# Patient Record
Sex: Male | Born: 1935 | Race: White | Hispanic: No | Smoking: Former smoker
Health system: Southern US, Community
[De-identification: ages and names within clinical notes are randomized; demographics above are authoritative.]

## PROBLEM LIST (undated history)

## (undated) DIAGNOSIS — K649 Unspecified hemorrhoids: Secondary | ICD-10-CM

## (undated) DIAGNOSIS — K224 Dyskinesia of esophagus: Secondary | ICD-10-CM

## (undated) DIAGNOSIS — Z8719 Personal history of other diseases of the digestive system: Secondary | ICD-10-CM

## (undated) DIAGNOSIS — I4891 Unspecified atrial fibrillation: Secondary | ICD-10-CM

## (undated) DIAGNOSIS — G4730 Sleep apnea, unspecified: Secondary | ICD-10-CM

## (undated) DIAGNOSIS — K219 Gastro-esophageal reflux disease without esophagitis: Secondary | ICD-10-CM

## (undated) DIAGNOSIS — C439 Malignant melanoma of skin, unspecified: Secondary | ICD-10-CM

## (undated) DIAGNOSIS — E785 Hyperlipidemia, unspecified: Secondary | ICD-10-CM

## (undated) DIAGNOSIS — F329 Major depressive disorder, single episode, unspecified: Secondary | ICD-10-CM

## (undated) DIAGNOSIS — F3289 Other specified depressive episodes: Secondary | ICD-10-CM

## (undated) DIAGNOSIS — K449 Diaphragmatic hernia without obstruction or gangrene: Secondary | ICD-10-CM

## (undated) DIAGNOSIS — K2990 Gastroduodenitis, unspecified, without bleeding: Secondary | ICD-10-CM

## (undated) DIAGNOSIS — K2980 Duodenitis without bleeding: Secondary | ICD-10-CM

## (undated) DIAGNOSIS — K589 Irritable bowel syndrome without diarrhea: Secondary | ICD-10-CM

## (undated) DIAGNOSIS — G609 Hereditary and idiopathic neuropathy, unspecified: Secondary | ICD-10-CM

## (undated) DIAGNOSIS — K2270 Barrett's esophagus without dysplasia: Secondary | ICD-10-CM

## (undated) DIAGNOSIS — I10 Essential (primary) hypertension: Secondary | ICD-10-CM

## (undated) DIAGNOSIS — D649 Anemia, unspecified: Secondary | ICD-10-CM

## (undated) DIAGNOSIS — F411 Generalized anxiety disorder: Secondary | ICD-10-CM

## (undated) DIAGNOSIS — K5730 Diverticulosis of large intestine without perforation or abscess without bleeding: Secondary | ICD-10-CM

## (undated) DIAGNOSIS — Z9889 Other specified postprocedural states: Secondary | ICD-10-CM

## (undated) DIAGNOSIS — K2970 Gastritis, unspecified, without bleeding: Secondary | ICD-10-CM

## (undated) DIAGNOSIS — Z7901 Long term (current) use of anticoagulants: Secondary | ICD-10-CM

## (undated) HISTORY — DX: Barrett's esophagus without dysplasia: K22.70

## (undated) HISTORY — PX: TURP VAPORIZATION: SUR1397

## (undated) HISTORY — DX: Personal history of other diseases of the digestive system: Z87.19

## (undated) HISTORY — DX: Duodenitis without bleeding: K29.80

## (undated) HISTORY — DX: Gastroduodenitis, unspecified, without bleeding: K29.90

## (undated) HISTORY — DX: Generalized anxiety disorder: F41.1

## (undated) HISTORY — DX: Long term (current) use of anticoagulants: Z79.01

## (undated) HISTORY — PX: ESOPHAGOGASTRODUODENOSCOPY: SHX1529

## (undated) HISTORY — PX: TONSILLECTOMY: SUR1361

## (undated) HISTORY — DX: Hereditary and idiopathic neuropathy, unspecified: G60.9

## (undated) HISTORY — DX: Dyskinesia of esophagus: K22.4

## (undated) HISTORY — DX: Gastritis, unspecified, without bleeding: K29.70

## (undated) HISTORY — DX: Sleep apnea, unspecified: G47.30

## (undated) HISTORY — DX: Diverticulosis of large intestine without perforation or abscess without bleeding: K57.30

## (undated) HISTORY — DX: Major depressive disorder, single episode, unspecified: F32.9

## (undated) HISTORY — PX: VASECTOMY: SHX75

## (undated) HISTORY — DX: Unspecified hemorrhoids: K64.9

## (undated) HISTORY — DX: Essential (primary) hypertension: I10

## (undated) HISTORY — DX: Unspecified atrial fibrillation: I48.91

## (undated) HISTORY — DX: Gastro-esophageal reflux disease without esophagitis: K21.9

## (undated) HISTORY — DX: Diaphragmatic hernia without obstruction or gangrene: K44.9

## (undated) HISTORY — DX: Anemia, unspecified: D64.9

## (undated) HISTORY — PX: LUMBAR LAMINECTOMY: SHX95

## (undated) HISTORY — PX: COLONOSCOPY: SHX174

## (undated) HISTORY — DX: Hyperlipidemia, unspecified: E78.5

## (undated) HISTORY — PX: OTHER SURGICAL HISTORY: SHX169

## (undated) HISTORY — DX: Other specified postprocedural states: Z98.890

## (undated) HISTORY — DX: Irritable bowel syndrome, unspecified: K58.9

## (undated) HISTORY — DX: Other specified depressive episodes: F32.89

## (undated) HISTORY — DX: Malignant melanoma of skin, unspecified: C43.9

## (undated) SURGERY — SIGMOIDOSCOPY, FLEXIBLE
Anesthesia: Moderate Sedation

---
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
## 1980-03-31 HISTORY — PX: NISSEN FUNDOPLICATION: SHX2091

## 1989-03-31 HISTORY — PX: CARDIAC CATHETERIZATION: SHX172

## 1998-10-25 ENCOUNTER — Encounter (INDEPENDENT_AMBULATORY_CARE_PROVIDER_SITE_OTHER): Payer: Self-pay | Admitting: Specialist

## 1998-10-25 ENCOUNTER — Other Ambulatory Visit: Admission: RE | Admit: 1998-10-25 | Discharge: 1998-10-25 | Payer: Self-pay | Admitting: Gastroenterology

## 2000-02-07 ENCOUNTER — Encounter (INDEPENDENT_AMBULATORY_CARE_PROVIDER_SITE_OTHER): Payer: Self-pay | Admitting: Specialist

## 2000-02-07 ENCOUNTER — Other Ambulatory Visit: Admission: RE | Admit: 2000-02-07 | Discharge: 2000-02-07 | Payer: Self-pay | Admitting: Gastroenterology

## 2000-02-08 ENCOUNTER — Emergency Department (HOSPITAL_COMMUNITY): Admission: EM | Admit: 2000-02-08 | Discharge: 2000-02-08 | Payer: Self-pay | Admitting: Emergency Medicine

## 2000-02-24 ENCOUNTER — Encounter: Payer: Self-pay | Admitting: Gastroenterology

## 2000-02-24 ENCOUNTER — Ambulatory Visit (HOSPITAL_COMMUNITY): Admission: RE | Admit: 2000-02-24 | Discharge: 2000-02-24 | Payer: Self-pay | Admitting: Gastroenterology

## 2001-02-01 ENCOUNTER — Encounter: Admission: RE | Admit: 2001-02-01 | Discharge: 2001-02-01 | Payer: Self-pay | Admitting: Internal Medicine

## 2001-02-01 ENCOUNTER — Encounter: Payer: Self-pay | Admitting: Internal Medicine

## 2001-02-23 ENCOUNTER — Ambulatory Visit (HOSPITAL_COMMUNITY): Admission: RE | Admit: 2001-02-23 | Discharge: 2001-02-23 | Payer: Self-pay | Admitting: Neurosurgery

## 2001-03-31 HISTORY — PX: CARPAL TUNNEL RELEASE: SHX101

## 2001-04-22 ENCOUNTER — Ambulatory Visit (HOSPITAL_COMMUNITY): Admission: RE | Admit: 2001-04-22 | Discharge: 2001-04-22 | Payer: Self-pay | Admitting: Neurosurgery

## 2001-05-19 ENCOUNTER — Encounter: Payer: Self-pay | Admitting: Gastroenterology

## 2001-05-19 ENCOUNTER — Ambulatory Visit (HOSPITAL_COMMUNITY): Admission: RE | Admit: 2001-05-19 | Discharge: 2001-05-19 | Payer: Self-pay | Admitting: Gastroenterology

## 2002-01-06 ENCOUNTER — Ambulatory Visit (HOSPITAL_COMMUNITY): Admission: RE | Admit: 2002-01-06 | Discharge: 2002-01-06 | Payer: Self-pay | Admitting: Neurosurgery

## 2002-01-15 ENCOUNTER — Emergency Department (HOSPITAL_COMMUNITY): Admission: EM | Admit: 2002-01-15 | Discharge: 2002-01-15 | Payer: Self-pay | Admitting: Emergency Medicine

## 2003-02-08 ENCOUNTER — Ambulatory Visit (HOSPITAL_COMMUNITY): Admission: RE | Admit: 2003-02-08 | Discharge: 2003-02-08 | Payer: Self-pay | Admitting: Cardiology

## 2003-04-19 ENCOUNTER — Encounter: Admission: RE | Admit: 2003-04-19 | Discharge: 2003-04-19 | Payer: Self-pay | Admitting: Orthopedic Surgery

## 2003-04-20 ENCOUNTER — Ambulatory Visit (HOSPITAL_COMMUNITY): Admission: RE | Admit: 2003-04-20 | Discharge: 2003-04-20 | Payer: Self-pay | Admitting: Orthopedic Surgery

## 2003-04-20 ENCOUNTER — Ambulatory Visit (HOSPITAL_BASED_OUTPATIENT_CLINIC_OR_DEPARTMENT_OTHER): Admission: RE | Admit: 2003-04-20 | Discharge: 2003-04-20 | Payer: Self-pay | Admitting: Orthopedic Surgery

## 2003-06-21 ENCOUNTER — Ambulatory Visit (HOSPITAL_COMMUNITY): Admission: RE | Admit: 2003-06-21 | Discharge: 2003-06-21 | Payer: Self-pay | Admitting: Cardiology

## 2003-06-21 ENCOUNTER — Ambulatory Visit (HOSPITAL_BASED_OUTPATIENT_CLINIC_OR_DEPARTMENT_OTHER): Admission: RE | Admit: 2003-06-21 | Discharge: 2003-06-21 | Payer: Self-pay | Admitting: Orthopedic Surgery

## 2003-06-26 ENCOUNTER — Ambulatory Visit (HOSPITAL_COMMUNITY): Admission: RE | Admit: 2003-06-26 | Discharge: 2003-06-26 | Payer: Self-pay | Admitting: Cardiology

## 2004-01-10 ENCOUNTER — Ambulatory Visit (HOSPITAL_COMMUNITY): Admission: RE | Admit: 2004-01-10 | Discharge: 2004-01-10 | Payer: Self-pay | Admitting: Cardiology

## 2004-02-19 ENCOUNTER — Ambulatory Visit: Payer: Self-pay | Admitting: Cardiology

## 2004-07-30 ENCOUNTER — Ambulatory Visit: Payer: Self-pay | Admitting: Cardiology

## 2004-07-31 ENCOUNTER — Ambulatory Visit: Payer: Self-pay | Admitting: Cardiology

## 2004-08-07 ENCOUNTER — Ambulatory Visit: Payer: Self-pay | Admitting: Gastroenterology

## 2004-08-23 ENCOUNTER — Ambulatory Visit: Payer: Self-pay | Admitting: Cardiology

## 2005-01-30 ENCOUNTER — Ambulatory Visit: Payer: Self-pay | Admitting: Cardiology

## 2005-07-29 ENCOUNTER — Ambulatory Visit: Payer: Self-pay | Admitting: Cardiology

## 2005-08-18 ENCOUNTER — Encounter: Payer: Self-pay | Admitting: Internal Medicine

## 2005-08-18 ENCOUNTER — Ambulatory Visit: Payer: Self-pay

## 2005-08-22 ENCOUNTER — Ambulatory Visit: Payer: Self-pay | Admitting: Gastroenterology

## 2005-09-09 ENCOUNTER — Ambulatory Visit: Payer: Self-pay | Admitting: Gastroenterology

## 2005-09-09 ENCOUNTER — Encounter (INDEPENDENT_AMBULATORY_CARE_PROVIDER_SITE_OTHER): Payer: Self-pay | Admitting: Specialist

## 2005-09-09 DIAGNOSIS — K2270 Barrett's esophagus without dysplasia: Secondary | ICD-10-CM | POA: Insufficient documentation

## 2005-10-06 ENCOUNTER — Ambulatory Visit: Payer: Self-pay | Admitting: Gastroenterology

## 2006-02-24 ENCOUNTER — Ambulatory Visit: Payer: Self-pay | Admitting: Cardiology

## 2006-05-25 ENCOUNTER — Ambulatory Visit: Payer: Self-pay | Admitting: Gastroenterology

## 2006-06-02 ENCOUNTER — Ambulatory Visit: Payer: Self-pay | Admitting: Gastroenterology

## 2006-07-08 ENCOUNTER — Ambulatory Visit: Payer: Self-pay | Admitting: Gastroenterology

## 2006-10-09 ENCOUNTER — Encounter: Payer: Self-pay | Admitting: Internal Medicine

## 2006-10-09 ENCOUNTER — Encounter: Admission: RE | Admit: 2006-10-09 | Discharge: 2006-10-09 | Payer: Self-pay | Admitting: Otolaryngology

## 2006-12-01 ENCOUNTER — Ambulatory Visit: Payer: Self-pay | Admitting: Internal Medicine

## 2006-12-03 ENCOUNTER — Ambulatory Visit: Payer: Self-pay | Admitting: Internal Medicine

## 2006-12-08 ENCOUNTER — Ambulatory Visit: Payer: Self-pay | Admitting: Internal Medicine

## 2006-12-09 ENCOUNTER — Ambulatory Visit (HOSPITAL_COMMUNITY): Admission: RE | Admit: 2006-12-09 | Discharge: 2006-12-09 | Payer: Self-pay | Admitting: Internal Medicine

## 2006-12-09 ENCOUNTER — Encounter: Admission: RE | Admit: 2006-12-09 | Discharge: 2006-12-09 | Payer: Self-pay | Admitting: Internal Medicine

## 2006-12-10 ENCOUNTER — Encounter: Admission: RE | Admit: 2006-12-10 | Discharge: 2006-12-10 | Payer: Self-pay | Admitting: Specialist

## 2006-12-15 ENCOUNTER — Ambulatory Visit (HOSPITAL_BASED_OUTPATIENT_CLINIC_OR_DEPARTMENT_OTHER): Admission: RE | Admit: 2006-12-15 | Discharge: 2006-12-15 | Payer: Self-pay | Admitting: Specialist

## 2007-01-14 ENCOUNTER — Ambulatory Visit: Payer: Self-pay | Admitting: Internal Medicine

## 2007-02-02 ENCOUNTER — Ambulatory Visit: Payer: Self-pay | Admitting: Cardiology

## 2007-06-04 DIAGNOSIS — K589 Irritable bowel syndrome without diarrhea: Secondary | ICD-10-CM

## 2007-06-04 DIAGNOSIS — F411 Generalized anxiety disorder: Secondary | ICD-10-CM | POA: Insufficient documentation

## 2007-06-04 DIAGNOSIS — I10 Essential (primary) hypertension: Secondary | ICD-10-CM

## 2007-06-04 DIAGNOSIS — F329 Major depressive disorder, single episode, unspecified: Secondary | ICD-10-CM

## 2007-06-04 DIAGNOSIS — E785 Hyperlipidemia, unspecified: Secondary | ICD-10-CM

## 2007-08-10 ENCOUNTER — Ambulatory Visit: Payer: Self-pay

## 2007-08-10 ENCOUNTER — Encounter: Payer: Self-pay | Admitting: Cardiology

## 2007-08-12 ENCOUNTER — Ambulatory Visit: Payer: Self-pay | Admitting: Cardiology

## 2007-08-16 ENCOUNTER — Ambulatory Visit: Payer: Self-pay | Admitting: Cardiology

## 2007-08-16 LAB — CONVERTED CEMR LAB
AST: 30 units/L (ref 0–37)
Albumin: 3.9 g/dL (ref 3.5–5.2)
BUN: 17 mg/dL (ref 6–23)
Basophils Relative: 0.8 % (ref 0.0–1.0)
Bilirubin, Direct: 0.1 mg/dL (ref 0.0–0.3)
CO2: 31 meq/L (ref 19–32)
Calcium: 9.2 mg/dL (ref 8.4–10.5)
Creatinine, Ser: 0.9 mg/dL (ref 0.4–1.5)
Eosinophils Relative: 3.7 % (ref 0.0–5.0)
GFR calc Af Amer: 105 mL/min
GFR calc non Af Amer: 87 mL/min
HCT: 42.4 % (ref 39.0–52.0)
Hemoglobin: 14.2 g/dL (ref 13.0–17.0)
Lymphocytes Relative: 28.3 % (ref 12.0–46.0)
Monocytes Relative: 8 % (ref 3.0–12.0)
Platelets: 250 10*3/uL (ref 150–400)
Potassium: 4.5 meq/L (ref 3.5–5.1)
RBC: 4.34 M/uL (ref 4.22–5.81)
Sodium: 143 meq/L (ref 135–145)
TSH: 0.55 microintl units/mL (ref 0.35–5.50)
Total Protein: 6.3 g/dL (ref 6.0–8.3)
WBC: 4.3 10*3/uL — ABNORMAL LOW (ref 4.5–10.5)

## 2007-08-24 ENCOUNTER — Ambulatory Visit: Payer: Self-pay | Admitting: Cardiology

## 2007-09-03 ENCOUNTER — Ambulatory Visit: Payer: Self-pay | Admitting: Cardiovascular Disease

## 2007-09-13 ENCOUNTER — Ambulatory Visit: Payer: Self-pay | Admitting: Internal Medicine

## 2007-09-21 ENCOUNTER — Ambulatory Visit: Payer: Self-pay | Admitting: Cardiology

## 2007-09-27 ENCOUNTER — Ambulatory Visit: Payer: Self-pay | Admitting: Cardiology

## 2007-10-19 ENCOUNTER — Ambulatory Visit: Payer: Self-pay | Admitting: Cardiology

## 2007-10-28 ENCOUNTER — Ambulatory Visit: Payer: Self-pay | Admitting: Cardiology

## 2007-11-02 ENCOUNTER — Ambulatory Visit: Payer: Self-pay | Admitting: Cardiology

## 2007-11-04 ENCOUNTER — Ambulatory Visit: Payer: Self-pay | Admitting: Internal Medicine

## 2007-11-15 ENCOUNTER — Ambulatory Visit: Payer: Self-pay | Admitting: Cardiology

## 2007-11-22 ENCOUNTER — Ambulatory Visit: Payer: Self-pay | Admitting: Internal Medicine

## 2007-11-23 ENCOUNTER — Ambulatory Visit: Payer: Self-pay | Admitting: Cardiology

## 2007-12-02 ENCOUNTER — Ambulatory Visit: Payer: Self-pay | Admitting: Internal Medicine

## 2007-12-23 ENCOUNTER — Ambulatory Visit: Payer: Self-pay | Admitting: Cardiology

## 2007-12-30 ENCOUNTER — Inpatient Hospital Stay (HOSPITAL_COMMUNITY): Admission: RE | Admit: 2007-12-30 | Discharge: 2008-01-03 | Payer: Self-pay | Admitting: Specialist

## 2007-12-30 ENCOUNTER — Encounter: Payer: Self-pay | Admitting: Cardiology

## 2008-01-11 ENCOUNTER — Encounter: Admission: RE | Admit: 2008-01-11 | Discharge: 2008-01-11 | Payer: Self-pay | Admitting: Specialist

## 2008-01-24 ENCOUNTER — Ambulatory Visit: Payer: Self-pay | Admitting: Cardiology

## 2008-01-31 ENCOUNTER — Encounter (INDEPENDENT_AMBULATORY_CARE_PROVIDER_SITE_OTHER): Payer: Self-pay | Admitting: Specialist

## 2008-01-31 ENCOUNTER — Ambulatory Visit: Payer: Self-pay | Admitting: Vascular Surgery

## 2008-01-31 ENCOUNTER — Ambulatory Visit: Payer: Self-pay | Admitting: Cardiovascular Disease

## 2008-01-31 ENCOUNTER — Ambulatory Visit: Admission: RE | Admit: 2008-01-31 | Discharge: 2008-01-31 | Payer: Self-pay | Admitting: Specialist

## 2008-02-21 ENCOUNTER — Encounter: Admission: RE | Admit: 2008-02-21 | Discharge: 2008-02-21 | Payer: Self-pay | Admitting: Specialist

## 2008-02-29 ENCOUNTER — Ambulatory Visit: Payer: Self-pay | Admitting: Cardiovascular Disease

## 2008-03-01 ENCOUNTER — Ambulatory Visit: Payer: Self-pay | Admitting: Internal Medicine

## 2008-03-01 ENCOUNTER — Encounter: Admission: RE | Admit: 2008-03-01 | Discharge: 2008-03-01 | Payer: Self-pay | Admitting: Specialist

## 2008-03-03 ENCOUNTER — Ambulatory Visit: Payer: Self-pay | Admitting: Cardiology

## 2008-03-09 ENCOUNTER — Ambulatory Visit: Payer: Self-pay | Admitting: Cardiology

## 2008-03-16 ENCOUNTER — Ambulatory Visit: Payer: Self-pay | Admitting: Cardiology

## 2008-03-21 ENCOUNTER — Ambulatory Visit: Payer: Self-pay | Admitting: Cardiovascular Disease

## 2008-04-05 ENCOUNTER — Ambulatory Visit: Payer: Self-pay | Admitting: Cardiology

## 2008-04-11 ENCOUNTER — Ambulatory Visit: Payer: Self-pay | Admitting: Cardiovascular Disease

## 2008-04-25 ENCOUNTER — Ambulatory Visit: Payer: Self-pay | Admitting: Cardiology

## 2008-05-09 ENCOUNTER — Ambulatory Visit: Payer: Self-pay | Admitting: Cardiology

## 2008-05-18 ENCOUNTER — Ambulatory Visit: Payer: Self-pay | Admitting: Internal Medicine

## 2008-05-25 ENCOUNTER — Ambulatory Visit: Payer: Self-pay | Admitting: Internal Medicine

## 2008-06-06 ENCOUNTER — Ambulatory Visit: Payer: Self-pay | Admitting: Cardiology

## 2008-06-15 ENCOUNTER — Ambulatory Visit: Payer: Self-pay | Admitting: Internal Medicine

## 2008-06-20 ENCOUNTER — Ambulatory Visit: Payer: Self-pay | Admitting: Cardiovascular Disease

## 2008-06-26 ENCOUNTER — Ambulatory Visit: Payer: Self-pay | Admitting: Cardiology

## 2008-06-28 ENCOUNTER — Ambulatory Visit: Payer: Self-pay | Admitting: Internal Medicine

## 2008-06-28 ENCOUNTER — Telehealth: Payer: Self-pay | Admitting: Internal Medicine

## 2008-06-28 DIAGNOSIS — K224 Dyskinesia of esophagus: Secondary | ICD-10-CM | POA: Insufficient documentation

## 2008-07-06 ENCOUNTER — Encounter: Payer: Self-pay | Admitting: Internal Medicine

## 2008-07-10 ENCOUNTER — Ambulatory Visit: Payer: Self-pay | Admitting: Internal Medicine

## 2008-07-11 DIAGNOSIS — G4730 Sleep apnea, unspecified: Secondary | ICD-10-CM | POA: Insufficient documentation

## 2008-07-12 ENCOUNTER — Encounter: Payer: Self-pay | Admitting: Cardiology

## 2008-07-12 ENCOUNTER — Ambulatory Visit: Payer: Self-pay | Admitting: Cardiology

## 2008-07-20 ENCOUNTER — Ambulatory Visit: Payer: Self-pay | Admitting: Internal Medicine

## 2008-07-20 ENCOUNTER — Encounter: Payer: Self-pay | Admitting: Internal Medicine

## 2008-07-25 ENCOUNTER — Encounter: Payer: Self-pay | Admitting: Internal Medicine

## 2008-07-31 ENCOUNTER — Ambulatory Visit: Payer: Self-pay | Admitting: Cardiology

## 2008-08-10 ENCOUNTER — Ambulatory Visit: Payer: Self-pay | Admitting: Cardiovascular Disease

## 2008-08-30 ENCOUNTER — Encounter: Payer: Self-pay | Admitting: *Deleted

## 2008-08-31 ENCOUNTER — Ambulatory Visit: Payer: Self-pay | Admitting: Internal Medicine

## 2008-09-28 ENCOUNTER — Ambulatory Visit: Payer: Self-pay | Admitting: Cardiology

## 2008-10-04 ENCOUNTER — Encounter: Payer: Self-pay | Admitting: *Deleted

## 2008-10-04 ENCOUNTER — Telehealth: Payer: Self-pay | Admitting: Cardiology

## 2008-10-16 ENCOUNTER — Ambulatory Visit: Payer: Self-pay | Admitting: Cardiology

## 2008-10-16 LAB — CONVERTED CEMR LAB: POC INR: 1.6

## 2008-10-26 ENCOUNTER — Ambulatory Visit: Payer: Self-pay | Admitting: Cardiovascular Disease

## 2008-10-26 LAB — CONVERTED CEMR LAB: Prothrombin Time: 18.8 s

## 2008-10-27 ENCOUNTER — Encounter: Payer: Self-pay | Admitting: Internal Medicine

## 2008-10-27 ENCOUNTER — Encounter: Payer: Self-pay | Admitting: Cardiology

## 2008-11-06 ENCOUNTER — Ambulatory Visit: Payer: Self-pay | Admitting: Cardiology

## 2008-11-16 ENCOUNTER — Telehealth: Payer: Self-pay | Admitting: Cardiology

## 2008-11-21 ENCOUNTER — Telehealth: Payer: Self-pay | Admitting: Internal Medicine

## 2008-12-05 ENCOUNTER — Encounter: Payer: Self-pay | Admitting: Cardiology

## 2008-12-05 ENCOUNTER — Ambulatory Visit: Payer: Self-pay | Admitting: Cardiology

## 2008-12-05 LAB — CONVERTED CEMR LAB: POC INR: 1.5

## 2008-12-19 ENCOUNTER — Ambulatory Visit: Payer: Self-pay | Admitting: Cardiology

## 2008-12-19 LAB — CONVERTED CEMR LAB: POC INR: 2.9

## 2009-01-16 ENCOUNTER — Ambulatory Visit: Payer: Self-pay | Admitting: Cardiology

## 2009-01-17 ENCOUNTER — Telehealth: Payer: Self-pay | Admitting: Cardiology

## 2009-01-24 ENCOUNTER — Ambulatory Visit: Payer: Self-pay | Admitting: Cardiology

## 2009-01-24 DIAGNOSIS — I080 Rheumatic disorders of both mitral and aortic valves: Secondary | ICD-10-CM

## 2009-01-25 ENCOUNTER — Ambulatory Visit: Payer: Self-pay | Admitting: Cardiology

## 2009-02-02 ENCOUNTER — Ambulatory Visit: Payer: Self-pay | Admitting: Cardiovascular Disease

## 2009-02-02 LAB — CONVERTED CEMR LAB: POC INR: 1.8

## 2009-02-16 ENCOUNTER — Ambulatory Visit: Payer: Self-pay | Admitting: Cardiology

## 2009-02-28 DEATH — deceased

## 2009-03-02 ENCOUNTER — Ambulatory Visit: Payer: Self-pay | Admitting: Cardiovascular Disease

## 2009-03-12 ENCOUNTER — Encounter: Payer: Self-pay | Admitting: Cardiology

## 2009-03-16 ENCOUNTER — Ambulatory Visit: Payer: Self-pay | Admitting: Cardiovascular Disease

## 2009-03-16 LAB — CONVERTED CEMR LAB: POC INR: 2.4

## 2009-04-12 ENCOUNTER — Ambulatory Visit: Payer: Self-pay | Admitting: Internal Medicine

## 2009-04-12 LAB — CONVERTED CEMR LAB: POC INR: 2.5

## 2009-04-26 ENCOUNTER — Telehealth: Payer: Self-pay | Admitting: Cardiology

## 2009-05-14 ENCOUNTER — Ambulatory Visit: Payer: Self-pay | Admitting: Cardiology

## 2009-05-14 LAB — CONVERTED CEMR LAB: POC INR: 2.7

## 2009-06-12 ENCOUNTER — Ambulatory Visit: Payer: Self-pay | Admitting: Cardiology

## 2009-06-29 ENCOUNTER — Ambulatory Visit: Payer: Self-pay | Admitting: Internal Medicine

## 2009-07-03 ENCOUNTER — Telehealth: Payer: Self-pay | Admitting: Cardiology

## 2009-07-09 ENCOUNTER — Ambulatory Visit: Payer: Self-pay | Admitting: Cardiovascular Disease

## 2009-07-16 ENCOUNTER — Ambulatory Visit: Payer: Self-pay | Admitting: Cardiology

## 2009-07-31 ENCOUNTER — Ambulatory Visit: Payer: Self-pay | Admitting: Cardiology

## 2009-07-31 ENCOUNTER — Encounter: Payer: Self-pay | Admitting: Cardiology

## 2009-07-31 ENCOUNTER — Ambulatory Visit: Payer: Self-pay | Admitting: Cardiovascular Disease

## 2009-07-31 ENCOUNTER — Ambulatory Visit: Payer: Self-pay

## 2009-07-31 ENCOUNTER — Ambulatory Visit (HOSPITAL_COMMUNITY): Admission: RE | Admit: 2009-07-31 | Discharge: 2009-07-31 | Payer: Self-pay | Admitting: Cardiology

## 2009-07-31 LAB — CONVERTED CEMR LAB: POC INR: 3.1

## 2009-08-13 ENCOUNTER — Ambulatory Visit: Payer: Self-pay | Admitting: Cardiovascular Disease

## 2009-08-13 LAB — CONVERTED CEMR LAB: POC INR: 2.5

## 2009-09-03 ENCOUNTER — Ambulatory Visit: Payer: Self-pay | Admitting: Cardiology

## 2009-09-03 LAB — CONVERTED CEMR LAB: POC INR: 2.1

## 2009-10-02 ENCOUNTER — Ambulatory Visit: Payer: Self-pay | Admitting: Internal Medicine

## 2009-10-02 LAB — CONVERTED CEMR LAB: POC INR: 2.1

## 2009-11-09 ENCOUNTER — Ambulatory Visit: Payer: Self-pay | Admitting: Internal Medicine

## 2009-12-07 ENCOUNTER — Ambulatory Visit: Payer: Self-pay | Admitting: Internal Medicine

## 2009-12-07 LAB — CONVERTED CEMR LAB: POC INR: 3.3

## 2009-12-10 ENCOUNTER — Ambulatory Visit: Payer: Self-pay | Admitting: Cardiology

## 2009-12-10 DIAGNOSIS — R609 Edema, unspecified: Secondary | ICD-10-CM

## 2009-12-14 ENCOUNTER — Telehealth: Payer: Self-pay | Admitting: Cardiology

## 2009-12-20 ENCOUNTER — Ambulatory Visit: Payer: Self-pay | Admitting: Cardiology

## 2009-12-20 DIAGNOSIS — I5032 Chronic diastolic (congestive) heart failure: Secondary | ICD-10-CM | POA: Insufficient documentation

## 2009-12-20 DIAGNOSIS — I4891 Unspecified atrial fibrillation: Secondary | ICD-10-CM | POA: Insufficient documentation

## 2009-12-21 LAB — CONVERTED CEMR LAB
Calcium: 9.2 mg/dL (ref 8.4–10.5)
Creatinine, Ser: 0.9 mg/dL (ref 0.4–1.5)
Sodium: 143 meq/L (ref 135–145)

## 2010-01-07 ENCOUNTER — Ambulatory Visit: Payer: Self-pay | Admitting: Cardiology

## 2010-01-07 LAB — CONVERTED CEMR LAB: POC INR: 2.3

## 2010-01-09 LAB — CONVERTED CEMR LAB
Calcium: 8.9 mg/dL (ref 8.4–10.5)
GFR calc non Af Amer: 82.99 mL/min (ref >60)
Potassium: 3.7 meq/L (ref 3.5–5.1)
Sodium: 140 meq/L (ref 135–145)

## 2010-01-11 ENCOUNTER — Ambulatory Visit: Payer: Self-pay | Admitting: Cardiology

## 2010-01-11 DIAGNOSIS — R7309 Other abnormal glucose: Secondary | ICD-10-CM | POA: Insufficient documentation

## 2010-01-11 LAB — CONVERTED CEMR LAB: Glucose, Bld: 76 mg/dL (ref 70–99)

## 2010-01-12 ENCOUNTER — Encounter: Admission: RE | Admit: 2010-01-12 | Discharge: 2010-01-12 | Payer: Self-pay | Admitting: Specialist

## 2010-01-28 ENCOUNTER — Telehealth: Payer: Self-pay | Admitting: Cardiology

## 2010-01-30 ENCOUNTER — Ambulatory Visit: Payer: Self-pay | Admitting: Cardiology

## 2010-01-31 ENCOUNTER — Encounter: Payer: Self-pay | Admitting: Cardiology

## 2010-02-11 ENCOUNTER — Ambulatory Visit: Payer: Self-pay | Admitting: Internal Medicine

## 2010-02-11 LAB — CONVERTED CEMR LAB: POC INR: 1.3

## 2010-02-18 ENCOUNTER — Ambulatory Visit: Payer: Self-pay | Admitting: Cardiovascular Disease

## 2010-02-18 LAB — CONVERTED CEMR LAB: POC INR: 2.3

## 2010-02-26 ENCOUNTER — Encounter: Payer: Self-pay | Admitting: Cardiology

## 2010-03-18 ENCOUNTER — Ambulatory Visit: Payer: Self-pay | Admitting: Internal Medicine

## 2010-04-05 ENCOUNTER — Ambulatory Visit: Admission: RE | Admit: 2010-04-05 | Discharge: 2010-04-05 | Payer: Self-pay | Source: Home / Self Care

## 2010-04-05 ENCOUNTER — Encounter: Payer: Self-pay | Admitting: Cardiology

## 2010-04-05 ENCOUNTER — Ambulatory Visit
Admission: RE | Admit: 2010-04-05 | Discharge: 2010-04-05 | Payer: Self-pay | Source: Home / Self Care | Attending: Cardiology | Admitting: Cardiology

## 2010-04-05 LAB — CONVERTED CEMR LAB: POC INR: 1.4

## 2010-04-19 ENCOUNTER — Ambulatory Visit: Admit: 2010-04-19 | Payer: Self-pay

## 2010-04-22 ENCOUNTER — Encounter: Payer: Self-pay | Admitting: Internal Medicine

## 2010-04-22 ENCOUNTER — Ambulatory Visit
Admission: RE | Admit: 2010-04-22 | Discharge: 2010-04-22 | Payer: Self-pay | Source: Home / Self Care | Attending: Cardiology | Admitting: Cardiology

## 2010-04-30 NOTE — Miscellaneous (Signed)
Summary: Orders Update  Clinical Lists Changes  Orders: Added new Test order of TLB-BMP (Basic Metabolic Panel-BMET) (80048-METABOL) - Signed 

## 2010-04-30 NOTE — Progress Notes (Signed)
Summary: Pt have question about medication  Phone Note Call from Patient Call back at [phone]   Caller: Patient Summary of Call: Pt have question about medication Initial call taken by: Judie Grieve,  December 14, 2009 11:04 AM  Follow-up for Phone Call        Spoke with pt. Patient states on his last visit with Dr. Riley Kill Md gave  him 2 weeks worth on samples of Atacand HCT 16-12.5 mg. Pt. would like to know when is better for him to take this medication . I adviced pt. it will be better for him to take medication in the AM due that medication has a diuretic component. Pt. verbalized understanding. Follow-up by: Ollen Gross, RN, BSN,  December 14, 2009 11:51 AM

## 2010-04-30 NOTE — Medication Information (Signed)
Summary: rov coumadin - rov  Anticoagulant Therapy  Managed by: Bethena Midget, RN, BSN Referring MD: Shawnie Pons MD PCP: Ralene Ok, MD Supervising MD: Shirlee Latch MD, Romey Cohea Indication 1: Atrial Fibrillation (ICD-427.31) Lab Used: [facility] Site: [facility] INR POC 2.7 INR RANGE 2 - 3  Dietary changes: no    Health status changes: no    Bleeding/hemorrhagic complications: no    Recent/future hospitalizations: no    Any changes in medication regimen? no    Recent/future dental: no  Any missed doses?: no       Is patient compliant with meds? yes       Allergies: 1)  ! * Horse Serum  Anticoagulation Management History:      The patient is taking warfarin and comes in today for a routine follow up visit.  Positive risk factors for bleeding include an age of 75 years or older.  The bleeding index is 'intermediate risk'.  Positive CHADS2 values include History of HTN and Age > 31 years old.  The start date was 08/09/2007.  Anticoagulation responsible provider: Shirlee Latch MD, Tiffany Talarico.  INR POC: 2.7.  Cuvette Lot#: [id number].  Exp: 06/2009.    Anticoagulation Management Assessment/Plan:      The patient's current anticoagulation dose is Coumadin 5 mg tabs: Take as directed by coumadin clinic..  The target INR is 2 - 3.  The next INR is due 06/11/2009.  Anticoagulation instructions were given to patient.  Results were reviewed/authorized by Bethena Midget, RN, BSN.  He was notified by Bethena Midget, RN, BSN.         Prior Anticoagulation Instructions: INR 2.5  Continue taking same dose of 1 tablet daily except 0.5 tablet on Wednesdays. Recheck in 4 weeks.  Current Anticoagulation Instructions: INR 2.7 Continue 5mg s daily except 2.5mg s on Wednesdays. Recheck in 4 weeks.

## 2010-04-30 NOTE — Miscellaneous (Signed)
Summary: Orders Update  Clinical Lists Changes  Problems: Added new problem of CHF (ICD-428.0) Orders: Added new Test order of TLB-BMP (Basic Metabolic Panel-BMET) (80048-METABOL) - Signed

## 2010-04-30 NOTE — Progress Notes (Signed)
Summary: Question  Phone Note Call from Patient Call back at Home Phone [phone]   Caller: Patient Reason for Call: Talk to Nurse Details for Reason: personal call, no info  Initial call taken by: Lorne Skeens,  April 26, 2009 8:40 AM  Follow-up for Phone Call        I spoke with the pt and his wife passed away on 04-03-2009.  The pt is scheduled to have an epidural injection on Monday with Dr Otelia Sergeant.  The pt did start holding his coumadin yesterday.  The pt wanted to get our thoughts on wether he should proceed with injection at this time.  The pt has noticed that he has had more palpitations since his wife passed away.  The pt would like to post-pone this injection and restart his coumadin.  I told the pt that this would be appropriate.  The pt does monitor his BP and HR at home and these have been okay.  I asked the pt to call our office if he had any other problems. Follow-up by: Julieta Gutting, RN, BSN,  April 26, 2009 9:40 AM

## 2010-04-30 NOTE — Medication Information (Signed)
Summary: rov/ej  Anticoagulant Therapy  Managed by: Earvin Hansen, PharmD Referring MD: Shawnie Pons MD PCP: Ralene Ok, MD Supervising MD: Ladona Ridgel MD, Sharlot Gowda Indication 1: Atrial Fibrillation (ICD-427.31) Lab Used: [facility] Site: [facility] INR POC 1.3 INR RANGE 2 - 3  Dietary changes: no    Health status changes: no    Bleeding/hemorrhagic complications: no    Recent/future hospitalizations: no    Any changes in medication regimen? yes    Recent/future dental: no  Any missed doses?: yes     Details: Missed the last 5 days because receiving an epideral today   Comments: Patient was instructed by his cardiologist told him to hold it for 5 days prior to the epideral today. His procedure is at 3:30 today.  Allergies: 1)  ! * Horse Serum  Anticoagulation Management History:      Positive risk factors for bleeding include an age of 31 years or older.  The bleeding index is 'intermediate risk'.  Positive CHADS2 values include History of CHF, History of HTN, and Age > 75 years old.  The start date was 08/09/2007.  His last INR was 2.1.  Anticoagulation responsible provider: Ladona Ridgel MD, Sharlot Gowda.  INR POC: 1.3.  Exp: 01/2011.    Anticoagulation Management Assessment/Plan:      The patient's current anticoagulation dose is Coumadin 5 mg tabs: Take as directed by coumadin clinic..  The target INR is 2 - 3.  The next INR is due 02/18/2010.  Anticoagulation instructions were given to patient.  Results were reviewed/authorized by Earvin Hansen, PharmD.  He was notified by Earvin Hansen PharmD.         Prior Anticoagulation Instructions: INR 2.3  Continue Coumadin 1 tablet every day of the week, except 1/2 tablet on Wednesday.  Return to clinic in 4 weeks.   Current Anticoagulation Instructions: Take 2 tablets this evening. Then continue on regular regimen of 1 tablet daily except for 1/2 tablet on Thursday.

## 2010-04-30 NOTE — Assessment & Plan Note (Signed)
Summary: ROV   Visit Type:  Follow-up Primary Provider:  Ralene Ok, MD  CC:  Biilateral leg edema and pain. Swelling on feet better now.  History of Present Illness: His swelling has improved quite a bit.  It was getting uncomfortable, and is now alot better than it was.  He thinks his elevated sugar was about too much Rum and coke.  We discussed use of Atenolol today in light of glucose.  Still has back pain, and that helps, but he had to get off of oxycodone.  Now on Gabapentin.  BP at home remain a little bit high.  Dr. Ludwig Clarks told him he would like to keep it under 140.  Current Medications (verified): 1)  Atenolol 50 Mg Tabs (Atenolol) .... Take 1 Tablet By Mouth Once A Day 2)  Prilosec 20 Mg Cpdr (Omeprazole) .... Take 1 Tablet By Mouth Once A Day 3)  Pravastatin Sodium 10 Mg Tabs (Pravastatin Sodium) .... Take 1 Tablet By Mouth Once A Day 4)  Vitamin E 400 Unit Caps (Vitamin E) .... Take 1 Tablet By Mouth Once A Day 5)  Flax Seed Oil 1000 Mg Caps (Flaxseed (Linseed)) .... Take 2 Tablet By Mouth Once A Day 6)  Coumadin 5 Mg Tabs (Warfarin Sodium) .... Take As Directed By Coumadin Clinic. 7)  Vitamin D 1000 Unit Tabs (Cholecalciferol) .... One Tablet Once Daily 8)  Calcium 600 Mg Tabs (Calcium) .... Every Other Day 9)  B Complex  Tabs (B Complex Vitamins) .... Take 1 Tablet By Mouth Once A Day 10)  Vitamin C 500 Mg  Tabs (Ascorbic Acid) .... About 2 O 3 A Week 11)  Atacand Hct 16-12.5 Mg Tabs (Candesartan Cilexetil-Hctz) .... Take 1 Tablet Daily 12)  Gabapentin 300 Mg  Caps (Gabapentin) .... 2 Am and 2 Pm 13)  Tramadol Hcl 50 Mg  Tabs (Tramadol Hcl) .... As Needed 14)  Fish Oil 1000 Mg Caps (Omega-3 Fatty Acids) .Marland Kitchen.. 1 Cap Two Times A Day  Allergies: 1)  ! * Horse Serum  Vital Signs:  Patient profile:   77 year old male Height:      72 inches Weight:      183.50 pounds BMI:     24.98 Pulse rate:   88 / minute Pulse rhythm:   irregular Resp:     18 per minute BP sitting:    150 / 90  (left arm) Cuff size:   large  Vitals Entered By: Vikki Ports (January 11, 2010 10:13 AM)  Physical Exam  General:  Well developed, well nourished, in no acute distress. Head:  normocephalic and atraumatic Eyes:  PERRLA/EOM intact; conjunctiva and lids normal. Lungs:  Clear bilaterally to auscultation and percussion. Heart:  PMI non displaced.  Soft apical murmur.  Irreg irreg rate.   Abdomen:  Bowel sounds positive; abdomen soft and non-tender without masses, organomegaly, or hernias noted. No hepatosplenomegaly. Extremities:  trace edema bilaterally Neurologic:  Alert and oriented x 3.   Impression & Recommendations:  Problem # 1:  HYPERTENSION (ICD-401.9) BP are staying high.  Therefore, will change atenolol to carvedilol and titrate up, see back in early follow up to assess response.  Told him to call if problems given need for AF control as well.   The following medications were removed from the medication list:    Atacand Hct 16-12.5 Mg Tabs (Candesartan cilexetil-hctz) ..... Once daily His updated medication list for this problem includes:    Carvedilol 6.25 Mg Tabs (Carvedilol) .Marland Kitchen... Take  one tablet by mouth twice a day    Atacand Hct 16-12.5 Mg Tabs (Candesartan cilexetil-hctz) .Marland Kitchen... Take 1 tablet daily  Problem # 2:  EDEMA (ICD-782.3) Much improved off of Amlodipine.    Problem # 3:  ATRIAL FIBRILLATION (ICD-427.31)  controlled at present.  Changing rate blocking drug, so will need to monitor. His updated medication list for this problem includes:    Carvedilol 6.25 Mg Tabs (Carvedilol) .Marland Kitchen... Take one tablet by mouth twice a day    Coumadin 5 Mg Tabs (Warfarin sodium) .Marland Kitchen... Take as directed by coumadin clinic.  Orders: TLB-Glucose, QUANT ([id number]-GLU)  His updated medication list for this problem includes:    Atenolol 50 Mg Tabs (Atenolol) .Marland Kitchen... Take 1 tablet by mouth once a day    Coumadin 5 Mg Tabs (Warfarin sodium) .Marland Kitchen... Take as directed by coumadin  clinic.  Problem # 4:  MITRAL REGURGITATION (ICD-396.3) stable  Problem # 5:  HYPERGLYCEMIA (ICD-790.29) recheck labs today.  Add K to regimen based on last K of 3.7.  Patient Instructions: 1)  Your physician recommends that you schedule a follow-up appointment in: 2 WEEKS (pt will call for appt) 2)  Your physician recommends that you have lab work today: fasting glucose 3)  Your physician has recommended you make the following change in your medication: START Potassium Chloride once a day, STOP Atenolol, START Carvedilol 6.25mg  take one-half tablet by mouth two times a day for 2 days then increase to one tablet by mouth two times a day  Prescriptions: POTASSIUM CHLORIDE CR 10 MEQ CR-CAPS (POTASSIUM CHLORIDE) Take one tablet by mouth daily  #30 x 6   Entered by:   Julieta Gutting, RN, BSN   Authorized by:   Ronaldo Miyamoto, MD, [facility]   Signed by:   Julieta Gutting, RN, BSN on 01/11/2010   Method used:   Electronically to        [facility]  [phone]* (retail)       [address] [place], [place]  [address]       Ph: [phone] or [phone]       Fax: [phone]   RxID:   [id number] CARVEDILOL 6.25 MG TABS (CARVEDILOL) Take one tablet by mouth twice a day  #60 x 6   Entered by:   Julieta Gutting, RN, BSN   Authorized by:   Ronaldo Miyamoto, MD, [facility]   Signed by:   Julieta Gutting, RN, BSN on 01/11/2010   Method used:   Electronically to        [facility]  [phone]* (retail)       [address] [place], [place]  [address]       Ph: [phone] or [phone]       Fax: [phone]   RxID:   [id number]

## 2010-04-30 NOTE — Assessment & Plan Note (Signed)
Summary: ROV   Visit Type:  Follow-up Primary Provider:  Ralene Ok, MD  CC:  some palpitations.  History of Present Illness: Overall BP are reasonably controlled.  No chest pain.  HR remains 70-90s.  Needs another Bp injection.   Current Medications (verified): 1)  Carvedilol 6.25 Mg Tabs (Carvedilol) .... Take One Tablet By Mouth Twice A Day 2)  Prilosec 20 Mg Cpdr (Omeprazole) .... Take 1 Tablet By Mouth Once A Day 3)  Pravastatin Sodium 10 Mg Tabs (Pravastatin Sodium) .... Take 1 Tablet By Mouth Once A Day 4)  Vitamin E 400 Unit Caps (Vitamin E) .... Take 1 Tablet By Mouth Once A Day 5)  Coumadin 5 Mg Tabs (Warfarin Sodium) .... Take As Directed By Coumadin Clinic. 6)  Vitamin D 1000 Unit Tabs (Cholecalciferol) .... One Tablet Once Daily 7)  Calcium 600 Mg Tabs (Calcium) .... Every Other Day 8)  B Complex  Tabs (B Complex Vitamins) .... Take 1 Tablet By Mouth Once A Day 9)  Vitamin C 500 Mg  Tabs (Ascorbic Acid) .... About 2 O 3 A Week 10)  Atacand Hct 16-12.5 Mg Tabs (Candesartan Cilexetil-Hctz) .... Take 1 Tablet Daily 11)  Gabapentin 300 Mg  Caps (Gabapentin) .... 2 Tablets Am- 1 Tablet At Lunch and 2 Tablets Pm 12)  Tramadol Hcl 50 Mg  Tabs (Tramadol Hcl) .... As Needed 13)  Omega 3,6,9  1.000mg  .... 1 Cap Daily 14)  Potassium Chloride Cr 10 Meq Cr-Caps (Potassium Chloride) .... Take One Tablet By Mouth Daily 15)  Vitamin B-12 250 Mcg Tabs (Cyanocobalamin) .... Take 1 Tablet By Mouth Once A Day  Allergies: 1)  ! * Horse Serum  Vital Signs:  Patient profile:   77 year old male Height:      72 inches Weight:      186.25 pounds BMI:     25.35 Pulse rate:   77 / minute Pulse rhythm:   regular Resp:     18 per minute BP sitting:   140 / 88  (left arm) Cuff size:   large  Vitals Entered By: Vikki Ports (January 30, 2010 1:08 PM)  Physical Exam  General:  Well developed, well nourished, in no acute distress. Head:  normocephalic and atraumatic Eyes:  PERRLA/EOM  intact; conjunctiva and lids normal. Lungs:  Clear bilaterally to auscultation and percussion. Heart:  Irregularly irregular rhythm.  No murmur Pulses:  pulses normal in all 4 extremities Extremities:  No clubbing or cyanosis. Neurologic:  Alert and oriented x 3.   EKG  Procedure date:  01/30/2010  Findings:      Atrial fib, CVR.  RBBB. No ST abnormalities.   Impression & Recommendations:  Problem # 1:  ATRIAL FIBRILLATION (ICD-427.31) Will increase dose slightly, and try to control BP and HR a bit better.  His updated medication list for this problem includes:    Carvedilol 6.25 Mg Tabs (Carvedilol) .Marland Kitchen... Take one and one-half tablet by mouth twice a day    Coumadin 5 Mg Tabs (Warfarin sodium) .Marland Kitchen... Take as directed by coumadin clinic.  Orders: EKG w/ Interpretation (93000)  Problem # 2:  HYPERGLYCEMIA (ICD-790.29) rechecked and found to be ok.  Problem # 3:  HYPERTENSION (ICD-401.9)  His updated medication list for this problem includes:    Carvedilol 6.25 Mg Tabs (Carvedilol) .Marland Kitchen... Take one and one-half tablet by mouth twice a day    Atacand Hct 16-12.5 Mg Tabs (Candesartan cilexetil-hctz) .Marland Kitchen... Take 1 tablet daily  Patient Instructions:  1)  Your physician recommends that you schedule a follow-up appointment in: 2 MONTHS 2)  Your physician has recommended you make the following change in your medication: INCREASE Carvediolol to 6.25mg  take one and one-half tablet two times a day  3)  Per Dr Riley Kill the pt can hold Warfarin 5 days prior to injection [facility]) Prescriptions: CARVEDILOL 6.25 MG TABS (CARVEDILOL) Take one and one-half tablet by mouth twice a day  #90 x 6   Entered by:   Julieta Gutting, RN, BSN   Authorized by:   Ronaldo Miyamoto, MD, [facility]   Signed by:   Julieta Gutting, RN, BSN on 01/30/2010   Method used:   Electronically to        [facility]  [phone]* (retail)       [address] [place], [place]  [address]       Ph:  [phone] or [phone]       Fax: [phone]   RxID:   [id number]

## 2010-04-30 NOTE — Medication Information (Signed)
Summary: rov/ewj  Anticoagulant Therapy  Managed by: Cloyde Reams, RN, BSN Referring MD: Shawnie Pons MD PCP: Ralene Ok, MD Supervising MD: Tenny Craw MD, Gunnar Fusi Indication 1: Atrial Fibrillation (ICD-427.31) Lab Used: [facility] Site: [facility] INR POC 2.7 INR RANGE 2 - 3  Dietary changes: no    Health status changes: no    Bleeding/hemorrhagic complications: no    Recent/future hospitalizations: no    Any changes in medication regimen? yes       Details: d/c oxycodone, changed to Gabapentin.  Recent/future dental: no  Any missed doses?: no       Is patient compliant with meds? yes       Allergies: 1)  ! * Horse Serum  Anticoagulation Management History:      The patient is taking warfarin and comes in today for a routine follow up visit.  Positive risk factors for bleeding include an age of 75 years or older.  The bleeding index is 'intermediate risk'.  Positive CHADS2 values include History of HTN and Age > 73 years old.  The start date was 08/09/2007.  His last INR was 2.1.  Anticoagulation responsible provider: Tenny Craw MD, Gunnar Fusi.  INR POC: 2.7.  Cuvette Lot#: [id number].  Exp: 12/2010.    Anticoagulation Management Assessment/Plan:      The patient's current anticoagulation dose is Coumadin 5 mg tabs: Take as directed by coumadin clinic..  The target INR is 2 - 3.  The next INR is due 12/07/2009.  Anticoagulation instructions were given to patient.  Results were reviewed/authorized by Cloyde Reams, RN, BSN.  He was notified by Cloyde Reams RN.         Prior Anticoagulation Instructions: INR 2.1  Continue on same dosage 1 tablet daily except 1/2 tablet on Wednesdays.  Recheck in 4 weeks.    Current Anticoagulation Instructions: INR 2.7  Continue on same dosage 1 tablet daily except 1/2 tablet on Wednesdays.  Recheck in 4 weeks.

## 2010-04-30 NOTE — Op Note (Signed)
[facility]: [facility]   Imported By: Roderic Ovens 10/23/2009 11:57:21  _____________________________________________________________________  External Attachment:    Type:   Image     Comment:   External Document

## 2010-04-30 NOTE — Medication Information (Signed)
Summary: rov/eac  Anticoagulant Therapy  Managed by: Loma Newton, PharmD Referring MD: Shawnie Pons MD PCP: Ralene Ok, MD Supervising MD: Clifton Kazmir Oki MD, Cristal Deer Indication 1: Atrial Fibrillation (ICD-427.31) Lab Used: [facility] Site: [facility] INR POC 2.1 INR RANGE 2 - 3  Dietary changes: no    Health status changes: no    Bleeding/hemorrhagic complications: no    Recent/future hospitalizations: no    Any changes in medication regimen? yes    Recent/future dental: no  Any missed doses?: no       Is patient compliant with meds? yes       Current Medications (verified): 1)  Atenolol 50 Mg Tabs (Atenolol) .... Take 1 Tablet By Mouth Once A Day 2)  Percocet 5-325 Mg Tabs (Oxycodone-Acetaminophen) .Marland Kitchen.. 1 Tab Every 4 To 5 Hours 3)  Prilosec 20 Mg Cpdr (Omeprazole) .... Take 1 Tablet By Mouth Once A Day 4)  Pravastatin Sodium 10 Mg Tabs (Pravastatin Sodium) .... Take 1 Tablet By Mouth Once A Day 5)  Vitamin E 400 Unit Caps (Vitamin E) .... Take 1 Tablet By Mouth Once A Day 6)  Flax Seed Oil 1000 Mg Caps (Flaxseed (Linseed)) .... Take 2 Tablet By Mouth Once A Day 7)  Coumadin 5 Mg Tabs (Warfarin Sodium) .... Take As Directed By Coumadin Clinic. 8)  Vitamin D 1000 Unit Tabs (Cholecalciferol) .... One Tablet Once Daily 9)  Calcium 600 Mg Tabs (Calcium) .... Every Other Day 10)  B Complex  Tabs (B Complex Vitamins) .... Take 1 Tablet By Mouth Once A Day 11)  Vitamin C 500 Mg  Tabs (Ascorbic Acid) .... About 2 O 3 A Week 12)  Tribenzor Dose ? Marland Kitchen... Take 1 Tablet By Mouth Once A Day  Allergies (verified): 1)  ! * Horse Serum  Anticoagulation Management History:      The patient is taking warfarin and comes in today for a routine follow up visit.  Positive risk factors for bleeding include an age of 75 years or older.  The bleeding index is 'intermediate risk'.  Positive CHADS2 values include History of HTN and Age > 65 years old.  The start date was 08/09/2007.   Today's INR is 2.1.  Anticoagulation responsible provider: Clifton Jayln Madeira MD, Cristal Deer.  INR POC: 2.1.  Cuvette Lot#: [id number].  Exp: 10/2010.    Anticoagulation Management Assessment/Plan:      The patient's current anticoagulation dose is Coumadin 5 mg tabs: Take as directed by coumadin clinic..  The target INR is 2 - 3.  The next INR is due 10/01/2009.  Anticoagulation instructions were given to patient.  Results were reviewed/authorized by Loma Newton, PharmD.  He was notified by Loma Newton.         Prior Anticoagulation Instructions: INR 2.5  Continue same dose of 1 tablet every day except 1/2 tablet on Wednesday.    Current Anticoagulation Instructions: INR = 2.1  The patient is to continue with the same dose of coumadin.  This dosage includes: take 1 tablet all evenings except for wednesday take 1/2 tablet

## 2010-04-30 NOTE — Medication Information (Signed)
Summary: rov/sp  Anticoagulant Therapy  Managed by: Weston Brass, PharmD Referring MD: Shawnie Pons MD PCP: Ralene Ok, MD Supervising MD: Clifton James MD, Cristal Deer Indication 1: Atrial Fibrillation (ICD-427.31) Lab Used: [facility] Site: [facility] INR POC 1.2 INR RANGE 2 - 3   Health status changes: yes       Details: having injection today       Any missed doses?: yes     Details: Held Coumadin x 5 days for spinal injection     Allergies: 1)  ! * Horse Serum  Anticoagulation Management History:      The patient is taking warfarin and comes in today for a routine follow up visit.  Positive risk factors for bleeding include an age of 65 years or older.  The bleeding index is 'intermediate risk'.  Positive CHADS2 values include History of HTN and Age > 76 years old.  The start date was 08/09/2007.  Anticoagulation responsible provider: Clifton James MD, Cristal Deer.  INR POC: 1.2.  Cuvette Lot#: [id number].  Exp: 07/2009.    Anticoagulation Management Assessment/Plan:      The patient's current anticoagulation dose is Coumadin 5 mg tabs: Take as directed by coumadin clinic..  The target INR is 2 - 3.  The next INR is due 07/16/2009.  Anticoagulation instructions were given to patient.  Results were reviewed/authorized by Weston Brass, PharmD.  He was notified by Weston Brass PharmD.         Prior Anticoagulation Instructions: INR 2.1 Today take extra 2.5mg s and tomorrow take 7.5mg s then resume normal dose of 5mg  daily except 2.5mg s on Wednesdays.   Current Anticoagulation Instructions: INR 1.2  Restart Coumadin at normal dose of 1 tablet every day except 1/2 tablet on Wednesday when okay with MD

## 2010-04-30 NOTE — Medication Information (Signed)
Summary: rov/tm  Anticoagulant Therapy  Managed by: Weston Brass, PharmD Referring MD: Shawnie Pons MD PCP: Ralene Ok, MD Supervising MD: Clifton James MD, Cristal Deer Indication 1: Atrial Fibrillation (ICD-427.31) Lab Used: [facility] Site: [facility] INR POC 2.5 INR RANGE 2 - 3  Dietary changes: no    Health status changes: no    Bleeding/hemorrhagic complications: no    Recent/future hospitalizations: no    Any changes in medication regimen? no    Recent/future dental: no  Any missed doses?: no       Is patient compliant with meds? yes       Allergies: 1)  ! * Horse Serum  Anticoagulation Management History:      The patient is taking warfarin and comes in today for a routine follow up visit.  Positive risk factors for bleeding include an age of 75 years or older.  The bleeding index is 'intermediate risk'.  Positive CHADS2 values include History of HTN and Age > 75 years old.  The start date was 08/09/2007.  Anticoagulation responsible provider: Clifton James MD, Cristal Deer.  INR POC: 2.5.  Cuvette Lot#: [id number].  Exp: 10/2010.    Anticoagulation Management Assessment/Plan:      The patient's current anticoagulation dose is Coumadin 5 mg tabs: Take as directed by coumadin clinic..  The target INR is 2 - 3.  The next INR is due 09/03/2009.  Anticoagulation instructions were given to patient.  Results were reviewed/authorized by Weston Brass, PharmD.  He was notified by Weston Brass PharmD.         Prior Anticoagulation Instructions: INR 3.1 Skip tomorrow's dose then resume 5mg s daily except 2.5mg s on Wednesdays. Recheck in 2 weeks.   Current Anticoagulation Instructions: INR 2.5  Continue same dose of 1 tablet every day except 1/2 tablet on Wednesday.

## 2010-04-30 NOTE — Medication Information (Signed)
Summary: rov/tm  Anticoagulant Therapy  Managed by: Cloyde Reams, RN Referring MD: Shawnie Pons MD PCP: Ralene Ok, MD Supervising MD: Ladona Ridgel MD, Sharlot Gowda Indication 1: Atrial Fibrillation (ICD-427.31) Lab Used: [facility] Site: [facility] INR POC 2.5 INR RANGE 2 - 3  Dietary changes: no    Health status changes: no    Bleeding/hemorrhagic complications: no    Recent/future hospitalizations: no    Any changes in medication regimen? no    Recent/future dental: no  Any missed doses?: no       Is patient compliant with meds? yes       Allergies: 1)  ! * Horse Serum  Anticoagulation Management History:      The patient is taking warfarin and comes in today for a routine follow up visit.  Positive risk factors for bleeding include an age of 75 years or older.  The bleeding index is 'intermediate risk'.  Positive CHADS2 values include History of HTN and Age > 75 years old.  The start date was 08/09/2007.  Anticoagulation responsible provider: Ladona Ridgel MD, Sharlot Gowda.  INR POC: 2.5.  Cuvette Lot#: [id number].  Exp: 05/2009.    Anticoagulation Management Assessment/Plan:      The patient's current anticoagulation dose is Coumadin 5 mg tabs: Take as directed by coumadin clinic..  The target INR is 2 - 3.  The next INR is due 05/10/2009.  Anticoagulation instructions were given to patient.  Results were reviewed/authorized by Cloyde Reams, RN.  He was notified by Lew Dawes, PharmD Candidate.         Prior Anticoagulation Instructions: INR 2.4  CONTINUE TO TAKE 1 TAB EVERDAY EXCEPT TAKE 1/2 TAB ON WEDNESDAY.  RECHECK IN 4 WEEKS.  Current Anticoagulation Instructions: INR 2.5  Continue taking same dose of 1 tablet daily except 0.5 tablet on Wednesdays. Recheck in 4 weeks.

## 2010-04-30 NOTE — Assessment & Plan Note (Signed)
Summary: f72m   Visit Type:  6 months follow up Primary Provider:  Ralene Ok, MD  CC:  irregular heart beat.  History of Present Illness: Has lost his wife and also had an epidural.  Thighs seem to be getting weaker.   Breathing is ok.  Taking Oxycodone for back pain, and trying to get off.  He gets depressed if he gets off of it, and thinks he may be partially hooked on it.  Trying to get off of it.  Does not sleep well. Does not get more than four hours of sleep each evening.   Labs from Dr. Ludwig Clarks are ok per patient, but we do not have the numbers.    Current Medications (verified): 1)  Atenolol 50 Mg Tabs (Atenolol) .... Take 1 Tablet By Mouth Once A Day 2)  Percocet 5-325 Mg Tabs (Oxycodone-Acetaminophen) .Marland Kitchen.. 1 Tab Every 4 To 5 Hours 3)  Prilosec 20 Mg Cpdr (Omeprazole) .... Take 1 Tablet By Mouth Once A Day 4)  Pravastatin Sodium 10 Mg Tabs (Pravastatin Sodium) .... Take 1 Tablet By Mouth Once A Day 5)  Vitamin E 400 Unit Caps (Vitamin E) .... Take 1 Tablet By Mouth Once A Day 6)  Flax Seed Oil 1000 Mg Caps (Flaxseed (Linseed)) .... Take 2 Tablet By Mouth Once A Day 7)  Coumadin 5 Mg Tabs (Warfarin Sodium) .... Take As Directed By Coumadin Clinic. 8)  Vitamin D 1000 Unit Tabs (Cholecalciferol) .... One Tablet Once Daily 9)  Calcium 600 Mg Tabs (Calcium) .... Every Other Day 10)  B Complex  Tabs (B Complex Vitamins) .... Take 1 Tablet By Mouth Once A Day 11)  Vitamin C 500 Mg  Tabs (Ascorbic Acid) .... About 2 O 3 A Week 12)  Tribenzor Dose ? Marland Kitchen... Take 1 Tablet By Mouth Once A Day  Allergies: 1)  ! * Horse Serum  Vital Signs:  Patient profile:   77 year old male Height:      72 inches Weight:      171.50 pounds BMI:     23.34 Pulse rate:   75 / minute Pulse rhythm:   irregular Resp:     18 per minute BP sitting:   120 / 80  (left arm) Cuff size:   large  Vitals Entered By: Vikki Ports (Jul 31, 2009 4:16 PM) CC: irregular heart beat Comments INR today  3.1   Physical Exam  General:  Well developed, well nourished, in no acute distress. Head:  normocephalic and atraumatic Eyes:  PERRLA/EOM intact; conjunctiva and lids normal. Lungs:  Clear bilaterally to auscultation and percussion. Heart:  irregularly, irregular rhythm..  No obvious murmur noted.     Abdomen:  Bowel sounds positive; abdomen soft and non-tender without masses, organomegaly, or hernias noted. No hepatosplenomegaly. Pulses:  pulses normal in all 4 extremities Extremities:  No edema.  Neurologic:  Alert and oriented x 3.   EKG  Procedure date:  07/31/2009  Findings:      Atrial fibrillation.  RBBB.   Impression & Recommendations:  Problem # 1:  ATRIAL FIBRILLATION, HX OF (ICD-V12.59)  rate is controlled at present time.  He remains on rate control, with appropriate thromboprophylaxis. His updated medication list for this problem includes:    Atenolol 50 Mg Tabs (Atenolol) .Marland Kitchen... Take 1 tablet by mouth once a day    Coumadin 5 Mg Tabs (Warfarin sodium) .Marland Kitchen... Take as directed by coumadin clinic.  Orders: EKG w/ Interpretation (93000)  Problem #  2:  HYPERTENSION (ICD-401.9)  controlled at present time.   His updated medication list for this problem includes:    Atenolol 50 Mg Tabs (Atenolol) .Marland Kitchen... Take 1 tablet by mouth once a day  Orders: EKG w/ Interpretation (93000)  Problem # 3:  DYSLIPIDEMIA (ICD-272.4) last LDL 88 and HDL 41 in fall of 2010. His updated medication list for this problem includes:    Pravastatin Sodium 10 Mg Tabs (Pravastatin sodium) .Marland Kitchen... Take 1 tablet by mouth once a day  Problem # 4:  MITRAL REGURGITATION (ICD-396.3) mod by last echo 2009.  Recheck in 6 months.   Orders: EKG w/ Interpretation (93000)  Patient Instructions: 1)  Your physician recommends that you schedule a follow-up appointment in: 4 MONTHS 2)  Your physician recommends that you continue on your current medications as directed. Please refer to the Current  Medication list given to you today.  Appended Document: f45m This pt had an echocardiogram performed prior to his appt with Dr Riley Kill on 07/31/09.

## 2010-04-30 NOTE — Medication Information (Signed)
Summary: Ian Sullivan  Anticoagulant Therapy  Managed by: Eda Keys, PharmD Referring MD: Shawnie Pons MD PCP: Ralene Ok, MD Supervising MD: Tenny Craw MD, Gunnar Fusi Indication 1: Atrial Fibrillation (ICD-427.31) Lab Used: [facility] Site: [facility] INR POC 2.3 INR RANGE 2 - 3  Dietary changes: no    Health status changes: no    Bleeding/hemorrhagic complications: no    Recent/future hospitalizations: no    Any changes in medication regimen? no    Recent/future dental: no  Any missed doses?: yes     Details: Missed one day in mid September  Is patient compliant with meds? yes       Allergies: 1)  ! * Horse Serum  Anticoagulation Management History:      The patient is taking warfarin and comes in today for a routine follow up visit.  Positive risk factors for bleeding include an age of 75 years or older.  The bleeding index is 'intermediate risk'.  Positive CHADS2 values include History of CHF, History of HTN, and Age > 96 years old.  The start date was 08/09/2007.  His last INR was 2.1.  Anticoagulation responsible provider: Tenny Craw MD, Gunnar Fusi.  INR POC: 2.3.  Cuvette Lot#: [id number].  Exp: 01/2011.    Anticoagulation Management Assessment/Plan:      The patient's current anticoagulation dose is Coumadin 5 mg tabs: Take as directed by coumadin clinic..  The target INR is 2 - 3.  The next INR is due 02/04/2010.  Anticoagulation instructions were given to patient.  Results were reviewed/authorized by Eda Keys, PharmD.  He was notified by Haynes Hoehn, PharmD Candidate.         Prior Anticoagulation Instructions: INR 3.3  Skip your dose of Coumadin tomorrow (12/08/09). Then resume taking 1 tablet every day except 1/2 tablet on Wednesdays. Re-check INR in 4 weeks.   Current Anticoagulation Instructions: INR 2.3  Continue Coumadin 1 tablet every day of the week, except 1/2 tablet on Wednesday.  Return to clinic in 4 weeks.

## 2010-04-30 NOTE — Progress Notes (Signed)
----   Converted from flag ---- ---- 07/02/2009 12:59 PM, Julieta Gutting, RN, BSN wrote: Just wanted to make sure you know this is okay with Dr Riley Kill.  ---- 06/29/2009 11:36 AM, Bethena Midget, RN, BSN wrote: Pt. pending epidural injection on 07/09/09 with Dr. Alvester Morin, he needs to be off for 5 days prior. Is he cleared to hold? ------------------------------

## 2010-04-30 NOTE — Progress Notes (Signed)
Summary: stop Coumadin  Phone Note From Other Clinic   Caller: NUrse Megan Summary of Call: Per [facility] Dr [facility].Pt needs to stop coumadin 5 days for an epidural injection. Is pt ok for this ofc [phone] fax [phone] Initial call taken by: Edman Circle,  January 28, 2010 8:20 AM  Follow-up for Phone Call        [place] at [facility] knows that Dr Riley Kill is not in the office today and will be here on Wednesday when the pt has an appt.  OK per Neysa Bonito for Dr Riley Kill to answer at the time of the appt. Follow-up by: Charolotte Capuchin, RN,  January 28, 2010 10:54 AM

## 2010-04-30 NOTE — Medication Information (Signed)
Summary: rov/ewj  Anticoagulant Therapy  Managed by: Eda Keys, PharmD Referring MD: Shawnie Pons MD PCP: Ralene Ok, MD Supervising MD: Tenny Craw MD, Gunnar Fusi Indication 1: Atrial Fibrillation (ICD-427.31) Lab Used: [facility] Site: [facility] INR POC 3.3 INR RANGE 2 - 3  Dietary changes: yes       Details: Hasn't had a big appetite recently, so he hasn't eaten much  Health status changes: no    Bleeding/hemorrhagic complications: no    Recent/future hospitalizations: no    Any changes in medication regimen? yes       Details: D/c percocet, initiated tramadol and gabapentin  Recent/future dental: no  Any missed doses?: no       Is patient compliant with meds? yes       Allergies: 1)  ! * Horse Serum  Anticoagulation Management History:      The patient is taking warfarin and comes in today for a routine follow up visit.  Positive risk factors for bleeding include an age of 32 years or older.  The bleeding index is 'intermediate risk'.  Positive CHADS2 values include History of HTN and Age > 25 years old.  The start date was 08/09/2007.  His last INR was 2.1.  Anticoagulation responsible provider: Tenny Craw MD, Gunnar Fusi.  INR POC: 3.3.  Cuvette Lot#: [id number].  Exp: 01/2011.    Anticoagulation Management Assessment/Plan:      The patient's current anticoagulation dose is Coumadin 5 mg tabs: Take as directed by coumadin clinic..  The target INR is 2 - 3.  The next INR is due 01/04/2010.  Anticoagulation instructions were given to patient.  Results were reviewed/authorized by Eda Keys, PharmD.  He was notified by Harrel Carina, PharmD candidate.         Prior Anticoagulation Instructions: INR 2.7  Continue on same dosage 1 tablet daily except 1/2 tablet on Wednesdays.  Recheck in 4 weeks.    Current Anticoagulation Instructions: INR 3.3  Skip your dose of Coumadin tomorrow (12/08/09). Then resume taking 1 tablet every day except 1/2 tablet on Wednesdays. Re-check INR  in 4 weeks.

## 2010-04-30 NOTE — Medication Information (Signed)
Summary: rov/eac  Anticoagulant Therapy  Managed by: Bethena Midget, RN, BSN Referring MD: Shawnie Pons MD PCP: Ralene Ok, MD Supervising MD: Tenny Craw MD, Gunnar Fusi Indication 1: Atrial Fibrillation (ICD-427.31) Lab Used: [facility] Site: [facility] INR POC 2.1 INR RANGE 2 - 3  Dietary changes: no    Health status changes: yes       Details: Lower back pain  Bleeding/hemorrhagic complications: no     Any changes in medication regimen? no    Recent/future dental: no  Any missed doses?: yes     Details: missed 06/26/09  Is patient compliant with meds? yes      Comments: Pending Epidural injection on 07/09/09 needs be off for 5 days. Flag sent to Dr. Riley Kill and Mickie Kay, RN  Allergies: 1)  ! * Horse Serum  Anticoagulation Management History:      The patient is taking warfarin and comes in today for a routine follow up visit.  Positive risk factors for bleeding include an age of 75 years or older.  The bleeding index is 'intermediate risk'.  Positive CHADS2 values include History of HTN and Age > 52 years old.  The start date was 08/09/2007.  Anticoagulation responsible provider: Tenny Craw MD, Gunnar Fusi.  INR POC: 2.1.  Cuvette Lot#: [id number].  Exp: 07/2009.    Anticoagulation Management Assessment/Plan:      The patient's current anticoagulation dose is Coumadin 5 mg tabs: Take as directed by coumadin clinic..  The target INR is 2 - 3.  The next INR is due 07/16/2009.  Anticoagulation instructions were given to patient.  Results were reviewed/authorized by Bethena Midget, RN, BSN.  He was notified by Bethena Midget, RN, BSN.         Prior Anticoagulation Instructions: INR 3.6  Do NOT take coumadin tomorrow (wednesday).  Then return to normal dosing schedule of 1/2 tablet on Wednesday and 1 tablet all other days.  Return to clinic in 3 weeks.   Current Anticoagulation Instructions: INR 2.1 Today take extra 2.5mg s and tomorrow take 7.5mg s then resume normal dose of 5mg  daily except 2.5mg s on  Wednesdays.

## 2010-04-30 NOTE — Medication Information (Signed)
Summary: Ian Sullivan  Anticoagulant Therapy  Managed by: Aubery Lapping.D. Referring MD: Shawnie Pons MD PCP: Ralene Ok, MD Supervising MD: Excell Seltzer MD, Casimiro Needle Indication 1: Atrial Fibrillation (ICD-427.31) Lab Used: [facility] Site: [facility] INR POC 2.3 INR RANGE 2 - 3  Dietary changes: no    Health status changes: no    Bleeding/hemorrhagic complications: no    Recent/future hospitalizations: no    Any changes in medication regimen? no    Recent/future dental: no  Any missed doses?: no       Is patient compliant with meds? yes       Allergies: 1)  ! * Horse Serum  Anticoagulation Management History:      The patient is taking warfarin and comes in today for a routine follow up visit.  Positive risk factors for bleeding include an age of 75 years or older.  The bleeding index is 'intermediate risk'.  Positive CHADS2 values include History of CHF, History of HTN, and Age > 56 years old.  The start date was 08/09/2007.  His last INR was 2.1.  Anticoagulation responsible Domino Holten: Excell Seltzer MD, Casimiro Needle.  INR POC: 2.3.  Cuvette Lot#: [id number].  Exp: 01/2011.    Anticoagulation Management Assessment/Plan:      The patient's current anticoagulation dose is Coumadin 5 mg tabs: Take as directed by coumadin clinic..  The target INR is 2 - 3.  The next INR is due 03/18/2010.  Anticoagulation instructions were given to patient.  Results were reviewed/authorized by Hazard, Morrie Sheldon.D..         Prior Anticoagulation Instructions: Take 2 tablets this evening. Then continue on regular regimen of 1 tablet daily except for 1/2 tablet on Thursday.   Current Anticoagulation Instructions: INR 2.3  No change in current dose. Continue taking 1 tablet (5mg ) daily except 1/2 tablet (2.5mg ) on Wednesday.  Next INR in 4 weeks.

## 2010-04-30 NOTE — Assessment & Plan Note (Signed)
Summary: f28m   Visit Type:  4 mo f/u Primary Provider:  Ralene Ok, MD   History of Present Illness: Still has alot of swelling in the feet.  He is losing muscle in his lower legs.  He has pain in R back.  Now has been getting shots over past few months.  These are steroid injections.  Was put on gabapentin for nerve pain.  Now off oxycodone.    Current Medications (verified): 1)  Atenolol 50 Mg Tabs (Atenolol) .... Take 1 Tablet By Mouth Once A Day 2)  Prilosec 20 Mg Cpdr (Omeprazole) .... Take 1 Tablet By Mouth Once A Day 3)  Pravastatin Sodium 10 Mg Tabs (Pravastatin Sodium) .... Take 1 Tablet By Mouth Once A Day 4)  Vitamin E 400 Unit Caps (Vitamin E) .... Take 1 Tablet By Mouth Once A Day 5)  Flax Seed Oil 1000 Mg Caps (Flaxseed (Linseed)) .... Take 2 Tablet By Mouth Once A Day 6)  Coumadin 5 Mg Tabs (Warfarin Sodium) .... Take As Directed By Coumadin Clinic. 7)  Vitamin D 1000 Unit Tabs (Cholecalciferol) .... One Tablet Once Daily 8)  Calcium 600 Mg Tabs (Calcium) .... Every Other Day 9)  B Complex  Tabs (B Complex Vitamins) .... Take 1 Tablet By Mouth Once A Day 10)  Vitamin C 500 Mg  Tabs (Ascorbic Acid) .... About 2 O 3 A Week 11)  Tribenzor 40-5-12.5 Mg Tabs (Olmesartan-Amlodipine-Hctz) .Marland Kitchen.. 1 Tab Once Daily 12)  Gabapentin 300 Mg  Caps (Gabapentin) .... 2 Capsules Qam, 1 Capsule At Lunch, and 2 Capsules At Bedtime 13)  Tramadol Hcl 50 Mg  Tabs (Tramadol Hcl) .... As Needed 14)  Fish Oil 1000 Mg Caps (Omega-3 Fatty Acids) .Marland Kitchen.. 1 Cap Two Times A Day  Allergies: 1)  ! * Horse Serum  Past History:  Past Medical History: Last updated: 07/11/2008 ATRIAL FIBRILLATION, HX OF (ICD-V12.59) HYPERTENSION (ICD-401.9) ANXIETY (ICD-300.00) DEPRESSION (ICD-311) DYSLIPIDEMIA (ICD-272.4) ESOPHAGEAL MOTILITY DISORDER (ICD-530.5) COUMADIN THERAPY (ICD-V58.61) VENTRAL HERNIA WITH TRANSIENT INCARCERATION (ICD-552.20) DUODENITIS (ICD-535.60) GASTRITIS (ICD-535.50) HEMORRHOIDS  (ICD-455.6) DIVERTICULOSIS, COLON (ICD-562.10) HIATAL HERNIA (ICD-553.3) BARRETTS ESOPHAGUS (ICD-530.85) IRRITABLE BOWEL SYNDROME (ICD-564.1) SLEEP APNEA (ICD-780.57)  Past Surgical History: Last updated: 07/11/2008 Nissen Fundoplication (1982) TURP (4098'J) Bilateral carpal tunnel (2003) Appendectomy (1914) Tonsillectomy Vasectomy Lumbar Laminectomy (1985) cervical disc 1977 Small bowel obstruction (x4) Cardiac Cath (1991) Right wrist release Central decompressive laminectomy L2-L3, L3-L4, and L4-L5,   right L4-L5 transforaminal lumbar interbody fusion with 12-mm DePuy   Concorde lordotic cage and local bone graft.  L4-L5 internal fixation   using a pedicle screws and rods, DePuy Monarch System.  Posterolateral   fusion at L4-L5 with local bone graft.  (11/08)  Family History: Last updated: 07/11/2008 Family History of Heart Disease: Father died at age 21 MI Mother died at age 55 Stroke  Social History: Last updated: 07/11/2008  Patient is a former smoker. -stopped 81 Alcohol Use - yes-occasional Illicit Drug Use - no Patient gets regular exercise.  Vital Signs:  Patient profile:   77 year old male Height:      72 inches Weight:      186.12 pounds BMI:     25.33 Pulse rate:   84 / minute Pulse rhythm:   irregular BP sitting:   100 / 76  (left arm) Cuff size:   large  Vitals Entered By: Danielle Rankin, CMA (December 10, 2009 2:40 PM)  Physical Exam  General:  Well developed, well nourished, in no acute distress. Head:  normocephalic and atraumatic Eyes:  PERRLA/EOM intact; conjunctiva and lids normal. Lungs:  Clear to auscultation and percussion Heart:  irreuglarly irregular rhythm.  No def murmur Extremities:  two plus bilateral edema. Neurologic:  Alert and oriented x 3.   EKG  Procedure date:  12/10/2009  Findings:      Atrial fibrillation with RBBB.  Nonspecific ST and T abnormality  Impression & Recommendations:  Problem # 1:  EDEMA  (ICD-782.3)  May be drug related.  Also recent steroids.  Neck veins flat at present.  Does have atrial fib.  Will replace Tribenzor with atacand HCTZ in same dose regimen, eliminately amlodipine in regimen.  As such, will see if edema resolves.  Will need BMET in one week.  Reviewed with patient in detail.   Orders: EKG w/ Interpretation (93000)  Problem # 2:  ATRIAL FIBRILLATION, HX OF (ICD-V12.59)  rate is controlled at present.  His updated medication list for this problem includes:    Atenolol 50 Mg Tabs (Atenolol) .Marland Kitchen... Take 1 tablet by mouth once a day    Coumadin 5 Mg Tabs (Warfarin sodium) .Marland Kitchen... Take as directed by coumadin clinic.    His updated medication list for this problem includes:    Atenolol 50 Mg Tabs (Atenolol) .Marland Kitchen... Take 1 tablet by mouth once a day    Coumadin 5 Mg Tabs (Warfarin sodium) .Marland Kitchen... Take as directed by coumadin clinic.  His updated medication list for this problem includes:    Atenolol 50 Mg Tabs (Atenolol) .Marland Kitchen... Take 1 tablet by mouth once a day    Coumadin 5 Mg Tabs (Warfarin sodium) .Marland Kitchen... Take as directed by coumadin clinic.  Orders: EKG w/ Interpretation (93000)  Problem # 3:  MITRAL REGURGITATION (ICD-396.3) monitor  Patient Instructions: 1)  Your physician recommends that you schedule a follow-up appointment in: 4 weeks with Dr. Riley Kill 2)  Your physician recommends that you return for lab work in 1 week:  bmet 782.3 3)  Your physician has requested that you regularly monitor and record your blood pressure readings at home.  Please use the same machine at the same time of day to check your readings and record them to bring to your follow-up visit. 4)  Your physician has recommended you make the following change in your medication: Call the office if you are tolerating the Atacand so we can call in your prescription.   Prescriptions: ATACAND HCT 16-12.5 MG TABS (CANDESARTAN CILEXETIL-HCTZ) Take 1 tablet daily  #30 x 6   Entered by:   Lisabeth Devoid RN   Authorized by:   Ronaldo Miyamoto, MD, [facility]   Signed by:   Lisabeth Devoid RN on 12/10/2009   Method used:   Historical   RxID:   [id number]

## 2010-04-30 NOTE — Medication Information (Signed)
Summary: rov/tm  Anticoagulant Therapy  Managed by: Eda Keys, PharmD Referring MD: Shawnie Pons MD PCP: Ralene Ok, MD Supervising MD: Myrtis Ser MD, Tinnie Gens Indication 1: Atrial Fibrillation (ICD-427.31) Lab Used: [facility] Site: [facility] INR POC 3.6 INR RANGE 2 - 3  Dietary changes: no    Health status changes: no    Bleeding/hemorrhagic complications: no    Recent/future hospitalizations: no    Any changes in medication regimen? no    Recent/future dental: yes     Details: pt to have cataract surgery at end of march  Any missed doses?: yes     Details: missed one dose on 2/26  Is patient compliant with meds? yes       Allergies: 1)  ! * Horse Serum  Anticoagulation Management History:      The patient is taking warfarin and comes in today for a routine follow up visit.  Positive risk factors for bleeding include an age of 75 years or older.  The bleeding index is 'intermediate risk'.  Positive CHADS2 values include History of HTN and Age > 54 years old.  The start date was 08/09/2007.  Anticoagulation responsible provider: Myrtis Ser MD, Tinnie Gens.  INR POC: 3.6.  Cuvette Lot#: [id number].  Exp: 07/2009.    Anticoagulation Management Assessment/Plan:      The patient's current anticoagulation dose is Coumadin 5 mg tabs: Take as directed by coumadin clinic..  The target INR is 2 - 3.  The next INR is due 07/03/2009.  Anticoagulation instructions were given to patient.  Results were reviewed/authorized by Eda Keys, PharmD.  He was notified by Eda Keys.         Prior Anticoagulation Instructions: INR 2.7 Continue 5mg s daily except 2.5mg s on Wednesdays. Recheck in 4 weeks.   Current Anticoagulation Instructions: INR 3.6  Do NOT take coumadin tomorrow (wednesday).  Then return to normal dosing schedule of 1/2 tablet on Wednesday and 1 tablet all other days.  Return to clinic in 3 weeks.

## 2010-04-30 NOTE — Medication Information (Signed)
Summary: rov/tm  Anticoagulant Therapy  Managed by: Bethena Midget, RN, BSN Referring MD: Shawnie Pons MD PCP: Ralene Ok, MD Supervising MD: Juanda Chance MD, Tarisa Paola Indication 1: Atrial Fibrillation (ICD-427.31) Lab Used: [facility] Site: [facility] INR POC 1.6 INR RANGE 2 - 3  Dietary changes: no    Health status changes: no    Bleeding/hemorrhagic complications: no    Recent/future hospitalizations: no    Any changes in medication regimen? no    Recent/future dental: no  Any missed doses?: yes     Details: missed Saturday's dose   Is patient compliant with meds? yes      Comments: Had Epidural last Monday, restarted that day.   Allergies: 1)  ! * Horse Serum  Anticoagulation Management History:      The patient is taking warfarin and comes in today for a routine follow up visit.  Positive risk factors for bleeding include an age of 75 years or older.  The bleeding index is 'intermediate risk'.  Positive CHADS2 values include History of HTN and Age > 75 years old.  The start date was 08/09/2007.  Anticoagulation responsible provider: Juanda Chance MD, Smitty Cords.  INR POC: 1.6.  Cuvette Lot#: [id number].  Exp: 07/2009.    Anticoagulation Management Assessment/Plan:      The patient's current anticoagulation dose is Coumadin 5 mg tabs: Take as directed by coumadin clinic..  The target INR is 2 - 3.  The next INR is due 07/31/2009.  Anticoagulation instructions were given to patient.  Results were reviewed/authorized by Bethena Midget, RN, BSN.  He was notified by Bethena Midget, RN, BSN.         Prior Anticoagulation Instructions: INR 1.2  Restart Coumadin at normal dose of 1 tablet every day except 1/2 tablet on Wednesday when okay with MD  Current Anticoagulation Instructions: INR 1.6 Today take extra 2.5mg s on Tuesday take 7.5mg s  then resume 5mg s daily except 2.5mg s on Wednesdays. Recheck in 2 weeks.

## 2010-04-30 NOTE — Letter (Signed)
Summary: [facility] Medical Clearance   [facility] Medical Clearance   Imported By: Roderic Ovens 02/13/2010 13:53:34  _____________________________________________________________________  External Attachment:    Type:   Image     Comment:   External Document

## 2010-04-30 NOTE — Medication Information (Signed)
Summary: rov/mb  Anticoagulant Therapy  Managed by: Cloyde Reams, RN, BSN Referring MD: Shawnie Pons MD PCP: Ralene Ok, MD Supervising MD: Graciela Husbands MD, Viviann Spare Indication 1: Atrial Fibrillation (ICD-427.31) Lab Used: [facility] Site: [facility] INR POC 2.1 INR RANGE 2 - 3  Dietary changes: no    Health status changes: no    Bleeding/hemorrhagic complications: no    Recent/future hospitalizations: no    Any changes in medication regimen? no    Recent/future dental: no  Any missed doses?: yes     Details: missed 1 dosage on 09/19/09  Is patient compliant with meds? yes       Allergies: 1)  ! * Horse Serum  Anticoagulation Management History:      The patient is taking warfarin and comes in today for a routine follow up visit.  Positive risk factors for bleeding include an age of 75 years or older.  The bleeding index is 'intermediate risk'.  Positive CHADS2 values include History of HTN and Age > 63 years old.  The start date was 08/09/2007.  His last INR was 2.1.  Anticoagulation responsible provider: Graciela Husbands MD, Viviann Spare.  INR POC: 2.1.  Cuvette Lot#: [id number].  Exp: 11/2010.    Anticoagulation Management Assessment/Plan:      The patient's current anticoagulation dose is Coumadin 5 mg tabs: Take as directed by coumadin clinic..  The target INR is 2 - 3.  The next INR is due 10/27/2008.  Anticoagulation instructions were given to patient.  Results were reviewed/authorized by Cloyde Reams, RN, BSN.  He was notified by Cloyde Reams RN.         Prior Anticoagulation Instructions: INR = 2.1  The patient is to continue with the same dose of coumadin.  This dosage includes: take 1 tablet all evenings except for wednesday take 1/2 tablet  Current Anticoagulation Instructions: INR 2.1  Continue on same dosage 1 tablet daily except 1/2 tablet on Wednesdays.  Recheck in 4 weeks.

## 2010-04-30 NOTE — Medication Information (Signed)
Summary: rov/tm  Anticoagulant Therapy  Managed by: Bethena Midget, RN, BSN Referring MD: Shawnie Pons MD PCP: Ralene Ok, MD Supervising MD: Daleen Squibb MD, Maisie Fus Indication 1: Atrial Fibrillation (ICD-427.31) Lab Used: [facility] Site: [facility] INR POC 3.1 INR RANGE 2 - 3  Dietary changes: no    Health status changes: no    Bleeding/hemorrhagic complications: no    Recent/future hospitalizations: no    Any changes in medication regimen? no    Recent/future dental: no  Any missed doses?: no       Is patient compliant with meds? yes      Comments: Seeing Dr Riley Kill today.   Allergies: 1)  ! * Horse Serum  Anticoagulation Management History:      The patient is taking warfarin and comes in today for a routine follow up visit.  Positive risk factors for bleeding include an age of 75 years or older.  The bleeding index is 'intermediate risk'.  Positive CHADS2 values include History of HTN and Age > 33 years old.  The start date was 08/09/2007.  Anticoagulation responsible provider: Daleen Squibb MD, Maisie Fus.  INR POC: 3.1.  Cuvette Lot#: [id number].  Exp: 08/2010.    Anticoagulation Management Assessment/Plan:      The patient's current anticoagulation dose is Coumadin 5 mg tabs: Take as directed by coumadin clinic..  The target INR is 2 - 3.  The next INR is due 08/14/2009.  Anticoagulation instructions were given to patient.  Results were reviewed/authorized by Bethena Midget, RN, BSN.  He was notified by Bethena Midget, RN, BSN.         Prior Anticoagulation Instructions: INR 1.6 Today take extra 2.5mg s on Tuesday take 7.5mg s  then resume 5mg s daily except 2.5mg s on Wednesdays. Recheck in 2 weeks.   Current Anticoagulation Instructions: INR 3.1 Skip tomorrow's dose then resume 5mg s daily except 2.5mg s on Wednesdays. Recheck in 2 weeks.

## 2010-05-02 NOTE — Assessment & Plan Note (Signed)
Summary: 2 month rov.sl   Visit Type:  Follow-up Primary Provider:  Ralene Ok, MD  CC:  no complaints.  History of Present Illness: Overall doing ok.  Had an episode of chest pain while carrying something heavy, lasted less than a minute, non since.  May need addtional srugery at L2-L3 by Dr. Otelia Sergeant.  No exertional symptoms.  Echo report reviewed.    Current Medications (verified): 1)  Carvedilol 6.25 Mg Tabs (Carvedilol) .... Take One and One-Half Tablet By Mouth Twice A Day 2)  Prilosec 20 Mg Cpdr (Omeprazole) .... Take 1 Tablet By Mouth Once A Day 3)  Pravastatin Sodium 10 Mg Tabs (Pravastatin Sodium) .... Take 1 Tablet By Mouth Once A Day 4)  Vitamin E 400 Unit Caps (Vitamin E) .... Take 1 Tablet By Mouth Once A Day 5)  Coumadin 5 Mg Tabs (Warfarin Sodium) .... Take As Directed By Coumadin Clinic. 6)  Vitamin D 1000 Unit Tabs (Cholecalciferol) .... One Tablet Once Daily 7)  Calcium 600 Mg Tabs (Calcium) .... Every Other Day 8)  B Complex  Tabs (B Complex Vitamins) .... Take 1 Tablet By Mouth Once A Day 9)  Vitamin C 500 Mg  Tabs (Ascorbic Acid) .... About 2 O 3 A Week 10)  Atacand Hct 16-12.5 Mg Tabs (Candesartan Cilexetil-Hctz) .... Take 1 Tablet Daily 11)  Gabapentin 300 Mg  Caps (Gabapentin) .... 2 Tablets Am- 1 Tablet At Lunch and 2 Tablets Pm 12)  Tramadol Hcl 50 Mg  Tabs (Tramadol Hcl) .... As Needed 13)  Omega 3,6,9  1.000mg  .... 1 Cap Daily 14)  Potassium Chloride Cr 10 Meq Cr-Caps (Potassium Chloride) .... Take One Tablet By Mouth Daily 15)  Vitamin B-12 250 Mcg Tabs (Cyanocobalamin) .... Take 1 Tablet By Mouth Once A Day  Allergies (verified): 1)  ! * Horse Serum  Past History:  Past Medical History: Last updated: 07/11/2008 ATRIAL FIBRILLATION, HX OF (ICD-V12.59) HYPERTENSION (ICD-401.9) ANXIETY (ICD-300.00) DEPRESSION (ICD-311) DYSLIPIDEMIA (ICD-272.4) ESOPHAGEAL MOTILITY DISORDER (ICD-530.5) COUMADIN THERAPY (ICD-V58.61) VENTRAL HERNIA WITH TRANSIENT  INCARCERATION (ICD-552.20) DUODENITIS (ICD-535.60) GASTRITIS (ICD-535.50) HEMORRHOIDS (ICD-455.6) DIVERTICULOSIS, COLON (ICD-562.10) HIATAL HERNIA (ICD-553.3) BARRETTS ESOPHAGUS (ICD-530.85) IRRITABLE BOWEL SYNDROME (ICD-564.1) SLEEP APNEA (ICD-780.57)  Vital Signs:  Patient profile:   77 year old male Height:      72 inches Weight:      180 pounds Pulse rate:   72 / minute Pulse rhythm:   regular BP sitting:   124 / 78  (right arm)  Vitals Entered By: Jacquelin Hawking, CMA (April 05, 2010 12:02 PM)  Physical Exam  General:  Well developed, well nourished, in no acute distress. Head:  normocephalic and atraumatic Eyes:  PERRLA/EOM intact; conjunctiva and lids normal. Lungs:  Clear bilaterally to auscultation and percussion. Heart:  minimal systolic murmur.  Irregularly irregular.  Extremities:  ankle swelling is gone.   Neurologic:  Alert and oriented x 3.   Echocardiogram  Procedure date:  07/31/2009  Findings:      Study Conclusions            - Left ventricle: The cavity size was normal. Wall thickness was       increased in a pattern of mild LVH. Systolic function was normal.       The estimated ejection fraction was in the range of 55% to 60%.     - Aortic valve: Trivial regurgitation.     - Mitral valve: Mild regurgitation.     - Left atrium: The atrium was mildly dilated.     -  Right atrium: The atrium was mildly dilated.     - Atrial septum: No defect or patent foramen ovale was identified.  EKG  Procedure date:  04/05/2010  Findings:      atrial fibrillation with controlled ventricular response, RBBB.    Impression & Recommendations:  Problem # 1:  ATRIAL FIBRILLATION (ICD-427.31) rate is well controlled.  Continue warfarin. His updated medication list for this problem includes:    Carvedilol 6.25 Mg Tabs (Carvedilol) .Marland Kitchen... Take one and one-half tablet by mouth twice a day    Coumadin 5 Mg Tabs (Warfarin sodium) .Marland Kitchen... Take as directed by coumadin  clinic.  Problem # 2:  MITRAL REGURGITATION (ICD-396.3) see recent echo.  Euvolemic  Problem # 3:  HYPERTENSION (ICD-401.9) stable.  recent labs ok. His updated medication list for this problem includes:    Carvedilol 6.25 Mg Tabs (Carvedilol) .Marland Kitchen... Take one and one-half tablet by mouth twice a day    Atacand Hct 16-12.5 Mg Tabs (Candesartan cilexetil-hctz) .Marland Kitchen... Take 1 tablet daily

## 2010-05-02 NOTE — Medication Information (Signed)
Summary: rov/kh  Anticoagulant Therapy  Managed by: Weston Brass, PharmD Referring MD: Shawnie Pons MD PCP: Ralene Ok, MD Supervising MD: Tenny Craw MD, Gunnar Fusi Indication 1: Atrial Fibrillation (ICD-427.31) Lab Used: [facility] Site: [facility] INR POC 2.4 INR RANGE 2 - 3  Dietary changes: no    Health status changes: no    Bleeding/hemorrhagic complications: no    Recent/future hospitalizations: no    Any changes in medication regimen? no    Recent/future dental: no  Any missed doses?: no       Is patient compliant with meds? yes       Allergies: 1)  ! * Horse Serum  Anticoagulation Management History:      The patient is taking warfarin and comes in today for a routine follow up visit.  Positive risk factors for bleeding include an age of 75 years or older.  The bleeding index is 'intermediate risk'.  Positive CHADS2 values include History of CHF, History of HTN, and Age > 75 years old.  The start date was 08/09/2007.  His last INR was 2.1.  Anticoagulation responsible provider: Tenny Craw MD, Gunnar Fusi.  INR POC: 2.4.  Cuvette Lot#: [id number].  Exp: 05/2011.    Anticoagulation Management Assessment/Plan:      The patient's current anticoagulation dose is Coumadin 5 mg tabs: Take as directed by coumadin clinic..  The target INR is 2 - 3.  The next INR is due 04/15/2010.  Anticoagulation instructions were given to patient.  Results were reviewed/authorized by Weston Brass, PharmD.  He was notified by Weston Brass PharmD.         Prior Anticoagulation Instructions: INR 2.3  No change in current dose. Continue taking 1 tablet (5mg ) daily except 1/2 tablet (2.5mg ) on Wednesday.  Next INR in 4 weeks.  Current Anticoagulation Instructions: INR 2.4  Continue same dose of 1 tablet every day except 1/2 tablet on Wednesday.  Recheck INR in 4 weeks.

## 2010-05-02 NOTE — Medication Information (Signed)
Summary: rov/sp  Anticoagulant Therapy  Managed by: Cloyde Reams, RN, BSN Referring MD: Shawnie Pons MD PCP: Ralene Ok, MD Supervising MD: Tenny Craw MD, Gunnar Fusi Indication 1: Atrial Fibrillation (ICD-427.31) Lab Used: [facility] Site: [facility] INR POC 1.4 INR RANGE 2 - 3  Dietary changes: no    Health status changes: no    Bleeding/hemorrhagic complications: no    Recent/future hospitalizations: no    Any changes in medication regimen? no    Recent/future dental: no  Any missed doses?: no       Is patient compliant with meds? yes       Allergies: 1)  ! * Horse Serum  Anticoagulation Management History:      The patient is taking warfarin and comes in today for a routine follow up visit.  Positive risk factors for bleeding include an age of 75 years or older.  The bleeding index is 'intermediate risk'.  Positive CHADS2 values include History of CHF, History of HTN, and Age > 21 years old.  The start date was 08/09/2007.  His last INR was 2.1.  Anticoagulation responsible provider: Tenny Craw MD, Gunnar Fusi.  INR POC: 1.4.  Cuvette Lot#: [id number].  Exp: 10/2010.    Anticoagulation Management Assessment/Plan:      The patient's current anticoagulation dose is Coumadin 5 mg tabs: Take as directed by coumadin clinic..  The target INR is 2 - 3.  The next INR is due 04/19/2010.  Anticoagulation instructions were given to patient.  Results were reviewed/authorized by Cloyde Reams, RN, BSN.  He was notified by Cloyde Reams RN.         Prior Anticoagulation Instructions: INR 2.4  Continue same dose of 1 tablet every day except 1/2 tablet on Wednesday.  Recheck INR in 4 weeks.   Current Anticoagulation Instructions: INR 1.4  Take an extra tablet today, then resume same dosage 1 tablet daily except 1/2 tablet on Wednesdays.  Recheck in 2 weeks.

## 2010-05-02 NOTE — Medication Information (Signed)
Summary: rov/ewj  Anticoagulant Therapy  Managed by: Samantha Crimes, PharmD Referring MD: Shawnie Pons MD PCP: Ralene Ok, MD Supervising MD: Daleen Squibb MD, Maisie Fus Indication 1: Atrial Fibrillation (ICD-427.31) Lab Used: [facility] Site: [facility] INR POC 2.3 INR RANGE 2 - 3  Dietary changes: no    Health status changes: no    Bleeding/hemorrhagic complications: no    Recent/future hospitalizations: no    Any changes in medication regimen? no    Recent/future dental: no  Any missed doses?: yes     Details: maybe one since last visit  Is patient compliant with meds? yes       Current Medications (verified): 1)  Carvedilol 6.25 Mg Tabs (Carvedilol) .... Take One and One-Half Tablet By Mouth Twice A Day 2)  Prilosec 20 Mg Cpdr (Omeprazole) .... Take 1 Tablet By Mouth Once A Day 3)  Pravastatin Sodium 10 Mg Tabs (Pravastatin Sodium) .... Take 1 Tablet By Mouth Once A Day 4)  Vitamin E 400 Unit Caps (Vitamin E) .... Take 1 Tablet By Mouth Once A Day 5)  Coumadin 5 Mg Tabs (Warfarin Sodium) .... Take As Directed By Coumadin Clinic. 6)  Vitamin D 1000 Unit Tabs (Cholecalciferol) .... One Tablet Once Daily 7)  Calcium 600 Mg Tabs (Calcium) .... Every Other Day 8)  B Complex  Tabs (B Complex Vitamins) .... Take 1 Tablet By Mouth Once A Day 9)  Vitamin C 500 Mg  Tabs (Ascorbic Acid) .... About 2 O 3 A Week 10)  Atacand Hct 16-12.5 Mg Tabs (Candesartan Cilexetil-Hctz) .... Take 1 Tablet Daily 11)  Gabapentin 300 Mg  Caps (Gabapentin) .... 2 Tablets Am- 1 Tablet At Lunch and 2 Tablets Pm 12)  Tramadol Hcl 50 Mg  Tabs (Tramadol Hcl) .... As Needed 13)  Omega 3,6,9  1.000mg  .... 1 Cap Daily 14)  Potassium Chloride Cr 10 Meq Cr-Caps (Potassium Chloride) .... Take One Tablet By Mouth Daily 15)  Vitamin B-12 250 Mcg Tabs (Cyanocobalamin) .... Take 1 Tablet By Mouth Once A Day  Allergies (verified): 1)  ! * Horse Serum  Anticoagulation Management History:      Positive risk factors  for bleeding include an age of 75 years or older.  The bleeding index is 'intermediate risk'.  Positive CHADS2 values include History of CHF, History of HTN, and Age > 10 years old.  The start date was 08/09/2007.  His last INR was 2.1.  Anticoagulation responsible provider: Daleen Squibb MD, Maisie Fus.  INR POC: 2.3.  Exp: 10/2010.    Anticoagulation Management Assessment/Plan:      The patient's current anticoagulation dose is Coumadin 5 mg tabs: Take as directed by coumadin clinic..  The target INR is 2 - 3.  The next INR is due 05/20/2010.  Anticoagulation instructions were given to patient.  Results were reviewed/authorized by Samantha Crimes, PharmD.         Prior Anticoagulation Instructions: INR 1.4  Take an extra tablet today, then resume same dosage 1 tablet daily except 1/2 tablet on Wednesdays.  Recheck in 2 weeks.   Current Anticoagulation Instructions: Cont with current regimen Return to clinic on February 20th at 2:15

## 2010-05-20 ENCOUNTER — Encounter: Payer: Self-pay | Admitting: Cardiovascular Disease

## 2010-05-20 ENCOUNTER — Encounter (INDEPENDENT_AMBULATORY_CARE_PROVIDER_SITE_OTHER): Payer: Medicare Other

## 2010-05-20 DIAGNOSIS — Z7901 Long term (current) use of anticoagulants: Secondary | ICD-10-CM

## 2010-05-20 DIAGNOSIS — I4891 Unspecified atrial fibrillation: Secondary | ICD-10-CM

## 2010-05-20 LAB — CONVERTED CEMR LAB: POC INR: 1.7

## 2010-05-28 ENCOUNTER — Encounter: Payer: Self-pay | Admitting: Cardiology

## 2010-05-28 DIAGNOSIS — I4891 Unspecified atrial fibrillation: Secondary | ICD-10-CM

## 2010-05-28 NOTE — Medication Information (Signed)
Summary: rov/ewj  Anticoagulant Therapy  Managed by: Weston Brass, PharmD Referring MD: Shawnie Pons MD PCP: Ralene Ok, MD Supervising MD: Clifton James MD, Cristal Deer Indication 1: Atrial Fibrillation (ICD-427.31) Lab Used: [facility] Site: [facility] INR POC 1.7 INR RANGE 2 - 3  Dietary changes: no    Health status changes: no    Bleeding/hemorrhagic complications: yes       Details: had one nosebleed 05/18/2010  Recent/future hospitalizations: yes       Details: will be having a procedure on his back mid-March  Any changes in medication regimen? no    Recent/future dental: no  Any missed doses?: yes     Details: Missed all of his medications on 05/17/10  Is patient compliant with meds? yes       Allergies: 1)  ! * Horse Serum  Anticoagulation Management History:      The patient is taking warfarin and comes in today for a routine follow up visit.  Positive risk factors for bleeding include an age of 27 years or older.  The bleeding index is 'intermediate risk'.  Positive CHADS2 values include History of CHF, History of HTN, and Age > 59 years old.  The start date was 08/09/2007.  His last INR was 2.1.  Anticoagulation responsible provider: Clifton James MD, Cristal Deer.  INR POC: 1.7.  Cuvette Lot#: [id number].  Exp: 04/2011.    Anticoagulation Management Assessment/Plan:      The patient's current anticoagulation dose is Coumadin 5 mg tabs: Take as directed by coumadin clinic..  The target INR is 2 - 3.  The next INR is due 06/10/2010.  Anticoagulation instructions were given to patient.  Results were reviewed/authorized by Weston Brass, PharmD.  He was notified by Margot Chimes PharmD Candidate.         Prior Anticoagulation Instructions: Cont with current regimen Return to clinic on February 20th at 2:15  Current Anticoagulation Instructions: INR 1.7  Take an extra tablet today and then resume your normal dose of 1 tablet everyday except on Wednesdays when you only take  1/2 tablet.  Recheck INR In 3 weeks.

## 2010-06-04 ENCOUNTER — Telehealth: Payer: Self-pay | Admitting: Cardiology

## 2010-06-06 ENCOUNTER — Ambulatory Visit (INDEPENDENT_AMBULATORY_CARE_PROVIDER_SITE_OTHER): Payer: Medicare Other | Admitting: Cardiology

## 2010-06-06 ENCOUNTER — Encounter: Payer: Self-pay | Admitting: Cardiology

## 2010-06-06 DIAGNOSIS — E785 Hyperlipidemia, unspecified: Secondary | ICD-10-CM

## 2010-06-06 DIAGNOSIS — I10 Essential (primary) hypertension: Secondary | ICD-10-CM

## 2010-06-06 DIAGNOSIS — I4891 Unspecified atrial fibrillation: Secondary | ICD-10-CM

## 2010-06-11 NOTE — Progress Notes (Signed)
Summary: sur. clearance form  Phone Note From Other Clinic   Caller: Nurse/sherri dr Otelia Sergeant # [phone] Summary of Call: sherri states pt needs sur. clearnace for lumbar surgery. sur. clearance form was fax 05/24/10.  Initial call taken by: Roe Coombs,  June 04, 2010 3:48 PM  Follow-up for Phone Call        Will forward to Dr. Riley Kill & his RN to review. Whitney Maeola Sarah RN  June 04, 2010 4:22 PM  Follow-up by: Whitney Maeola Sarah RN,  June 04, 2010 4:22 PM  Additional Follow-up for Phone Call Additional follow up Details #1::        I spoke with Sherri and the pt is not scheduled for back surgery at this time. I will schedule the pt to see Dr Riley Kill for clearance. Julieta Gutting, RN, BSN  June 04, 2010 4:31 PM  Pt scheduled to see Dr Riley Kill on 06/06/10. Julieta Gutting, RN, BSN  June 05, 2010 5:48 PM

## 2010-06-21 ENCOUNTER — Telehealth: Payer: Self-pay | Admitting: Cardiology

## 2010-06-21 NOTE — Telephone Encounter (Signed)
Pt calling re change in med-pt was taking carvedilol twice a day 6.25 mg he's having trouble cutting it in half, so he was changed to 3.125 mg 3 tabs three times a day-pt just questioning the dosage difference from 12.50 mg to over 18 mg

## 2010-06-21 NOTE — Telephone Encounter (Signed)
Pt calling c/o ? medication error--pt was changed from carvedilol 6.125 BID to carvedilol 3.125 3 tabs BID --he now thinks he is taking too much and wants to go back to 3.125 mg 2 tabs BID--advised to continue with 3.125MG  2 TABS BID and I would let Dr Riley Kill and Leotis Shames know of his concerns and one of them would call him back with correct dosage--nt

## 2010-06-25 ENCOUNTER — Telehealth: Payer: Self-pay

## 2010-06-25 NOTE — Telephone Encounter (Signed)
I spoke with Dr Riley Kill and the pt can begin holding his Coumadin today in preparation for surgery on 07/02/10. Dr Riley Kill called and spoke with the patient and gave him this information by phone.

## 2010-06-27 ENCOUNTER — Encounter (HOSPITAL_COMMUNITY)
Admission: RE | Admit: 2010-06-27 | Discharge: 2010-06-27 | Disposition: A | Discharge status: Home / Self Care | Payer: Medicare Other | Source: Ambulatory Visit | Attending: Specialist | Admitting: Specialist

## 2010-06-27 ENCOUNTER — Telehealth: Payer: Self-pay | Admitting: Cardiology

## 2010-06-27 ENCOUNTER — Other Ambulatory Visit (HOSPITAL_COMMUNITY): Payer: Self-pay | Admitting: Specialist

## 2010-06-27 DIAGNOSIS — Z01818 Encounter for other preprocedural examination: Secondary | ICD-10-CM

## 2010-06-27 LAB — TYPE AND SCREEN
ABO/RH(D): AB POS
Antibody Screen: NEGATIVE

## 2010-06-27 LAB — CBC
Platelets: 222 10*3/uL (ref 150–400)
RBC: 4.46 MIL/uL (ref 4.22–5.81)
WBC: 4.6 10*3/uL (ref 4.0–10.5)

## 2010-06-27 LAB — DIFFERENTIAL
Basophils Absolute: 0 10*3/uL (ref 0.0–0.1)
Basophils Relative: 0 % (ref 0–1)
Eosinophils Absolute: 0.1 10*3/uL (ref 0.0–0.7)
Neutro Abs: 2.7 10*3/uL (ref 1.7–7.7)
Neutrophils Relative %: 59 % (ref 43–77)

## 2010-06-27 LAB — COMPREHENSIVE METABOLIC PANEL
AST: 29 U/L (ref 0–37)
Albumin: 4 g/dL (ref 3.5–5.2)
CO2: 30 mEq/L (ref 19–32)
Calcium: 9.4 mg/dL (ref 8.4–10.5)
Creatinine, Ser: 0.79 mg/dL (ref 0.4–1.5)
GFR calc Af Amer: >60 mL/min (ref >60)
GFR calc non Af Amer: >60 mL/min (ref >60)
Total Protein: 6.4 g/dL (ref 6.0–8.3)

## 2010-06-27 NOTE — Telephone Encounter (Signed)
Faxed EKG to [place] at [facility] ([phone]).

## 2010-06-27 NOTE — Assessment & Plan Note (Signed)
Summary: surgical clearance/pt appt at 1:00/lwb   Visit Type:  OV Primary Provider:  Ralene Ok, MD  CC:  no complaints.  History of Present Illness: Patient is planning on having surgery again on his back.  He has been generally less active, but getting some exercise.  He walks some, but it hurts.  He denies chest pain or shortness of breath.  Overall he has gotten along well.  He goes up stairs pretty slow overall.  He has had R lower back pain, specifically at L2-L3.   Problems Prior to Update: 1)  Hyperglycemia  (ICD-790.29) 2)  CHF  (ICD-428.0) 3)  Atrial Fibrillation  (ICD-427.31) 4)  Edema  (ICD-782.3) 5)  Mitral Regurgitation  (ICD-396.3) 6)  Atrial Fibrillation, Hx of  (ICD-V12.59) 7)  Hypertension  (ICD-401.9) 8)  Anxiety  (ICD-300.00) 9)  Depression  (ICD-311) 10)  Dyslipidemia  (ICD-272.4) 11)  Esophageal Motility Disorder  (ICD-530.5) 12)  Coumadin Therapy  (ICD-V58.61) 13)  Ventral Hernia With Transient Incarceration  (ICD-552.20) 14)  Duodenitis  (ICD-535.60) 15)  Gastritis  (ICD-535.50) 16)  Hemorrhoids  (ICD-455.6) 17)  Diverticulosis, Colon  (ICD-562.10) 18)  Hiatal Hernia  (ICD-553.3) 19)  Barretts Esophagus  (ICD-530.85) 20)  Irritable Bowel Syndrome  (ICD-564.1) 21)  Sleep Apnea  (ICD-780.57)  Current Medications (verified): 1)  Carvedilol 6.25 Mg Tabs (Carvedilol) .... Take One and One-Half Tablet By Mouth Twice A Day 2)  Prilosec 20 Mg Cpdr (Omeprazole) .... Take 1 Tablet By Mouth Once A Day 3)  Pravastatin Sodium 10 Mg Tabs (Pravastatin Sodium) .... Take 1 Tablet By Mouth Once A Day 4)  Vitamin E 400 Unit Caps (Vitamin E) .... Take 1 Tablet By Mouth Once A Day 5)  Coumadin 5 Mg Tabs (Warfarin Sodium) .... Take As Directed By Coumadin Clinic. 6)  Vitamin D 1000 Unit Tabs (Cholecalciferol) .... One Tablet Once Daily 7)  Calcium 600 Mg Tabs (Calcium) .... Every Other Day 8)  B Complex  Tabs (B Complex Vitamins) .... Take 1 Tablet By Mouth Once A Day 9)   Vitamin C 500 Mg  Tabs (Ascorbic Acid) .... About 2 O 3 A Week 10)  Atacand Hct 16-12.5 Mg Tabs (Candesartan Cilexetil-Hctz) .... Take 1 Tablet Daily 11)  Gabapentin 300 Mg  Caps (Gabapentin) .... 2 Tablets Am- 1 Tablet At Lunch and 2 Tablets Pm 12)  Tramadol Hcl 50 Mg  Tabs (Tramadol Hcl) .... As Needed 13)  Omega 3,6,9  1.000mg  .... 2 Cap Daily 14)  Potassium Chloride Cr 10 Meq Cr-Caps (Potassium Chloride) .... Take One Tablet By Mouth Daily 15)  Vitamin B-12 250 Mcg Tabs (Cyanocobalamin) .... Take 1 Tablet By Mouth Once A Day 16)  Norco 5-325 Mg Tabs (Hydrocodone-Acetaminophen) .Marland Kitchen.. 1 Tablet Every 4 Hours As Needed  Allergies (verified): 1)  ! * Horse Serum  Past History:  Past Medical History: Last updated: 07/11/2008 ATRIAL FIBRILLATION, HX OF (ICD-V12.59) HYPERTENSION (ICD-401.9) ANXIETY (ICD-300.00) DEPRESSION (ICD-311) DYSLIPIDEMIA (ICD-272.4) ESOPHAGEAL MOTILITY DISORDER (ICD-530.5) COUMADIN THERAPY (ICD-V58.61) VENTRAL HERNIA WITH TRANSIENT INCARCERATION (ICD-552.20) DUODENITIS (ICD-535.60) GASTRITIS (ICD-535.50) HEMORRHOIDS (ICD-455.6) DIVERTICULOSIS, COLON (ICD-562.10) HIATAL HERNIA (ICD-553.3) BARRETTS ESOPHAGUS (ICD-530.85) IRRITABLE BOWEL SYNDROME (ICD-564.1) SLEEP APNEA (ICD-780.57)  Past Surgical History: Last updated: 07/11/2008 Nissen Fundoplication (1982) TURP (1478'G) Bilateral carpal tunnel (2003) Appendectomy (9562) Tonsillectomy Vasectomy Lumbar Laminectomy (1985) cervical disc 1977 Small bowel obstruction (x4) Cardiac Cath (1991) Right wrist release Central decompressive laminectomy L2-L3, L3-L4, and L4-L5,   right L4-L5 transforaminal lumbar interbody fusion with 12-mm DePuy   Concorde lordotic  cage and local bone graft.  L4-L5 internal fixation   using a pedicle screws and rods, DePuy Monarch System.  Posterolateral   fusion at L4-L5 with local bone graft.  (11/08)  Family History: Last updated: 07/11/2008 Family History of Heart  Disease: Father died at age 17 MI Mother died at age 110 Stroke  Social History: Last updated: 07/11/2008  Patient is a former smoker. -stopped 92 Alcohol Use - yes-occasional Illicit Drug Use - no Patient gets regular exercise.  Risk Factors: Exercise: yes (06/28/2008)  Risk Factors: Smoking Status: quit (06/28/2008)  Vital Signs:  Patient profile:   77 year old male Height:      72 inches Weight:      163.50 pounds BMI:     22.25 Pulse rate:   67 / minute BP sitting:   158 / 88  (left arm) Cuff size:   regular  Vitals Entered By: Caralee Ates CMA (June 06, 2010 1:04 PM)  Physical Exam  General:  Well developed, well nourished, in no acute distress.  He looks more fatigued at this point. Head:  normocephalic and atraumatic Eyes:  PERRLA/EOM intact; conjunctiva and lids normal. Lungs:  Clear bilaterally to auscultation and percussion. Heart:  Heart irregularly irregular.  No definite murmurs.  Abdomen:  Bowel sounds positive; abdomen soft and non-tender without masses, organomegaly, or hernias noted. No hepatosplenomegaly. Pulses:  pulses normal in all 4 extremities Extremities:  No clubbing or cyanosis. Neurologic:  Alert and oriented x 3.   Impression & Recommendations:  Problem # 1:  ATRIAL FIBRILLATION (ICD-427.31) Patient remains in chronic AF.  No ECG done today, but recently showed coarse AF and RBBB.  He is hemodynamically stable with this, and his drop in exercise tolerance has been largely a function of his orthopedic issues.  He had  surgery and tolerated it the last time through despite the extensive nature of the procedure.  He does not have limiting cardiac symptoms, and the main issue would be to watch his volume.  It should be noted that he is older.  He should be an aceptable candidate strictly regarding his cardiac status. His updated medication list for this problem includes:    Carvedilol 3.125 Mg Tabs (Carvedilol) .Marland Kitchen... Take three tablets by mouth  twice a day    Coumadin 5 Mg Tabs (Warfarin sodium) .Marland Kitchen... Take as directed by coumadin clinic.  Problem # 2:  EDEMA (ICD-782.3) Has not been recent issue, and watching for this.  See note above on volume.   Problem # 3:  HYPERTENSION (ICD-401.9) remains a bit high, and needs to be monitored throughout.   His updated medication list for this problem includes:    Carvedilol 3.125 Mg Tabs (Carvedilol) .Marland Kitchen... Take three tablets by mouth twice a day    Atacand Hct 16-12.5 Mg Tabs (Candesartan cilexetil-hctz) .Marland Kitchen... Take 1 tablet daily  Patient Instructions: 1)  Your physician recommends that you schedule a follow-up appointment in: 3 MONTHS 2)  Your physician recommends that you continue on your current medications as directed. Please refer to the Current Medication list given to you today. Prescriptions: CARVEDILOL 3.125 MG TABS (CARVEDILOL) Take three tablets by mouth twice a day  #180 x 11   Entered by:   Julieta Gutting, RN, BSN   Authorized by:   Ronaldo Miyamoto, MD, [facility]   Signed by:   Julieta Gutting, RN, BSN on 06/06/2010   Method used:   Electronically to        [facility]  [address]  [phone]* (retail)       [address] [place], [place]  [address]       Ph: [phone] or [phone]       Fax: [phone]   RxID:   [id number]

## 2010-06-28 ENCOUNTER — Inpatient Hospital Stay (HOSPITAL_COMMUNITY): Admission: RE | Admit: 2010-06-28 | Payer: Medicare Other | Source: Ambulatory Visit | Admitting: Orthopaedic Surgery

## 2010-07-02 ENCOUNTER — Inpatient Hospital Stay (HOSPITAL_COMMUNITY)
Admission: RE | Admit: 2010-07-02 | Discharge: 2010-07-05 | DRG: 460 | Disposition: A | Discharge status: Rehabilitation Facility | Payer: Medicare Other | Source: Ambulatory Visit | Attending: Specialist | Admitting: Specialist

## 2010-07-02 ENCOUNTER — Inpatient Hospital Stay (HOSPITAL_COMMUNITY): Payer: Medicare Other

## 2010-07-02 DIAGNOSIS — M48061 Spinal stenosis, lumbar region without neurogenic claudication: Principal | ICD-10-CM | POA: Diagnosis present

## 2010-07-02 DIAGNOSIS — M545 Low back pain, unspecified: Secondary | ICD-10-CM | POA: Diagnosis present

## 2010-07-02 DIAGNOSIS — K219 Gastro-esophageal reflux disease without esophagitis: Secondary | ICD-10-CM | POA: Diagnosis present

## 2010-07-02 DIAGNOSIS — M5136 Other intervertebral disc degeneration, lumbar region: Secondary | ICD-10-CM

## 2010-07-02 DIAGNOSIS — Z7901 Long term (current) use of anticoagulants: Secondary | ICD-10-CM

## 2010-07-02 DIAGNOSIS — G8929 Other chronic pain: Secondary | ICD-10-CM | POA: Diagnosis present

## 2010-07-02 DIAGNOSIS — I10 Essential (primary) hypertension: Secondary | ICD-10-CM | POA: Diagnosis present

## 2010-07-02 DIAGNOSIS — R339 Retention of urine, unspecified: Secondary | ICD-10-CM | POA: Diagnosis not present

## 2010-07-02 DIAGNOSIS — I4891 Unspecified atrial fibrillation: Secondary | ICD-10-CM | POA: Diagnosis present

## 2010-07-02 LAB — PROTIME-INR
INR: 1.13 (ref 0.00–1.49)
Prothrombin Time: 14.7 seconds (ref 11.6–15.2)

## 2010-07-03 LAB — BASIC METABOLIC PANEL WITH GFR
BUN: 9 mg/dL (ref 6–23)
CO2: 31 meq/L (ref 19–32)
Calcium: 8.4 mg/dL (ref 8.4–10.5)
Chloride: 105 meq/L (ref 96–112)
Creatinine, Ser: 0.83 mg/dL (ref 0.4–1.5)
GFR calc non Af Amer: >60 mL/min
Glucose, Bld: 96 mg/dL (ref 70–99)
Potassium: 4.1 meq/L (ref 3.5–5.1)
Sodium: 141 meq/L (ref 135–145)

## 2010-07-03 LAB — HEMOGLOBIN AND HEMATOCRIT, BLOOD: HCT: 32.4 % — ABNORMAL LOW (ref 39.0–52.0)

## 2010-07-04 DIAGNOSIS — IMO0002 Reserved for concepts with insufficient information to code with codable children: Secondary | ICD-10-CM

## 2010-07-04 DIAGNOSIS — M4716 Other spondylosis with myelopathy, lumbar region: Secondary | ICD-10-CM

## 2010-07-04 LAB — BASIC METABOLIC PANEL
CO2: 27 mEq/L (ref 19–32)
Chloride: 104 mEq/L (ref 96–112)
Creatinine, Ser: 0.82 mg/dL (ref 0.4–1.5)
GFR calc Af Amer: >60 mL/min (ref >60)
Sodium: 136 mEq/L (ref 135–145)

## 2010-07-04 LAB — GLUCOSE, CAPILLARY

## 2010-07-04 LAB — HEMOGLOBIN AND HEMATOCRIT, BLOOD: Hemoglobin: 11 g/dL — ABNORMAL LOW (ref 13.0–17.0)

## 2010-07-05 ENCOUNTER — Inpatient Hospital Stay (HOSPITAL_COMMUNITY)
Admission: RE | Admit: 2010-07-05 | Discharge: 2010-07-13 | DRG: 946 | Disposition: A | Discharge status: Home / Self Care | Payer: Medicare Other | Source: Other Acute Inpatient Hospital | Attending: Physical Medicine & Rehabilitation | Admitting: Physical Medicine & Rehabilitation

## 2010-07-05 DIAGNOSIS — Z5189 Encounter for other specified aftercare: Secondary | ICD-10-CM

## 2010-07-05 DIAGNOSIS — Z79899 Other long term (current) drug therapy: Secondary | ICD-10-CM

## 2010-07-05 DIAGNOSIS — I4891 Unspecified atrial fibrillation: Secondary | ICD-10-CM

## 2010-07-05 DIAGNOSIS — I10 Essential (primary) hypertension: Secondary | ICD-10-CM

## 2010-07-05 DIAGNOSIS — G4733 Obstructive sleep apnea (adult) (pediatric): Secondary | ICD-10-CM

## 2010-07-05 DIAGNOSIS — M48061 Spinal stenosis, lumbar region without neurogenic claudication: Secondary | ICD-10-CM

## 2010-07-05 DIAGNOSIS — K589 Irritable bowel syndrome without diarrhea: Secondary | ICD-10-CM

## 2010-07-05 DIAGNOSIS — K2270 Barrett's esophagus without dysplasia: Secondary | ICD-10-CM

## 2010-07-05 DIAGNOSIS — Z9889 Other specified postprocedural states: Secondary | ICD-10-CM

## 2010-07-05 DIAGNOSIS — F411 Generalized anxiety disorder: Secondary | ICD-10-CM

## 2010-07-05 DIAGNOSIS — E785 Hyperlipidemia, unspecified: Secondary | ICD-10-CM

## 2010-07-05 DIAGNOSIS — IMO0002 Reserved for concepts with insufficient information to code with codable children: Secondary | ICD-10-CM

## 2010-07-05 DIAGNOSIS — R21 Rash and other nonspecific skin eruption: Secondary | ICD-10-CM

## 2010-07-05 DIAGNOSIS — Z7901 Long term (current) use of anticoagulants: Secondary | ICD-10-CM

## 2010-07-05 DIAGNOSIS — N400 Enlarged prostate without lower urinary tract symptoms: Secondary | ICD-10-CM

## 2010-07-05 LAB — PROTIME-INR
INR: 1.68 — ABNORMAL HIGH (ref 0.00–1.49)
Prothrombin Time: 20 seconds — ABNORMAL HIGH (ref 11.6–15.2)

## 2010-07-06 DIAGNOSIS — M48061 Spinal stenosis, lumbar region without neurogenic claudication: Secondary | ICD-10-CM

## 2010-07-06 DIAGNOSIS — IMO0002 Reserved for concepts with insufficient information to code with codable children: Secondary | ICD-10-CM

## 2010-07-06 LAB — PROTIME-INR: Prothrombin Time: 21.9 seconds — ABNORMAL HIGH (ref 11.6–15.2)

## 2010-07-08 ENCOUNTER — Other Ambulatory Visit (HOSPITAL_COMMUNITY): Payer: Medicare Other

## 2010-07-08 LAB — CBC
MCH: 30 pg (ref 26.0–34.0)
MCV: 90.9 fL (ref 78.0–100.0)
Platelets: 214 10*3/uL (ref 150–400)
RDW: 14.8 % (ref 11.5–15.5)

## 2010-07-08 LAB — DIFFERENTIAL
Eosinophils Absolute: 0.2 10*3/uL (ref 0.0–0.7)
Eosinophils Relative: 6 % — ABNORMAL HIGH (ref 0–5)
Lymphs Abs: 1 10*3/uL (ref 0.7–4.0)
Monocytes Absolute: 0.5 10*3/uL (ref 0.1–1.0)
Monocytes Relative: 12 % (ref 3–12)

## 2010-07-08 LAB — COMPREHENSIVE METABOLIC PANEL
ALT: 17 U/L (ref 0–53)
Alkaline Phosphatase: 88 U/L (ref 39–117)
BUN: 8 mg/dL (ref 6–23)
CO2: 29 mEq/L (ref 19–32)
GFR calc non Af Amer: >60 mL/min (ref >60)
Glucose, Bld: 95 mg/dL (ref 70–99)
Potassium: 4.4 mEq/L (ref 3.5–5.1)
Sodium: 141 mEq/L (ref 135–145)
Total Bilirubin: 0.8 mg/dL (ref 0.3–1.2)

## 2010-07-08 LAB — PROTIME-INR: Prothrombin Time: 23 seconds — ABNORMAL HIGH (ref 11.6–15.2)

## 2010-07-08 NOTE — Op Note (Signed)
NAME:  JAKE, FUHRMANN NO.:  [id number]  MEDICAL RECORD NO.:  [id number]           PATIENT TYPE:  I  LOCATION:  5008                         FACILITY:  [facility]  PHYSICIAN:  Kerrin Champagne, M.D.   DATE OF BIRTH:  1927-06-03  DATE OF PROCEDURE:  07/02/2010 DATE OF DISCHARGE:  07/05/2010                              OPERATIVE REPORT   PREOPERATIVE DIAGNOSES:  Lumbar spinal stenosis, L2-3 and L4-5 greater than L1-2, and foraminal entrapment L4-5, status post decompression at the L3-4 and L4-5 level in the past with L4-5 fusion.  POSTOPERATIVE DIAGNOSES:  Severe lumbar spinal stenosis with lateral recess entrapment at the L2-3 and L3-4 levels, entrapping the L3 and L4 nerve roots bilaterally.  Central stenosis was mild at L1-2 and at L2-3. Solid fusion L4-5.  PROCEDURE:  Central laminectomy L2-3 carried upwards to the lateral recess at the L1-2 level and redo central laminectomy at L3-4 and L4-5. Posterolateral fusion uninstrumented at the L3-4 level utilizing local bone graft.  This is a posterolateral technique.  SURGEON:  Kerrin Champagne, MD.  ASSISTANT:  Maud Deed, PA-C.  ANESTHESIA:  General via orotracheal intubation, Kaylyn Layer. Ossey, supplemented with local infiltration, Marcaine 0.5%, 1:200,000 epinephrine, total of 20 mL.  ESTIMATED BLOOD LOSS:  450 mL.  DRAINS:  Foley to straight drain, medium Hemovac, right lower lumbar spine.  COMPLICATIONS:  None.  BRIEF CLINICAL HISTORY:  This patient is an 77 year old male who has undergone previous central decompression at L3-4 and L4-5 with TLIF and posterolateral fusion at the L4-5 level for a grade 1-2 spondylolisthesis, degenerative type, with severe lumbar spinal stenosis and bilateral foot drop.  His surgery performed nearly a year and half ago.  He has had good recovery of strength in his lower extremities with some slight worsening around the time of surgery of the left lower extremity foot drop.   The worsening did improve and he has recovered most of the strength in dorsiflexion, however, over the last 6 months, he has noticed progressive worsening of tendency to stoop and forward lean and worsening pain with standing upright and difficulty ambulating any distance.  He is having severe pain into his right lateral thigh with radiation into the right anterior thigh on the right side.  He notices weakness and attempting to stand from sitting position. Weakness in knee extension.  Weakness in hip flexion.  He has undergone extensive evaluation including MRI studies and myelograms.  These had demonstrated some persistent lumbar spinal stenosis and lateral recess stenosis at the L2-3, L3-4 levels.  Intraoperatively, it was felt that he had some mild foraminal narrowing even at the end of this case at his last procedure in October 2010.  The patient has undergone conservative management, extensive course of physical therapy, epidural steroid injections.  He is on narcotic medicines of hydrocodone intermittently, and is finding it painful now even with sitting and with lying down. After his extensive evaluations, it was felt that he warranted a redo decompression at the L3-4, L4-5 levels with extension of the central laminectomy up to the L2-3 level for findings of stenosis above the previous  areas of severe spinal stenosis in order to improve the standing and walking tolerance and his transition strength.  The patient was brought to the operating room.  He has signed informed consent.  DESCRIPTION OF PROCEDURE:  The patient was seen in the preoperative holding area, had marking of the right side in particular L2-3, L3-4, L4- 5, X and my initials.  Received standard preoperative antibiotics of Ancef.  He was then transported via stretcher after all questions were answered.  OR room #10 was used for this procedure at [facility].  There, he underwent induction of general  anesthesia, this was uneventful, and he had today intubation.  A Foley catheter was placed. He was then turned to a prone position.  Wilson frame with sliding Skytron bed was used.  All pressure points were well-padded.  The arms at the out on arm boards were 90/90, well-padded elbows.  PAS hose for both lower extremities.  He had a standard prep of the midline from the mid-dorsal spine to mid sacrum.  DuraPrep solution, draped in the usual manner, iodine Vi-Drape was used.  Incision was made, ellipsing the old incision scar in the midline, extending down to the expected L5 level and the incision was carried superiorly and additional inch and half beyond his previous scar up to the T12 level and L1 level.  Incision was ellipsing the old incision scar over the lower aspect and this was then ellipsed down to the patient's lumbodorsal fascia.  Incision was carried to the spinous process of L2 that was residual.  I then carried down along the lateral aspects of the L2 and L1 spinous processes to their bases in order to determine the depth of the incision to be made caudally.  Then, after determining this depth, incision was made in the midline, continuing from the base of the spinous process of L2 distally to about the L5 level.  This was then continued to a depth equal to that of the posterior arch of L2 in the midline.  This was then carried laterally to expose the posterior aspects of the facets at the L2-3, L3-4, and at the level of the superior pedicle screws at the L5 level and L4 level.  This in order to provide exposure for the central laminectomy at these levels.  Initially, cerebellar retractors were used and McCulloch retractors placed.  The bleeders controlled using electrocautery.  Clamps were then placed over the spinous process of L2 and at the interval between L2 and L1, identifying the L2 spinous process.  A Penfield 4 was carefully passed along the superior aspect of the  pedicle of L3, identifying the L2-3 level and its extend, this was marked.  A curette was used to carefully expose the inferior aspect of the lamina of L2 and to expose the posterior aspect of the facets at the L2-3 and L3-4 levels.  The medial aspect of the previous laminotomy site was then carefully freed up using a curette.  This was done bilaterally.  Leksell rongeur then used to remove the spinous process of L2 and about 30% of the inferior aspect of the spinous process of L1.  Thinning the posterior aspect of the lamina of L2 with a Leksell, then 3- and 2 mm Kerrisons were used to resect the central portions of the lamina at L2.  Cottonoids were used to protect this while resecting.  Osteotome was then used to resect bone of the residual portions of the neural arch at L2 bilaterally as  well as removing a small portion of the bone off the medial aspect of the facets at the L1-2 level, perhaps 10% of the most, then 3- and 4-mm Kerrisons were used to resect hypertrophic ligamentum flavum along the medial aspect of the facets at the L1-2 level, preserving majority of the facet over 80% of the facet bilaterally.  Additional ligamentum flavum was resected off of the ventral inferior aspect of lamina of L1, decompressing this segment.  Continuing the decompression, then medial facetectomy of about 10%-15% was carried out bilaterally at the L2-3 level using osteotome.  Resecting the loosened bone that had been greenstick using 3-mm Kerrison bilaterally.  The exposures and then was drilling was then carried distally to the L3-4 level here.  The left- sided L3 inferior articular process noted to be subluxed, anterior to the superior articular process of L4, and impinging upon the lateral recess and the L4 nerve root and its entry point into the patient's neural foramen and lateral recess.  This was osteotomized using osteotome and then resected from superior to inferior, decompressing  the lateral recess out to the pedicle of L4.  The nerve root was carefully freed up using a Penfield 4 off of the medial facet and scar tissue was carefully dissected to free up this nerve root.  The nerve appeared to be severely pinched particularly on the left side beneath the subluxed joint.  Additionally, exposure was obtained on the opposite side and osteotomes again used to resect bone over the medial aspect of the facet at L3-4 level.  On the right side, a large amount of the facet was resected to the point where the facet joint was felt to be compromised and therefore posterolateral fusion was performed.  The inferior articular process of L3 on this side was resected over 50% and on the opposite side nearly 30%-40% so that a combined total of over 50% of the facets had been resected and it was felt that posterolateral fusion was necessary.  Ligamentum flavum was found to be present impressing upon the thecal sac and on the entry point to the L3 nerve roots at the L3-4 level and the reflected portion of the flavum and the superior portion of the L4 superior articular processes were resected bilaterally in order to decompress these neural foramen.  The four roots were identified within the lateral recess after resection of bone overlying these lateral recesses and then careful decompression carried out over the residual pars at the L4 level and then out the L4 neural foramen on both the right and left side.  Then, decompressing out to the neural foramen was felt that the neural foramen of L4 and L3 had been well decompressed on lateral recesses and L2-3 and L3-4 had been well decompressed.  Posterior lateral fusion was then performed by exposing out over the L2-3 level to expose the transverse process of L3 bilaterally.  The superior fastener for the pedicle screw rod construct of the L4-5 level was used as an anatomic marker and carefully the base of the screws bilaterally were  freed up of scar tissue and the bony anatomical landmarks of the intersection of the transverse process and lateral aspect of the pedicles was carefully identified.  These were debrided of scar tissue.  High-speed bur then used to carefully decorticate transverse process of L3 bilaterally as well as at the base of the pedicle screw fastener and its intersection with the posterolateral aspect of the pedicle and transverse process of L4. After decorticating  here, bone graft that had been harvested from the central laminectomy site.  The spinous process of L2-1 and then the areas of medial facetectomy were carefully morselized and soft tissue remnants removed and these were then packed over the areas of decorticated transverse process of L3 along the pars area of L3 and down to the facets and the superior articular process of L4 bilaterally and the lateral aspects of the pedicle of L4 bilaterally.  The bone graft was then carefully placed with small portions of Gelfoam.  Bleeders were controlled using bipolar electrocautery.  Bone wax were appropriate, removing excess bone wax.  This completed the laminectomy and the fusion of the L3-4 level utilizing local bone graft.  Irrigation was carried out.  The neural foramen appeared to be well clear of any residual ligamentum flavum or bone material such as the hockey stick neural probe could be passed out bilaterally L3-L4 neural foramen, demonstrating their patency both sides.  With this then irrigation was further carried out.  Small portion of Gelfoam was placed over the posterior aspect of the central laminotomy site.  Medium Hemovac drain placed in the depth of the incision, exiting over the right lower lumbar spine.  The midline incision site was approximated loosely using interrupted #1 Vicryl sutures, paralumbar muscles here.  The patient's lumbodorsal fascia was then approximated with interrupted #1 Vicryl sutures.  Deep subcu  layers approximated with interrupted 0 then 2-0 Vicryl sutures for the more superficial fascial layers and then the skin closed with a running subcu stitch of 4-0 Vicryl.  Dermabond was then applied, was dried, and Mepilex bandage applied to the incision site posteriorly as well as over the right iliac crest area.  Loupe magnification headlamp was used throughout the case.  The patient was then returned to a supine position, reactivated, extubated, and returned to recovery room in satisfactory condition.  Note that at the beginning of this case, time- out protocol was carried out and the patient was identified and the expected procedure of central laminectomy at L2-3 and L3-4 was discussed, estimated blood loss, and the length of procedure as well. The patient did require a bone grafting for resection of bone and over 50% of the facets at the L3-4 level for decompression at this segment.  PHYSICIAN ASSISTANT'S RESPONSIBILITY:  Maud Deed performed the duties of assistant physician during this procedure.  She was present from the beginning of the case to the very end of the case.  She performed careful retraction of nerve roots and neural structures throughout the operation and careful retraction of the nerve roots and careful suctioning in order to keep the field and clear of blood and debris.  She helped with morcellation of bone graft for bone grafting purposes.  She performed closure of the incision from the fascia to the skin and application of dressing.  Assisted in both the initial positioning of this patient and the removal of the patient from the operating room table at the end of the case.     Kerrin Champagne, M.D.     JEN/MEDQ  D:  07/05/2010  T:  07/06/2010  Job:  [id number]  Electronically Signed by Vira Browns M.D. on 07/08/2010 05:10:33 PM

## 2010-07-10 DIAGNOSIS — IMO0002 Reserved for concepts with insufficient information to code with codable children: Secondary | ICD-10-CM

## 2010-07-10 DIAGNOSIS — M48061 Spinal stenosis, lumbar region without neurogenic claudication: Secondary | ICD-10-CM

## 2010-07-10 NOTE — H&P (Signed)
NAME:  Ian Sullivan, Ian Sullivan NO.:  [id number]  MEDICAL RECORD NO.:  [id number]           PATIENT TYPE:  I  LOCATION:  4007                         FACILITY:  [facility]  PHYSICIAN:  Erick Colace, M.D.DATE OF BIRTH:  1927/04/02  DATE OF ADMISSION:  07/05/2010 DATE OF DISCHARGE:                             HISTORY & PHYSICAL   REASON FOR ADMISSION:  Decline in function due to radiculopathy as well as lumbar stenosis status post decompression L2-3, L3-L5.  HISTORY OF PRESENT ILLNESS:  An 77 year old male with chronic AFib on chronic Coumadin with prior history of L2-3, L3-4, L4-5 laminectomies in 2009.  He was admitted July 02, 2010, with increasing low back pain going down primarily to the right lower extremity around the hip and thigh area.  X-ray and MRI imaging demonstrating L2-3 stenosis.  He underwent L2-3 laminotomy as well as redo L3-L5.  He has been braced when out of bed.  Postop pain control with narcotics resulted in some lethargy, mental status changes, confusion, decreased O2 sats and is now just on Vicodin with resolution of above.  INR 1.68 on July 05, 2010.  Physical therapy, occupational therapy has been working with the patient but he has been slow to progress.  PM and R consult was recommended and obtained performed on July 04, 2010, found to be a good rehab candidate.  REVIEW OF SYSTEMS:  Positive for palpitations.  Positive for back pain. Positive for weakness and anxiety.  PAST HISTORY: 1. AFib. 2. Hypertension. 3. OSA but no CPAP. 4. Barrett esophagus. 5. Anxiety. 6. Hyperlipidemia. 7. IBS. 8. BPH. 9. SBO.  PAST SURGICAL HISTORY:  Appendectomy, bilateral carpal tunnel release.  SOCIAL HISTORY:  Widowed, lives alone, son in [place], works, neighbor checks on as needed, 1-level home 6 steps to entry.  FUNCTIONAL HISTORY:  Independent.  HOME MEDICATIONS:  Atacand, hydrochlorothiazide Coumadin, Neurontin, omega-3, Coreg,  pravastatin, hydrocodone, Prilosec, calcium, KCl, vitamin D.  RECENT LABORATORY DATA:  Hemoglobin 11.0.  Sodium 136, potassium 4.0, BUN 9, creatinine 0.8.  PHYSICAL EXAMINATION:  GENERAL:  Well-developed, well-nourished elderly male in no acute distress. VITAL SIGNS:  Blood pressure 115/67, pulse 83, respirations 18, temperature 99. HEENT:  Eyes anicteric, noninjected.  External ENT normal. NECK:  Supple without adenopathy. RESPIRATORY:  Effort is good.  Lungs are clear.  He has tenderness over the right pectoralis muscle.  He states he has pain with reaching towards the left side with his right arm as well. HEART:  Regular rate and rhythm.  No rubs, murmurs, or extra sounds. ABDOMEN:  Positive bowel sounds, soft, nontender to palpation. EXTREMITIES:  Without edema. NEURO:  The sensation is mildly reduced at left S1 as well as left L4 dermatomal distribution.  His motor strength is 5/5 bilateral upper extremities with the exception of left deltoid, which is 4, he does have chronic atrophy, left sternocleidomastoid, left upper trapezius.  Upper extremity sensory exam is normal.  Lower extremity strength is 4+ throughout hip flexors, quad TA, gastroc.  Mood, memory, affect, judgment, orientation are all intact.  Deep tendon reflexes are 1+ bilateral knees, absent bilateral ankles.  POST ADMISSION  PHYSICIAN EVALUATION: 1. Functional deficits secondary to lumbar stenosis with radiculopathy     mainly preoperatively at the right L2-3 but also has some L4 and S1     distribution on the left side. 2. The patient admitted to receive collaborative interdisciplinary     care between the physiatrist, rehab nursing staff and therapy team. 3. The patient's level of medical complexity and substantial therapy     needs in context of that medical necessity cannot be provide at a     lesser intensity of care. 4. The patient has experienced substantial functional loss from his     baseline.   Functional assessment at the time of preadmission     screening the patient was at a min assist level bed mobility, min     to mod transfers, min assist ambulation with a walker 150 feet.     Currently, the patient is at a min assist bed mobility, min to mod     assist transfers, min assist gait 150 feet rolling walker, mod     assist lower body ADLs.  Judging by the patient's diagnosis,     physical exam and functional history, the patient has a potential     to make functional progress which will result in measurable gains     while on inpatient rehab.  These gains will be of substantial and     practical use upon discharge to home in facilitating mobility, self-     care and independence.  Interim changes medical status since     preadmission screening are detailed in the history of present     illness. 5. Physiatrist will provide 24-hour management of medical needs as     well as oversight of therapy plan/treatment and provide guidance as     appropriate regarding interactions of the two. 6. A 24-hour rehab nursing will assess in the management of skin,     bowel, bladder to help integrate therapy concepts, techniques and     education. 7. PT will assess and treat for pre gait training, gait training,     endurance, mobility, equipment.  Goals are for modified independent     level with all mobility. 8. OT will assess and treat for ADLs, equipment safety, endurance,     maintaining of precautions.  Goals are for modified independence to     set up for ADLs. 9. Case management and social worker will assess and treat for     psychosocial issues and discharge planning. 10.Team conferences will be held weekly to assess the patient's     progress/goals and to determine barriers to discharge. 11.The patient has demonstrated sufficient medical stability and     exercise capacity to tolerate at least 3 hours of therapy per day     at least 5 days per week. 12.Estimated length of stay is  7-12 days.  Prognosis for further     functional improvement is good.  MEDICAL PROBLEM LIST AND PLAN: 1. DVT prophylaxis.  Coumadin for pain management, Vicodin, as well as     Neurontin. 2. Chronic AFib rate controlled on Coreg. 3. Hyperlipidemia, Zocor and Lovaza. 4. Barrett esophagus, Protonix. 5. BPH, check PVRs.  May need alpha blocker. 6. Hypertension, Coreg, hydrochlorothiazide and Benicar.  Motivation appears to be good for inpatient rehab.  Mood appears to be adequate fully, participate Rehab Medicine Services explained.  Questions answered.     Erick Colace, M.D.     AEK/MEDQ  D:  07/05/2010  T:  07/06/2010  Job:  [id number]  cc:   Ralene Ok, M.D. Kerrin Champagne, M.D. Arturo Morton. Riley Kill, MD, [facility]  Electronically Signed by Claudette Laws M.D. on 07/10/2010 10:18:49 AM

## 2010-07-11 LAB — PROTIME-INR
INR: 1.93 — ABNORMAL HIGH (ref 0.00–1.49)
Prothrombin Time: 22.2 seconds — ABNORMAL HIGH (ref 11.6–15.2)

## 2010-07-12 DIAGNOSIS — IMO0002 Reserved for concepts with insufficient information to code with codable children: Secondary | ICD-10-CM

## 2010-07-12 DIAGNOSIS — M48061 Spinal stenosis, lumbar region without neurogenic claudication: Secondary | ICD-10-CM

## 2010-07-18 ENCOUNTER — Ambulatory Visit (INDEPENDENT_AMBULATORY_CARE_PROVIDER_SITE_OTHER): Payer: Medicare Other | Admitting: *Deleted

## 2010-07-18 DIAGNOSIS — I4891 Unspecified atrial fibrillation: Secondary | ICD-10-CM

## 2010-07-26 ENCOUNTER — Ambulatory Visit (INDEPENDENT_AMBULATORY_CARE_PROVIDER_SITE_OTHER): Payer: Medicare Other | Admitting: *Deleted

## 2010-07-26 DIAGNOSIS — I4891 Unspecified atrial fibrillation: Secondary | ICD-10-CM

## 2010-07-26 LAB — POCT INR: INR: 2.8

## 2010-07-31 NOTE — Discharge Summary (Signed)
NAME:  Ian Sullivan, Ian Sullivan NO.:  [id number]  MEDICAL RECORD NO.:  [id number]           PATIENT TYPE:  I  LOCATION:  4007                         FACILITY:  [facility]  PHYSICIAN:  Ranelle Oyster, M.D.DATE OF BIRTH:  01-30-1928  DATE OF ADMISSION:  07/05/2010 DATE OF DISCHARGE:  07/13/2010                              DISCHARGE SUMMARY   DISCHARGE DIAGNOSES:  Lumbar stenosis with radiculopathy status post lumbar L2-3 laminectomy with redo lumbar L3-5 on July 02, 2010, pain management, chronic Coumadin for atrial fibrillation, hypertension, obstructive sleep apnea, hyperlipidemia, Barrett's esophagus, and benign prostatic hypertrophy.  A 77 year old white male with chronic Coumadin therapy for atrial fibrillation, history of back surgery, lumbar laminectomy 2009, admitted April 3 with increased low back pain radiating to the lower extremities. X-rays and imaging showed lumbar L2-3 stenosis with radiculopathy. Underwent lumbar L2-3 laminectomy as well as redo lumbar L3-L5 by Dr. Otelia Sergeant on July 02, 2010.  Back brace when out of bed.  Postoperative pain control with limited narcotics secondary to lethargy and bouts of confusion.  His chronic Coumadin had been resumed with latest INR of 1.68.  He was minimal assist for mobility.  He was admitted for comprehensive rehab program.  PAST MEDICAL HISTORY:  See discharge diagnoses.  Remote smoker. Occasional alcohol.  ALLERGIES:  TETANUS.  SOCIAL HISTORY:  He is widowed.  He lives alone.  Son in [place] works.  He has a neighbor to do checks on him.  He lives in a 1-level home with 6 steps to entry.  Son and family to arrange assistance on discharge.  Functional history prior to admission was independent. Functional status upon admission to rehab services, minimal assist bed mobility, minimum moderate assist transfers, minimal assist ambulate 150 feet with rolling walker, moderate assist for lower body activities  of daily living.  MEDICATIONS PRIOR TO ADMISSION: 1. Atacand/HCT 16/12.5 mg daily. 2. Coumadin 5 mg daily. 3. Neurontin 300 mg 3 times daily. 4. Omega-3 twice daily. 5. Coreg 3.125 mg 3 tablets twice daily. 6. Pravastatin 10 mg daily. 7. Hydrocodone as needed. 8. Prilosec 20 mg daily. 9. Calcium daily. 10.Potassium chloride 10 mEq twice daily. 11.Vitamin B and vitamin D daily.  PHYSICAL EXAMINATION:  VITAL SIGNS:  Blood pressure 115/67, pulse 83, temperature 99, respirations 18. GENERAL:  This was an alert male in no acute distress, oriented x3. NEUROLOGIC:  Deep tendon reflexes 2+.  Sensation decreased to light touch distally to the lower extremities. BACK:  Incision clean and dry with Durabond in place. LUNGS:  Clear to auscultation. CARDIAC:  Irregularly irregular. ABDOMEN:  Soft, nontender.  Good bowel sounds.  REHABILITATION HOSPITAL COURSE:  The patient was admitted to inpatient rehab services with therapies initiated on a 3-hour daily basis consisting of physical therapy, occupational therapy at 24-hour rehabilitation nursing.  The following issues were addressed during the patient's rehabilitation stay.  Pertaining to Mr. Radick's lumbar stenosis, radiculopathy with lumbar L2-3 laminectomy and redo lumbar L3- L5, surgical site healing nicely.  He would follow up Dr. Otelia Sergeant, orthopedic services.  Neurovascular sensation intact.  He was wearing a back brace when out of bed.  Pain control monitored closely secondary to sensitivity to narcotics.  He had been placed on Neurontin as well as a low-dose Duragesic patch as well as Vicodin for breakthrough pain with good results and monitored closely.  He did have a bed alarm in place for his safety.  This was later discontinued.  It was discussed with family the need for supervision for his safety.  He did have history of chronic atrial fibrillation maintained on Coumadin therapy.  Latest INR of 1.93.  He would follow up with   [facility] for ongoing care concerning his Coumadin therapy.  He would continue with Zocor and Lovaza for hyperlipidemia.  He had no bowel or bladder disturbances. Noted history of TURP for benign prostatic hypertrophy.  Latest bladder scans were good.  The patient received weekly collaborative interdisciplinary team conferences to discuss estimated length of stay, family teaching, and any barriers to discharge.  He was supervision overall for activities of daily living, supervision minimal assist for ambulation with a straight point cane 150 feet, minimal assist for flight of stairs, supervision transfers.  His strength and endurance greatly improved.  Home health therapies would be ongoing and dictated per rehab services.  Latest labs showed an INR of 1.93.  Sodium 141, potassium 4.4, BUN 8, creatinine 0.7, hemoglobin 10.6, hematocrit 32.1, platelet 214,000, WBC of 4.4.  DISCHARGE MEDICATIONS: 1. Coumadin daily, current dose of 5 mg adjusted for INR of 2.0-3.0. 2. Vitamin D 1000 units daily. 3. Vitamin B12 1000 mcg daily. 4. Colace 100 mg twice daily. 5. Neurontin 100 mg 3 tablets daily. 6. Hydrochlorothiazide 12.5 mg daily. 7. Benicar 20 mg daily. 8. Lovaza 1 gram twice daily. 9. Protonix 40 mg daily. 10.MiraLax 17 grams with 8 ounces of water daily, hold for loose     stool. 11.Potassium chloride 10 mEq twice daily. 12.Zocor 5 mg daily. 13.Vitamin D 400 units daily. 14.Os-Cal 500 mg daily. 15.Vitamin B with C daily. 16.Coreg 12.5 mg daily. 17.Duragesic patch 25 mcg changed every 72 hours, dispensed 5 patches. 18.Vicodin 5/325, 1 or 2 tablets every 4 hours as needed for pain,     dispensed 90 tablets.  DIET:  Regular.  SPECIAL INSTRUCTIONS:  Back brace when out of bed.  The patient may sit at edge of bed to place and remove brace.  The patient should follow up with Dr. Otelia Sergeant, Orthopedic Services 2 weeks, call for appointment. Follow up with the [facility] as advised for ongoing Coumadin care for atrial fibrillation.  Follow up with Dr. Faith Rogue at the outpatient rehab service office as needed, Dr. Ludwig Clarks, medical management 2 weeks.  Home health therapies had been arranged. It was discussed with family the need for supervision for patient safety as well as no driving.     Mariam Dollar, P.A.   ______________________________ Ranelle Oyster, M.D.    DA/MEDQ  D:  07/12/2010  T:  07/12/2010  Job:  [id number]  cc:   Ranelle Oyster, M.D. Ralene Ok, M.D. Arturo Morton. Riley Kill, MD, [facility] Kerrin Champagne, M.D.  Electronically Signed by Mariam Dollar P.A. on 07/12/2010 01:05:45 PM Electronically Signed by Faith Rogue M.D. on 07/31/2010 09:41:08 PM

## 2010-08-09 ENCOUNTER — Ambulatory Visit (INDEPENDENT_AMBULATORY_CARE_PROVIDER_SITE_OTHER): Payer: Medicare Other | Admitting: *Deleted

## 2010-08-09 DIAGNOSIS — I4891 Unspecified atrial fibrillation: Secondary | ICD-10-CM

## 2010-08-09 LAB — POCT INR: INR: 3.5

## 2010-08-13 NOTE — Assessment & Plan Note (Signed)
[facility]                            CARDIOLOGY OFFICE NOTE   RAINEY, KAHRS                      MRN:          [id number]  DATE:11/02/2007                            DOB:          September 14, 1927    Mr. Gallina is in for followup.  As a general rule, he has been stable  from a cardiac standpoint.  He had developed atrial fibrillation  previously, and at that time we debated whether or not to just leave him  in atrial fibrillation on Coumadin anticoagulation or whether he should  be cardioverted.  His echocardiogram was last done in May.  At that  time, his overall LV function and size was normal.  He did have some  evidence of mitral regurgitation that was graded as 2/4+.  His  examination is less prominent than that.  His last echo was done in May  2007; at that time, he was determined to be mild mitral regurgitation.  Estimated RV systolic pressure is less than 30, however.   MEDICATIONS:  Include,  1. Lotrel 5/20 daily.  2. Tramadol 3-4 times daily.  3. Prilosec 20 mg daily.  4. Vitamin E 400 mg daily.  5. Multivitamin one-half tablet daily.  6. Flaxseed oil.  7. Pravastatin 10 mg daily.  8. Vitamin D.  9. Vitamin C.  10.Warfarin anticoagulation with an INR goal of 2-3.   PHYSICAL EXAMINATION:  VITAL SIGNS:  Blood pressure is 150/88, the pulse  is 76.  RESPIRATORY:  The lung fields are clear.  CARDIAC:  The cardiac rhythm is irregularly irregular. A loud MR murmur  is not present on examination today.   The electrocardiogram demonstrates atrial fibrillation with controlled  ventricular response and right bundle branch block.   IMPRESSION:  The patient underwent cardiac catheterization in 1992.  He  did not have significant coronary disease at that time.  He has not had  recurrent chest pain or ischemic-type symptoms all along.  His overall  exercise activity has been relatively limited, prior to that, he did not  have ischemic-type  chest pain as well.  He has not had clinical heart  failure.  He does not have evidence of significant aortic stenosis.  He  may have mild-to-moderate mitral regurgitation, but as he approaches 80  and has atrial fibrillation, he has been treated conservatively.  His  back has been bothering him rather substantially.  I doubt that anything  that we do is going to change the recommendation with regard to surgical  intervention.  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  If so, he would need to be off his Coumadin for  a minimum of 5 days.  We talked about the approach to this in some detail today.  We will  follow closely during his hospitalization.     Arturo Morton. Riley Kill, MD, [facility]  Electronically Signed    TDS/MedQ  DD: 11/02/2007  DT: 11/03/2007  Job #: [id number]

## 2010-08-13 NOTE — Assessment & Plan Note (Signed)
[facility]                            CARDIOLOGY OFFICE NOTE   Ian Sullivan, Ian Sullivan                      MRN:          [id number]  DATE:02/02/2007                            DOB:          1927-11-25    Ian Sullivan is in for followup.  In general, he is doing really quite  well.  He did not have any major complaints today.  He says that the  episodes of palpitations that he has had in the past are clearly less  than they have been in the past.  When he has them, they are more  intense, but they only last for a few seconds.  He denies any ongoing  chest pain.  He had a recent chest x-ray that was unremarkable.  He had  his hand fixed.   CURRENT MEDICATIONS INCLUDE:  1. Atenolol 25 mg daily.  2. Amlodipine.  3. Benazepril 5/20.  4. Prilosec 20 mg daily.  5. Aspirin 81 mg daily.  6. Tramadol 50 mg daily.  7. Vitamin E 400 international units daily.  8. B12 250 micrograms daily.  9. Vitamin C 500 mg daily.  10.Pravastatin 10 mg daily.  11.Vitamin D 400 international units daily.   PHYSICAL EXAMINATION:  He is alert and oriented, in no distress.  Blood pressure is 130/70, pulse is 55.  The lung fields are clear.  The cardiac rhythm is regular.  There is a soft systolic ejection  murmur.   EKG today reveals sinus bradycardia with right bundle branch block.   IMPRESSION:  1. Long history of tachy palpitations, reasonably stable.  2. Mild pulmonary abnormality with prior CT scan revealing mild lung      hyperexpansion and bibasilar scarring.  3. History of hypertension.   PLAN:  1. Return to clinic in six months.  2. Two-D echocardiogram at that time.  3. Continue current medical regimen.    Arturo Morton. Riley Kill, MD, [facility]  Electronically Signed   TDS/MedQ  DD: 02/02/2007  DT: 02/03/2007  Job #: [id number]

## 2010-08-13 NOTE — Assessment & Plan Note (Signed)
[facility]                            CARDIOLOGY OFFICE NOTE   CAIDE, CAMPI                      MRN:          [id number]  DATE:08/24/2007                            DOB:          05-04-1927    Mr. Gladden is in for followup.  His INR is now 1.8.  He is tolerating his  atrial fibrillation.  He did not increase his atenolol even though we  talked about increasing the atenolol to 25 mg b.i.d. for slightly better  rate control.  He denies any chest pain.  We had a lengthy discussion  today about atrial fibrillation, its consequences, and its possible  treatment options.  We discussed cardioversion versus no cardioversion,  long-term anticoagulation, results of AFFIRM trial, and general  strategies.   Today, on physical, the blood pressure is 130/80 and pulse is 75 and  irregularly irregular.   His EKG today reveals atrial fibrillation with controlled ventricular  response.  There is a right bundle branch block.   IMPRESSION:  1. New onset atrial fibrillation with controlled ventricular response.  2. Normal thyroid function.   PLAN:  1. Return to clinic in four weeks.  2. Continue followup in the Coumadin Clinic.  3. He will read about atrial fibrillation in the interim.     Arturo Morton. Riley Kill, MD, [facility]  Electronically Signed    TDS/MedQ  DD: 08/24/2007  DT: 08/24/2007  Job #: [id number]

## 2010-08-13 NOTE — Op Note (Signed)
NAME:  Ian Sullivan, Ian Sullivan NO.:  [id number]   MEDICAL RECORD NO.:  [id number]          PATIENT TYPE:  AMB   LOCATION:  DSC                          FACILITY:  [facility]   PHYSICIAN:  Kerrin Champagne, M.D.   DATE OF BIRTH:  03-Aug-1927   DATE OF PROCEDURE:  12/15/2006  DATE OF DISCHARGE:                               OPERATIVE REPORT   PREOPERATIVE DIAGNOSIS:  Right wrist stenosing tenosynovitis, first  dorsal compartment (de Quervain's disease).   POSTOPERATIVE DIAGNOSIS:  Right wrist stenosing tenosynovitis, first  dorsal compartment (de Quervain's disease).   PROCEDURE:  Right wrist first dorsal compartment release for stenosing  tenosynovitis.   SURGEON:  Kerrin Champagne, M.D.   ASSISTANT:  None.   ANESTHESIA:  Bier block, right forearm, supplemented with local  infiltration with Marcaine 0.5% plain 5 mL, Dr. Judie Petit.   FINDINGS:  A thickened fibrotic right wrist dorsal ligament impinging on  the first dorsal compartment extensor pollicis brevis and abductor  pollicis longus tendons.   SPECIMENS:  None.   DISPOSITION OF SPECIMENS:  Not applicable.   ESTIMATED BLOOD LOSS:  5 mL.   COMPLICATIONS:  None.   The patient returned to the PACU in good condition.   HISTORY OF PRESENT ILLNESS:  The patient is a 77 year old male who has  been treated for a persistent right wrist pain with de Quervain's  disease.  He has undergone injection treatment without relief of pain  and splinting over a period of several months.  The pain has persisted  as severe over the dorsal radial aspect of the right wrist in the region  of the first dorsal compartment.  Positive Finkelstein's test.  Pain  relieved with injection that only recurred after further use of the arm.  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  Apparently though, this does  seem to bring on his symptoms.   INTRAOPERATIVE FINDINGS:  As above.   DESCRIPTION OF PROCEDURE:  After adequate Bier block  anesthesia, the  right upper extremity was prepped from the fingertips to the mid forearm  with DuraPrep solution, draped in the usual manner.  The oblique  incision extending from volar distal to dorsal proximal approximately  3.5 to 4 cm in length overlying the first dorsal compartment, a large  vein over the dorsal radial aspect was carefully preserved.  The  incision through the skin and then the subcutaneous layers were  carefully spread with Andria Meuse scissors down to the fibrotic dorsal wrist  ligament that was present.  At first, this was felt to represent  Lister's tubercle and with careful incision, the flexor carpi radialis  tendon was identified deep in this area.  This was left intact and then  the incision then made over this fibrotic tendinous mass that was found  to represent thickened fibrotic dorsal wrist ligament that appeared to  be impinging upon the first dorsal compartment that was quite thickened  down about 2 to 3 mm.  This was incised from proximal to distal leaving  a thin portion of the ligament distally to prevent the tendons from  subluxing later.  Approximately a 4 mm x 1.5 cm portion of the ligament  was resected.  Each of the individual tendons  representing the abductor  pollicis longus tendons and extensor pollicis brevis tendons were  carefully lifted free of the compartment.  The synovium found to be  hypertrophic was removed off of the tendons using sharp dissection.  With this completed, the wrist flexed and radial deviation, the tendons  again placed through motion of the thumb demonstrating normal motion  through the compartment without evidence of further compression.  Irrigation was then carried out.  There was some tendency for bleeding  in spite of the Bier block and an Esmarch bandage was necessary to  enhance the tourniquet effect at the initiation of procedure.  Loupe  magnification was used.  Following irrigation, then the very superficial   layers were approximated with 2-0 Vicryl sutures and the skin  reapproximated with 4-0 nylon sutures in a vertical mattress fashion.  A  rubber band drain placed with suture attached for lateral removal.  A  dressing then applied of 4 x 4's after Adaptic and a sterile Webril.  A  volar splint was applied to be removed in the next 4 or 5 days.  The  patient then had removal of Bier block tourniquet.  He was then returned  to the recovery room in satisfactory condition.   POSTOPERATIVE CARE:  The patient will elevate the wrist, use ice  locally.  With range of motion, will treat the hand as normally as  possible over the next 1 to 2 days, be seen back in the office in 2 days  for removal of the small rubber band drain.  Vicodin for discomfort, 1  to 2 q.4-6 h. p.r.n. pain, maximum 8 a day #40.      Kerrin Champagne, M.D.  Electronically Signed     JEN/MEDQ  D:  12/15/2006  T:  12/15/2006  Job:  [id number]

## 2010-08-13 NOTE — Assessment & Plan Note (Signed)
[facility]                            CARDIOLOGY OFFICE NOTE   MATHIUS, BIRKELAND                      MRN:          [id number]  DATE:01/24/2008                            DOB:          1927-05-12    Ian Sullivan is in for followup.  He really is doing quite well.  He denies  any ongoing chest pain or shortness of breath.  He feels really quite  good.  He is recovering from his extensive back surgery, and he went  through this relatively smoothly without any hiccups.  Unfortunately, he  may need to have an injection and his Coumadin is again on hold for the  next several days.   His medications include:  1. Lotrel 50/20 daily.  2. Prilosec 20 mg daily.  3. Vitamin E daily.  4. Vitamin B.  5. Flaxseed oil.  6. Pravastatin 10 mg daily.  7. Vitamin D.  8. Vitamin C.  9. Coumadin.  10.Atenolol 50 mg daily.   On physical examination, he is alert and oriented in no distress.  The  lung fields are clear.  The cardiac exam is irregularly irregular with  what amounts to a controlled ventricular response on examination at 72.  He is in brace to keep his back straightened.   IMPRESSION:  1. Atrial fibrillation, persistent with controlled ventricular      response on Coumadin anticoagulation, now on hold for an additional      lumbar injection.  2. Status post surgery for lumbar spinal stenosis at L2-3, 3-4, 4-5      with L4-5 degenerative spondylolisthesis grade 1 status post      repair.   PLAN:  1. Return to clinic in 2-3 months.  2. Continue current medical regimen.  3. Resume Coumadin when instructed to by the team.     Arturo Morton. Riley Kill, MD, [facility]  Electronically Signed    TDS/MedQ  DD: 01/26/2008  DT: 01/26/2008  Job #: [id number]

## 2010-08-13 NOTE — Assessment & Plan Note (Signed)
[facility]                            CARDIOLOGY OFFICE NOTE   Ian Sullivan, Ian Sullivan                      MRN:          [id number]  DATE:09/21/2007                            DOB:          1927-10-21    Mr. Kreitzer is in for a followup visit.  To briefly summarize, he really  is doing quite well.  His wife is still at a rehabilitation facility.  She has had a fairly complicated course getting back to baseline.  He  denies any chest pain and his overall exercise tolerance remains really  quite good.  As a result, he and I had a very long discussion today  regarding potential options with regard to his atrial fibrillation.  He  has not been particularly symptomatic, his wife takes Coumadin as well.  He has tolerated this quite nicely without any difficulty.  He does  notice a little bit of perspiration especially in the under arms now.  He denies progressive shortness of breath.  From his last visit of Aug 24, 2007, his medications have not changed.   On examination today, blood pressure is 144/89, the pulse is 77,  irregularly irregular.  Lung fields are basically unchanged.  There is  not a significant murmur noted.   The electrocardiogram demonstrates atrial fibrillation with underlying  right bundle-branch block.   Most recent echocardiogram read Aug 10, 2007, reveals ejection fraction  of 55%-60%.  There is a pattern of mild concentric hypertrophy.  There  is mitral regurgitation which is grade 2 on a scale of 0 to 4.  There  was mild increase in aortic valve thickness and mild aortic  regurgitation.  Left atrial and right atrium are moderately dilated.   He has not been particularly symptomatic and we have had a fairly  thorough discussion about possible options.  The patient is 77 years of  age, and hemodynamically stable.  The risk of ultimate failure of  maintenance of normal sinus rhythm is moderately high, and given these  findings, we  discussed the likelihood of just continuing him on Coumadin  anticoagulation with rate control.  He is agreeable to this.  We will  see him back in followup in 2 months and he will continue to follow in  the Coumadin Clinic.     Arturo Morton. Riley Kill, MD, [facility]  Electronically Signed    TDS/MedQ  DD: 09/21/2007  DT: 09/22/2007  Job #: [id number]

## 2010-08-13 NOTE — Assessment & Plan Note (Signed)
[facility]                                 ON-CALL NOTE   MARKUS, CASTEN                      MRN:          [id number]  DATE:02/27/2008                            DOB:          [birth year]    HISTORY:  I spoke with Mr. Higham by phone.  They are contemplating  another surgical procedure.  He has had some swelling in the feet, left  greater than right.  Nonetheless, he has been remaining on Coumadin  anticoagulation continuously up until actually today. He is scheduled to  have a myelogram on Wednesday, the first day I am back so I told him I  would be happy to see him in the office on Friday or sooner if he felt  that was necessary.  He saw his primary care doctor on Friday and some  laboratory studies were obtained so he will need to get this when he  comes in.     Arturo Morton. Riley Kill, MD, [facility]  Electronically Signed    TDS/MedQ  DD: 02/27/2008  DT: 02/27/2008  Job #: [id number]

## 2010-08-13 NOTE — Assessment & Plan Note (Signed)
[facility]                         GASTROENTEROLOGY OFFICE NOTE   [name], [name]                      MRN:          [id number]  DATE:01/14/2007                            DOB:          1927-09-12    CHIEF COMPLAINT:  Followup of colonoscopy.   Ian Sullivan had an incomplete colonoscopy in March of 2008 and again when  I tried in September.  He went on to have a CT colonoscopy which did not  show any polyps but suggested a rectal lipoma. We sat and talked about  his previous problems with small bowel obstruction.  He is asking for  some Promethazine to have on a p.r.n. basis as he thinks that when he  has these symptoms, some of which may be gas bloating at the  fundoplication, are relieved and hospitalization is prevented.  I  prescribed this.   PLAN:  I will see him back as needed at this time.  He does have  Barrett's esophagus, he is on a protocol for that. I would not attempt a  colonoscopy any time soon, 5 years at the earliest on a routine basis.  Further plans pending that.  He has had his modified barium swallow and  is following those treatment recommendations.     Iva Boop, MD,FACG  Electronically Signed    CEG/MedQ  DD: 01/14/2007  DT: 01/15/2007  Job #: [id number]   cc:   Ralene Ok, M.D.

## 2010-08-13 NOTE — Assessment & Plan Note (Signed)
[facility]                         GASTROENTEROLOGY OFFICE NOTE   LEAMAN, ABE                      MRN:          [id number]  DATE:12/01/2006                            DOB:          May 27, 1927    CHIEF COMPLAINT:  Followup of incomplete colonoscopy, dysphagia,  Barrett's esophagus.   PROBLEM LIST:  1. Barrett's esophagus.  Last endoscopy without dysplasia in 2007.  2. Incomplete colonoscopy in March 2008, for followup of adenomatous      polyps.  He has a redundant colon with diverticulosis.  3. Status post Nissen fundoplication which apparently has slipped.  4. History of small bowel obstructive x4 with the last being several      years ago at an outside hospital.  5. Gas bloating symptoms after Nissen fundoplication.  6. Anxiety and depression.  7. Irritable bowel syndrome.  8. Recent problems with oropharyngeal type dysphasia.  9. Barium swallow coordinated by Dr. Lazarus Salines demonstrating some      tertiary contractions with good peristalsis, a small, distal      esophageal diverticulum and a small hiatal hernia, otherwise      normal.  10.Dyslipidemia.  11.Cervical spine problems followed by Dr. Otelia Sergeant.  12.Palpitations.  13.Negative stress Cardiolite in 2004.   INTERVAL HISTORY:  He is having trouble swallowing pills and when he  drinks liquids or saliva, sometimes he feels like he is aspirating it a  little bit and chokes.  Concerned about the incomplete colonoscopy prep,  although the cecum was reached.  No bleeding problems.  He is having no  heartburn problems.  He is on Carafate which does not control his  symptoms better than Prilosec 20 mg daily (complete control).  This was  prescribed for bile gastritis.  Medications are listed and reviewed in  the chart.   ALLERGIES:  HORSE SERUM.   PHYSICAL EXAMINATION:  VITAL SIGNS:  Weight 176 pounds, pulse 82, blood  pressure 110/70.   ASSESSMENT:  1. History of colon polyps with  incomplete colonoscopy.  2. Gastroesophageal reflux disease with Barrett's esophagus.  3. Oropharyngeal dysphagia symptoms.   PLAN:  1. Modified barium swallow.  2. He can discontinue the Carafate and see how he does without that.  3. Followup endoscopy for Barrett's esophagus either in 2009, or 2010.      He has had several normal, i.e. node dysplasia type biopsies so I      think he could go 3 years from this last one unless there are      problems.  4. Go ahead and reschedule colonoscopy with MiraLax daily for a week      before the prep and then use a 4 NuLytely or Colyte type prep.      That has worked in the past.  5. Further plans pending clinical course.     Iva Boop, MD,FACG  Electronically Signed    CEG/MedQ  DD: 12/01/2006  DT: 12/01/2006  Job #: [id number]   cc:   Gloris Manchester. Lazarus Salines, M.D.  Ralene Ok, M.D.

## 2010-08-13 NOTE — Op Note (Signed)
NAME:  Ian Sullivan, Ian Sullivan NO.:  [id number]   MEDICAL RECORD NO.:  [id number]          PATIENT TYPE:  INP   LOCATION:  5017                         FACILITY:  [facility]   PHYSICIAN:  Kerrin Champagne, M.D.   DATE OF BIRTH:  25-Dec-1927   DATE OF PROCEDURE:  12/30/2007  DATE OF DISCHARGE:                               OPERATIVE REPORT   PREOPERATIVE DIAGNOSES:  1. Lumbar spinal stenosis L2-L3, L3-L4, and L4-L5.  2. L4-L5 degenerative spondylolisthesis dynamic grade 1.   POSTOPERATIVE DIAGNOSIS:  1. Lumbar spinal stenosis L2-L3, L3-L4, and L4-L5.  2. L4-L5 degenerative spondylolisthesis dynamic grade 1.   PROCEDURE:  Central decompressive laminectomy L2-L3, L3-L4, and L4-L5,  right L4-L5 transforaminal lumbar interbody fusion with 12-mm DePuy  Concorde lordotic cage and local bone graft.  L4-L5 internal fixation  using a pedicle screws and rods, DePuy Monarch System.  Posterolateral  fusion at L4-L5 with local bone graft.   SURGEON:  Kerrin Champagne, MD.   ASSISTANT:  [name], PA-C.   ANESTHESIA:  General via orotracheal intubation, Dr. Laverle Hobby and  Dr. Jacklynn Bue.   FINDINGS:  Severe lumbar spinal stenosis L4-L5 centrally and biforaminal  with moderate lumbar spinal stenosis L2-L3 and L3-L4.  Spondylolisthesis  L4-L5.   SPECIMEN:  None.   ESTIMATED BLOOD LOSS:  400 mL, 120 mL of Hyperhematocrit Cell- Saver  blood returned.   COMPLICATIONS:  None.   Neuromonitoring remained normal throughout the case, left L4 screws at  16, both L5 screws greater than 40, and the right L4 screw at 33 mm ohms  resistance.   DRAINS:  Foley to straight drain, right.  Hemovac x1.   COMPLICATIONS:  None.   BRIEF CLINICAL HISTORY:  The patient is a 77 year old male with a  history of mitral valve prolapse, atrial fibrillation, and hypertension.  He has been followed conservatively for lumbar spinal stenosis, severe  at the L4-L5 level with degenerative  spondylolisthesis, moderate  stenosis at L2-L3 and L3-L4.  He has been experiencing severe worsening  neurogenic claudication over the last 2 years.  His wife has been  significantly ill requiring him to avoid intervention for some time.  He  is presenting now to undergo decompression and fusion for severe lumbar  spinal stenosis with fusion at the L4-L5 level for degenerative  spondylolisthesis with associated severe stenosis in this segment.  The  intraoperative findings are as above.   DESCRIPTION OF PROCEDURE:  After adequate general anesthesia, the  patient had Foley catheter placed and neuromonitoring leads placed.  He  was then placed into a prone position.  All pressure points were well  padded.  Jackson spinal table was used.  Standard preoperative  antibiotics of Ancef.  All pressure points were again well padded and  examined.  The standard prep with DuraPrep solution in the lower dorsal  spine to the mid sacral level.  Draped in the usual manner, iodine Vi-  Drape was used.  Incision ellipsing the upper part of an old incisions  scar extending from L1 to S1.  Through the skin and subcutaneous layers  down to the patient's spinous processes of L2, L3, L4, L5 and  S1.  Cobbs were used to elevate the paralumbar muscles bilaterally incising  the attachments of the paralumbar muscles off the lateral aspects of the  spinous processes from L2 to S1.  First, cerebellar retractors were  placed and then eventually a Viper retractor was placed.   Carefully bleeders were controlled using both monopolar and bipolar  electrocautery.  Intraoperative neuromonitoring was carried out through  the case.  Remained normal throughout the case.   A Cell Saver was used throughout the case.   A loupe magnification and a head lamp were used for the decompression  portion of the procedure and for the infusion portion as well C-arm  fluoro was used.  The patient's intraoperative C-arm was then used  to  confirm clamps placed over the spinous processes of both L4 and L5.  These were marked then with [facility] rongeur and marking pen.  After  exposure was obtained preserving the facet capsules at L2-L3, L3-L4 and  then resecting at the L4-L5 level.  The spinous process of L3 and L4  were resected and the lower 60% of the spinous process of L2 was  resected down to the base of spinous process and lamina at the L2 level.  A Leksell rongeur was used to carefully remove the bone from inferior  aspect of the lamina of L2 bilaterally.  The spinous process of L3 and  L4 and central portions were then carefully thinned using a Leksell  rongeur.  All of this bone was then kept preserved and then denuded of  any soft tissue for bone grafting purposes later.  Osteotomes were then  used to perform medial facetectomies at L4-L5 and L3-L4 and a slight  amount at L2-L3.  A 3-mm Kerrison was then used to remove central  portions of the lamina from L4-L5 to L3-L4 and L2-L3 superiorly.  Then  continuing distally, the central portions of the laminectomy were then  carefully opened bilaterally using osteotomes with a 3 and 4-mm  Kerrisons removing medial aspects of the facet over 10% L2-L3 level and  L3-L4 level and L4-L5.  The L4-L5 generous resection of the medial facet  were carried out using osteotomes as well as 2-mm and 3-mm Kerrisons,  removing ligamentum flavum, reflected portions of ligamentum flavum  extending into the neuroforamen, compressing bilateral L4 nerve roots.  Foraminotomies were performed over both L5 nerve roots, which showed  severe nerve compression with lateral recesses at L4-L5 and within the  entry point into the neuroforamen in the L5 bilaterally.  Osteotomes  were used to resect superior articular process of L5 bilaterally  decompressing the lateral recess as well as the neuroforamen of both  sides.  Preserving about 40% of the left facet joint for bone grafting  purposes.    The exposure was continued out to the transverse process of L4 and L5.  L4 at L3-L4 level and L5 at the L4-L5 level bilaterally,  these areas  were packed.  Bone wax was then applied to the bleeding cancellous bone  surfaces on each side.  Using the loupe magnification, foraminotomies  were then carried out at the right L3 and L4 nerve roots resecting the  entire facet on the right side at L4-L5 and identifying the pedicle of  L5.  Decompressing the L4 nerve roots here as well as on the left side,  such that a hockey stick nerve probe could be easily passed out  the  neuroforamen.  Severe nerve compression was found to be present at L4-L5  and this area of ligamentum flavum was resected by lifting away from the  thecal sac and then debriding using 2 or 3-mm Kerrisons.  The  foraminotomy performed over the L5 nerve root on both sides and  resections from the medial superior articular process was carried down.  Decompression of four nerve roots was done without difficulty and  carried out in the neuroforamen of both sides.  Epidural veins were then  cauterized over the right side and retracting thecal sac over the right  side and identifying the L4-L5 disk space.  A 15-blade scalpel was then  used to incise the disk.  Pituitary was used to remove the disk  material.  Intraoperative C-arm fluoro was then brought into the field  following the decompression of L2-L3, L3-L4, and L4-L5 and under C-arm  fluoro, then an awl was then placed into the lateral aspect of the left  side at L4 and resection of the superior articular process of L4 with  the pedicle and the transverse process observed on C-arm fluoro to be in  the correct position and alignment.  The awl was entered here and then a  handheld pedicle finder was used to probe the pedicle to a depth of 45  mm.  This was then tested using a ball-tip probe and to ensure patency  and no broaching in cortex.  Tapping then performed using a 6.25 tap  and  a 7.0 x 45-mm screw was placed at this side after first decorticating  the transverse process with lying local bone graft harvested from the  central laminectomy.  I zeroed down the C-arm to be in good position  alignment.  The awl placed at the L5 pedicle at the intersection of the  superior articular process of L5 with the lateral aspect at the pedicle  of L5 and the transverse process here.  I only used to make an initial  entry point and a handheld pedicle finder was then used to probe the  pedicle and ball-tip probe was used to ensure patency of the hole.  There was no evidence of broaching and cortex within the pedicle hole  performed.  Tapping with a 6.25 tap and then a 45-mm x 7.0 screw was  placed after decorticating the transverse process and applying further  bone graft here.  Each of the fasteners to the screws were then loosened  appropriately to accept the rod.   Attention was then turned to the right side where similarly, then the  awl was used to make an entry point at the lateral aspect of the pedicle  intersection of the superior articular process of L4 with the transverse  process and then a pedicle finder used to probe the pedicle using C-arm  fluoro to ascertain correct position alignment.  This was probed to 45  mm and a 7.0 screw chosen.  Tapping with a 6.25 tap determining that the  entry point and the hole through pedicle was patent without sign of  broaching and cortex using a ball-tip probe.  Decorticating in  transverse process, another 45-mm x 7.0 pedicle screw was placed on the  right side of L4.  Attention was then turned to the right side of L5  pedicle where again an awl was used to make an entry point into the  lateral aspect of the pedicle at the intersection over the transverse  process.  We zeroed down  the C-arm fluoro to be in good position  alignment and using a handheld pedicle finder probing the pedicle 45-mm,  tapping with a 6.25 tap,  observing with ball-tip probe patency of the  opening into the pedicle and ensuring no broaching of the cortex here.  The final 45-mm x 7.0 screw was then placed on the right side of the L5  nerve root both of these obtaining excellent purchase.  With this then C-  arm fluoro was used to carefully zeroed to screw position and alignment  appeared to be quite good on lateral view.  Intraoperative dural  monitoring and testing the soft resistance 16 ohms at left L4, left L5  tested at greater than 40, right L4 at 33, and the right L5 greater than  40.  This completed the pedicle screw insertion.  A 40-mm rods were then  placed into the loosened fasteners to allow for the rod placements.  These were then placed and caps placed appropriately.  Attention was  then turned to the TLIF from the right side using headlamp and loop  magnification with careful retraction of the thecal sac.  The disk space  was then dilated using first 7 and 8 and 9 and 10 and a 11-mm dilators  and a finally a 12-mm dilator.  Disk space debrided of degenerative disk  material using pituitary rongeurs straight upbiting and downbiting.  Using first the small and then the large pituitaries provided.  Once  this was debrided and curettage was carried out at the end plates using  a straight upbiting right and upbiting left curettes and then the ring  curettes and debriding the disk space and end plate material down to  bleeding end plate bone.  This was completed and a trial was performed  using trial inserts 9, 10, 11, and 12-mm provided excellent fit and  appeared to fit the disk space quite nicely.  A 12-mm lordotic Concorde  cage was chosen and this was brought on to the field, packed with  morselized local bone graft.  Additional local bone graft was then used  to pack the intervertebral disk space on the right side.  Bleeders  controlled using bipolar electrocautery.  With this completed then, and  the disk space  packed, and space was made for the cage using a 10-mm  trial insert.  This was then packed into placed into the disk space,  packing the bone graft further within the disk space.  The 12-mm cage  was then impacted into place at approximately 35 degrees of convergence.  Additional impaction to provide some subset below the posterior aspect  of the disk space by about 2-3 mm.  This was observed on C-arm fluoro to  be in excellent position alignment.  Care was taken to ensure there was  no bone graft remaining within the spinal canal that could be  retropulsed with collapse of disk space.  Irrigation was then carried  out.  With this then the fasteners were then attached to the rods using  a torque wrench and the caps were then tightened to 80 foot pounds on  the right side at L4 and compression obtained between the fastener area  at the L4 and L5 and the cap at the L5 was then tightened to 80 foot  pounds.   Attention was then turned to left side where the cap was then attached  to the rod to 80 foot pounds and then compression obtained between the  fasteners  at L4 and L5 and cap then tightened to 80 foot pounds on the  left side at the L5 level.  Intraoperative C-arm fluoro was then used to  perform a AP and lateral views demonstrating excellent position  alignment of screws and rods.  The grafts and cage within the center  portions of the disk compared to be in excellent position alignment.  Irrigation was then carried out.  Careful inspection of the neuroforamen  and left side L3 required further resection where we reflected  ligamentum flavum and then this probed quite nicely demonstrating the L3  nerve roots free as well towards the right side totally free.  Both L4  nerve roots exiting without neuroforaminal compression at this point and  both L5 nerve roots appeared to be exiting without compromise.  Gelfoam  and thrombin soaked placed into the lateral recess on the right side of   the L4-L5 level with the posterior aspect at the TLIF area and  foraminotomy.  Additional residual bone graft was then placed on the  left side out posterolaterally within the joint on the left that had  been decorticated using a high-speed burr.  With this completed,  irrigation was carried out.  The soft tissues on either side were then carefully debrided using Mayo scissors.  The medium Hemovac drain placed  at the depth of the incision exiting over the right lower lumbar spine.  The lumbar dorsal muscles were reapproximated in the midline loosely  with interrupted #1 Vicryl sutures.  Lumbodorsal fascia reattached to  the L2 and L1 spinous processes using #1 Vicryl sutures inferiorly.  The  lumbar dorsal fascia was reattached to itself with interrupted #1 Vicryl  sutures.  The remaining lumbar dorsal fascia reattached with interrupted  #1 Vicryl sutures to itself.  Deep subcu layers approximated with  interrupted with 0 Vicryl suture, the more superficial layers with  interrupted 2-0, and then the skin closed with a running subcu stitch of  4-0 Vicryl.  Dermabond was then applied, 4 x 4, ABD pads affixed to the  skin with Hypafix tape.  The patient was then returned to the supine  position, reactivated, extubated, and returned to the recovery room in  satisfactory condition.  All instruments and sponge counts were correct.  Note, that standard preoperative protocol marking and the incision and  the expected right side for TLIF placement at L4-L5 was performed and  levels marked.  Intraoperatively time-out was carried out, the patient  identified, as well as operating surgeon's assistants within the room.  Then termination of the levels to be carried out central laminectomy in  patient with L4-L5 level for spondylolisthesis.      Kerrin Champagne, M.D.  Electronically Signed     JEN/MEDQ  D:  12/30/2007  T:  12/31/2007  Job:  [id number]

## 2010-08-13 NOTE — Assessment & Plan Note (Signed)
[facility]                            CARDIOLOGY OFFICE NOTE   SHJON, LIZARRAGA                      MRN:          [id number]  DATE:04/05/2008                            DOB:          January 31, 1928    Mr. Clos is in for followup.  In general, he is really stable from a  cardiac standpoint.  The biggest problem has been that he has had  recurrent pain and an epidural has not really helped much.  He now has a  problem at L2 on the left, and additional surgical procedure has thought  to be likely.  He got through the last one without too much difficulty  and the presumption is that this would be somewhat easier.   MEDICATIONS:  1. Atenolol 50 mg daily.  2. Coumadin as directed.  3. Vitamin C.  4. Vitamin D.  5. Pravastatin 10 mg daily.  6. Flaxseed oil.  7. Vitamin B12.  8. Vitamin E.  9. Prilosec 20 mg daily.  10.Lotrel 5/20 daily.   PHYSICAL EXAMINATION:  VITAL SIGNS:  Blood pressure is 122/74, the pulse  is 91.  LUNGS:  The lung fields are clear.  CARDIAC:  Rhythm is irregularly irregular.  There is no definite murmur  noted.   Overall, this gentleman has been stable from a cardiac standpoint.  His  biggest issue is atrial fibrillation with controlled ventricular  response.  We will be happy to follow him in the hospital if he has a  repeat surgical procedure.     Arturo Morton. Riley Kill, MD, [facility]  Electronically Signed    TDS/MedQ  DD: 04/05/2008  DT: 04/06/2008  Job #: [id number]

## 2010-08-13 NOTE — Assessment & Plan Note (Signed)
[facility]                            CARDIOLOGY OFFICE NOTE   TERRI, MALERBA                      MRN:          [id number]  DATE:08/12/2007                            DOB:          08/30/1927    [name] walks in and is stable.  The nurses noted on EKG obtained that  he appears to be in atrial fibrillation.  He denies any active symptoms.  Overall he feels well.  He recently underwent an echocardiogram 2 days  ago.  The study revealed preserved overall left ventricular ejection  fraction was 55-60%, mild increase in aortic valve thickness with mild  aortic regurgitation.  There was moderate MR with MR grade of 2+ on a  scale of 4+. The left atrium and right atrium were both mildly to  moderately dilated.  He actually feels well and was not aware that he  was in atrial fibrillation.   CURRENT MEDICATIONS:  1. Aspirin 81 mg daily.  2. Lotrel 5/20 daily.  3. Atenolol 25 mg daily.  4. Tramadol 50 mg three to four times daily.  5. Prilosec 20 mg daily.  6. Vitamin E 400 international units daily.  7. Multivitamin one-half daily.  8. Vitamin C 500 mg daily.  9. B12 tablet daily.  10.Flaxseed oil two daily.  11.Pravastatin 10 mg daily.  12.Vitamin D 400 international units three times a week.   PHYSICAL EXAMINATION:  GENERAL:  He is alert and oriented.  He is not  aware that he is in atrial fibrillation.  VITAL SIGNS:  The blood pressure is 124/74, and the pulse is 80.  It is  irregularly irregular.  LUNGS:  The lung fields are clear.  CARDIAC:  I would do not appreciate a significant mitral regurgitation  murmur.  EXTREMITIES:  The extremities do not reveal significant edema.   His electrocardiogram demonstrates atrial fibrillation.  There is a  right bundle branch block.  There are minor ST-T abnormalities, but no  definite significant ST abnormalities.  When compared to the previous  tracing, the presence of atrial fibrillation is  clearly new.   IMPRESSION:  1. New onset atrial fibrillation with moderate mitral regurgitation      noted by echocardiography and dilated left atrium.  2. Advanced age.  3. Long history of tachy palpitations.  4. History of hypertension.   PLAN:  Continue current medical regimen except for the addition of  Coumadin.  We will start at 2.5 mg daily.  I brought Shelby Dubin into  the room, he met her, and his wife is on warfarin, so he is familiar  with Corrie Dandy.  We plan to add 2.5 mg daily, and he is planning to come to  the [facility] on Tuesday.  Should he have problems, he will see [place]  sooner.  I will see him back in 10 days' time.     Arturo Morton. Riley Kill, MD, [facility]  Electronically Signed    TDS/MedQ  DD: 08/15/2007  DT: 08/15/2007  Job #: [id number]

## 2010-08-13 NOTE — Assessment & Plan Note (Signed)
[facility]                            CARDIOLOGY OFFICE NOTE   CHASEN, MENDELL                      MRN:          [id number]  DATE:03/03/2008                            DOB:          1928-02-04    Mr. Bottger is in for a followup visit.  To briefly summarize, he is  stable.  Unfortunately, he had to go through another myelogram and I  think we are going to have to take him back to the operating room.  Because of his advanced age, atrial fibrillation with need to come off  Coumadin, he has perhaps had some slight increased risk.  However, he  tolerated the previous surgery relatively well although he did require  transfusion.  He seems perhaps a bit weaker, but he is not having any  chest pain or any prohibitive cardiac symptoms.  There is no evidence of  heart failure at present.   On physical, the blood pressure is 126/88 and the pulse is 84  irregularly irregular.  The lung fields are clear.  The cardiac rhythm  is irregularly irregular without a definite murmur currently.   EKG reveals atrial fibrillation with controlled ventricular response,  rate 84.   IMPRESSION:  1. Atrial fibrillation with controlled ventricular response on      Coumadin anticoagulation.  2. Status post surgery for lumbar spinal stenosis L2-3, 3-4, 4-5 with      L4-5 degenerative spondylolisthesis status post repair.   PLAN:  1. I would recommend that he continue beta-blockers throughout      surgery.  2. We would favor resuming Coumadin as soon as possible after surgery      when felt appropriate by the operating surgeon.  3. We will be happy to follow the patient in the hospital at that      time.     Arturo Morton. Riley Kill, MD, [facility]  Electronically Signed    TDS/MedQ  DD: 03/03/2008  DT: 03/03/2008  Job #: [id number]   cc:   Kerrin Champagne, M.D.

## 2010-08-13 NOTE — Assessment & Plan Note (Signed)
[facility]                            CARDIOLOGY OFFICE NOTE   KUTTER, SCHNEPF                      MRN:          [id number]  DATE:11/23/2007                            DOB:          Feb 28, 1928    Ian Sullivan is in for a followup visit.  From a cardiac standpoint, he is  doing well but he has not been able to be active at all because of  continuing problems with his back.  They now have recommended that he  have surgery.  He contends that the surgery is fairly extensive, and it  appears that way with planned procedure per the Orthopedics' request  that he have central laminectomy L2-L3, L3-L4, L4-L5 with posterolateral  fusion L4-L5 with general anesthesia.  The patient has in the last bit  developed atrial fibrillation with controlled ventricular response, and  has been stable on Coumadin.  He has also had echocardiography in May of  this year.  At that time, the EF was 55-60% with very mild aortic  regurgitation and moderate mitral valve regurgitation.  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  At the  patient's last catheterization in 1992, he had no significant focal  obstructive disease.  He did undergo radionuclide imaging in 2004, and  at that time had no perfusion defect and normal wall motion, although he  did have a little bit of discomfort on the treadmill.   Overall, he appears to be stable.  I think, given his age, while this is  not a necessarily predictive factor, that there is at least some modest  increased risk.  Because he was not able to achieve 4 METs, it may be  prudent to do a Myoview, although this may be complicated by the fact  that he has atrial fibrillation.  He and I discussed this at some great  length today, there is no obvious cardiac limitation preventing surgery  at the present time.  He does recognize that it is an extensive  operation in an older gentleman, and that  there are risks involved.  He  will ponder his options, and make a decision about whether to proceed  with surgery.  I am not sure that based on the fact that we have a  recent echocardiogram that there are any other preoperative studies that  will be helpful at this point in time.     Arturo Morton. Riley Kill, MD, [facility]  Electronically Signed    TDS/MedQ  DD: 12/04/2007  DT: 12/04/2007  Job #: [id number]

## 2010-08-16 NOTE — Consult Note (Signed)
   NAME:  Ian Sullivan, Ian Sullivan NO.:  [id number]   MEDICAL RECORD NO.:  [id number]                   PATIENT TYPE:   LOCATION:                                       FACILITY:   PHYSICIAN:  Coletta Memos, MD                    DATE OF BIRTH:  20-Jul-1927   DATE OF CONSULTATION:  01/15/2002  DATE OF DISCHARGE:                                   CONSULTATION   CHIEF COMPLAINT:  Wound infection status post carpal tunnel release right  hand on January 06, 2002.   HISTORY OF PRESENT ILLNESS:  The patient is a patient of Ian Sullivan,  M.D. who had undergone a left carpal tunnel release previously.  He did  quite well afterwards with a right carpal tunnel disease and therefore  underwent a right carpal tunnel release.  Postoperatively, he was doing well  until yesterday, January 14, 2002.  He noticed increasing pain in his right  hand and purulent-looking material around the sutures.  He therefore called  me and I instructed him to come to the emergency room.   PHYSICAL EXAMINATION:  RIGHT HAND:  On examination, the patient's hand is  erythematous, it is tender.  There is some swelling along the incision line.  There is purulent-looking material around the sutures.  There is no drainage  that I expressed today but it certainly does look infected.  He has full use  of his thumb though it is painful.  He has normal strength in the hand,  again I note it is painful.   IMPRESSION:  I believe the patient has a wound infection status post carpal  tunnel release.  What I will do is to prescribe Keflex 500 mg to be taken  four times a day for the next 10 days.  He is also to come back to the  office on Tuesday so that either I or Dr. Lovell Sullivan can take a look at his  hand.  There should be significant improvement by that time if I have  selected the proper antibiotic.  The patient understands.  I did not remove  the sutures today as I was quite fearful that the wound  would simply open up  and would prolong healing.  If, however, there has been no change by Tuesday  that may be one thing that has to be done.                                               Coletta Memos, MD    KC/MEDQ  D:  01/15/2002  T:  01/16/2002  Job:  [id number]

## 2010-08-16 NOTE — Letter (Signed)
February 24, 2006    Wilson Singer, M.D.  [address] [place], [place] [address]   RE:  Ian Sullivan, DEVAN  MRN:  [id number]  /  DOB:  November 18, 1927   Dear Dr. Karilyn Cota:   I had the pleasure of seeing your nice patient, Ian Sullivan, today  in the office in followup.  He has an occasional mild cough and minimal  if any shortness of breath.  He rarely has episodes of weakness, but  during these has actually checked his pulse and it has been  satisfactory.  His blood pressures tend to remain mildly elevated.  He  did mention that you had increased his atenolol, but the patient held  off on doing this at the present time.  He is scheduled to see you early  next month.   PHYSICAL EXAMINATION:  The blood pressure is 142/66, the pulse is 53.  LUNGS:  The lung fields are clear.  CARDIAC:  Reveals a normal first and second heart sound.  I cannot  appreciate a significant tricuspid regurgitation murmur.  There is a  marked pectus deformity.  EXTREMITIES:  Reveal no edema.   The electrocardiogram demonstrates normal sinus rhythm with right bundle  branch block.   The patient's most recent echocardiogram was done in May of this year.  His overall left ventricular function was normal with an overall  systolic ejection fraction of 28%.  There was perhaps mild left  ventricular dilatation.  There was also mild RV dilatation.  Doppler  velocities suggested minimal increase in pulmonary artery systolic  pressure.  These are not significantly changed from what has been  previously appreciated.   Overall, Ian Sullivan continues to get along well.  I am not exactly sure  what the episodes of weakness are related to.  Importantly, he has taken  his pulse and it has been satisfactory, and also he has checked his  sugar because his wife is a diabetic and these have been okay as well.  They do not appear to be exertion-related nor at any specific time of  the day, or related to any  specific activity or eating.  We have asked  him to keep an eye on this, as it is a very rare event.  With underlying  right bundle branch block there potentially could be some conduction  system disease, but there is not high-grade evidence of this at the  present time.  He also asked me about his atenolol.  With his resting  heart rate of 53, I probably would defer doing this.  He does want to  get his blood pressure down somewhat, and he might be a candidate for  diuretics, in low dose, but I will defer this to your recommendation.  I  probably would avoid increasing his atenolol, given his baseline heart  rate of 53.  Thanks for allowing me to share in his care.  Best regards.    Sincerely,      Arturo Morton. Riley Kill, MD, [facility]  Electronically Signed    TDS/MedQ  DD: 02/24/2006  DT: 02/24/2006  Job #: [id number]

## 2010-08-16 NOTE — Op Note (Signed)
NAME:  Ian Sullivan, Ian Sullivan                         ACCOUNT NO.:  [id number]   MEDICAL RECORD NO.:  [id number]                   PATIENT TYPE:  AMB   LOCATION:  DSC                                  FACILITY:  [facility]   PHYSICIAN:  Cindee Salt, M.D.                    DATE OF BIRTH:  1927-11-16   DATE OF PROCEDURE:  04/20/2003  DATE OF DISCHARGE:                                 OPERATIVE REPORT   PREOPERATIVE DIAGNOSIS:  Stenosing tenosynovitis right index, middle, ring,  and little fingers.   POSTOPERATIVE DIAGNOSIS:  Stenosing tenosynovitis right index, middle, ring,  and little fingers.   OPERATION:  Release A1 pulleys right index, middle, ring, and little  fingers.   SURGEON:  Cindee Salt, M.D.   ASSISTANTCarolyne Fiscal   ANESTHESIA:  Forearm base, IV regional.   HISTORY:  The patient is a 77 year old male with a history of stenosing  tenosynovitis of the index, middle, ring, and little fingers of his right  hand that has obviously not responded to conservative treatment in that  multiple digits rarely will respond.  He is admitted now for release of  each.   DESCRIPTION OF PROCEDURE:  The patient was brought to the operating room  where a forearm based IV regional anesthetic was carried out without  difficulty.  He was prepped using DuraPrep in a supine position, right arm  free.  An oblique incision was made over each A1 pulley separately, carried  down through the subcutaneous tissue.  Neurovascular structures were  identified and protected.  Significant scarring was present about each of  the A1 pulleys.  These were each released on the radial aspect.  A small  incision was made in the central aspect of the A2 pulley.  The fingers were  placed through a full range of motion.  No further triggering was  identified.  The tenosynovial tissue on the little finger was markedly  increased in size.  A partial synovectomy was performed.  The wounds were  irrigated.  The skin was closed with  interrupted 5-0 nylon sutures.  A  sterile compressive dressing was applied.  The patient tolerated the  procedure well and was taken to the recovery room for observation in  satisfactory condition.  He is discharged home to return to the [facility]  in [place] in one week on Vicodin and Keflex.                                               Cindee Salt, M.D.    Angelique Blonder  D:  04/20/2003  T:  04/20/2003  Job:  [id number]

## 2010-08-16 NOTE — Op Note (Signed)
   NAME:  Ian Sullivan, GOLDMAN NO.:  [id number]   MEDICAL RECORD NO.:  [id number]                   PATIENT TYPE:  OIB   LOCATION:  2899                                 FACILITY:  [facility]   PHYSICIAN:  [name], M.D.            DATE OF BIRTH:  1928-02-12   DATE OF PROCEDURE:  01/06/2002  DATE OF DISCHARGE:  01/06/2002                                 OPERATIVE REPORT   PREOPERATIVE DIAGNOSIS:  Right carpal tunnel syndrome.   POSTOPERATIVE DIAGNOSIS:  Right carpal tunnel syndrome.   PROCEDURE:  Right median nerve neurolysis at the wrist (carpal tunnel  release).   SURGEON:  [name], M.D.   ANESTHESIA:  Local.   ESTIMATED BLOOD LOSS:  Minimal.   SPECIMENS:  None.   DRAINS:  None.   COMPLICATIONS:  None.   BRIEF HISTORY:  The patient is a 77 year old white male who has suffered  from bilateral carpal tunnel syndrome.  He had a let carpal tunnel release  several months ago and did well.  He requested a right carpal tunnel  release.   DESCRIPTION OF PROCEDURE:  The patient was brought to the operating room by  the nursing team.  He remained in a supine position.  His right upper  extremity was then prepared with Betadine scrub and Betadine solution.  Sterile drapes were applied.  I then injected the area to be incised with  Marcaine solution and used the 15 blade scalpel to make an incision along  the patient's palmar crease.  I dissected through the subcutaneous tissue.  I grasped the superficial fascia, divided the fascia, and then dissected  down through the transverse carpal ligament.  I incised the ligament with a  15 blade scalpel and then extended the ligation of the ligament both  proximally and distally using the Metzenbaum scissors.  I got good  decompression all along the length of the median nerve.  I gained good  decompression both proximally and distally, and then I achieved hemostasis  using electrocautery and then  reapproximated the patient's skin and  subcutaneous tissue with a running 3-0 nylon suture.  The wound was then  coated with bacitracin ointment, a sterile dressing was applied, the drapes  were removed.  The patient was subsequently transported out of the operating  room in stable condition.  All sponge, instrument, and needle counts were  correct at the end of this case.                                               [name], M.D.    JDJ/MEDQ  D:  01/06/2002  T:  01/07/2002  Job:  [id number]

## 2010-08-16 NOTE — Discharge Summary (Signed)
NAME:  Ian Sullivan, Ian Sullivan NO.:  [id number]   MEDICAL RECORD NO.:  [id number]          PATIENT TYPE:  INP   LOCATION:  5017                         FACILITY:  [facility]   PHYSICIAN:  Kerrin Champagne, M.D.   DATE OF BIRTH:  [birth year]   DATE OF ADMISSION:  12/30/2007  DATE OF DISCHARGE:  01/03/2008                               DISCHARGE SUMMARY   ADMISSION DIAGNOSES:  1. Lumbar spinal stenosis L2-3, L3-4, and L4-5.  2. L4-5 degenerative spondylolisthesis dynamic grade 1.  3. Atrial fibrillation.  4. Barrett's esophagitis.  5. Hypertension.  6. Sleep apnea.   DISCHARGE DIAGNOSES:  1. Lumbar spinal stenosis L2-3, L3-4, and L4-5.  2. L4-5 degenerative spondylolisthesis dynamic grade 1.  3. Atrial fibrillation.  4. Barrett's esophagitis.  5. Hypertension.  6. Sleep apnea.  7. Posthemorrhagic anemia requiring blood transfusion.   PROCEDURE:  On December 30, 2007, the patient underwent central  decompressive laminectomy L2-3, L3-4, and L4-5 with right L4-5  transforaminal lumbar interbody fusion with pedicle screw and rod  fixation for posterolateral fusion L4-L5 with local bone graft and  interbody cage.  This was performed by Dr. Otelia Sergeant, assisted by Maud Deed, PA-C, under general anesthesia.   CONSULTATIONS:  None.   BRIEF HISTORY:  The patient is a 77 year old male followed  conservatively for a number of years with severe spinal stenosis as well  as degenerative spondylolisthesis of L4-5.  He has had increasing  symptoms of neurogenic claudication.  He was unable to get relief of his  symptoms with conservative treatment including epidural steroid  injections.  It was felt he would benefit from surgical intervention and  was admitted for the procedure as stated above.  The patient was seen  preoperatively by Dr. Riley Kill, his cardiologist who felt the patient was  stable for surgical intervention.  He underwent 2D echo prior to surgery  for evaluation and was  deemed stable and at low risk for the procedure.   BRIEF HOSPITAL COURSE:  The patient tolerated the procedure under  general anesthesia without complications.  Postoperatively,  neurovascular motor function of the lower extremities was noted to be  intact.  Hemovac drain was discontinued on the first postoperative day  and daily dressing changes were done thereafter showing wound healing  without signs of infection.  Postoperatively, he did complain of some  numbness of the left anterolateral thigh.  This was felt to be possibly  related to positioning and did improve during the hospital stay.  Postoperatively, hemoglobin and hematocrit decreased significantly.  He  did require transfusion with 2 units of packed red blood cells.  Hemoglobin/hematocrit 8.6 and 25.6, pretransfusion and posttransfusion  values 10.6 and 31.3 respectively.  The patient's Coumadin was restarted  for his atrial fib.  Adjustments in dose made according to protimes by  the pharmacy.  The patient was fitted with an Aspen LSO by [employer] and  utilized this during physical therapy for ambulation and gait training.  Durable medical equipment was made available to the patient.  Initially,  his pain was controlled with PCA analgesics and he was  weaned to p.o.  analgesics without difficulty.  The patient was ambulating as much as  150 feet prior to discharge.  He was afebrile.  Vital signs were stable  at discharge.  The patient was able to take a regular diet.  He was  voiding independently once his Foley catheter was discontinued.  He was  discharged to his home in stable condition on January 03, 2008.   PERTINENT LABORATORY VALUES:  EKG on admission:  Atrial fib, nonspecific  intraventricular conduction block, atrial fib new since last tracing  December 10, 2006.  Hemoglobin and hematocrit as stated above.  INR on  admission was 1.4 and at discharge 1.9.  Chemistry studies on admission  and throughout the hospital  stay were within normal limits with the  exception of calcium 7.7, total protein 5.9, and AST 38.  Urinalysis on  admission negative for urinary tract infection.   PLAN:  The patient was discharged to his home.  He will continue  ambulating utilizing a walker and will increase his activity slowly.  He  will avoid bending, lifting, or twisting.  He is instructed to wear his  Aspen LSO at all times when out of bed.  He will be allowed to shower at  home.  He will change his dressing daily or as needed.  The patient will  follow up with Dr. Otelia Sergeant 2 weeks from surgery and will call to make this  appointment.  He will resume a low-sodium heart healthy diet.  He was  instructed in continuing medications as taken prior to admission and  medication reconciliation form was given with these instructions.  He  was given prescriptions for Vicodin one to two every 4-6 hours as needed  for pain, Robaxin 500 mg one every 8 hours as needed for spasm,  Trinsicon one p.o. daily, and Colace one p.o. b.i.d.  He will call the  office if he has questions or concerns prior to his return office visit.  All questions encouraged and answered.   CONDITION ON DISCHARGE:  Stable.      [name], P.A.      Kerrin Champagne, M.D.  Electronically Signed    SMV/MEDQ  D:  02/03/2008  T:  02/04/2008  Job:  [id number]

## 2010-08-16 NOTE — Assessment & Plan Note (Signed)
[facility]                         GASTROENTEROLOGY OFFICE NOTE   Ian Sullivan, Ian Sullivan                      MRN:          [id number]  DATE:07/08/2006                            DOB:          09/03/1927    Ian Sullivan comes in to talk about his colonoscopy, which was not adequate.  He  has a very long redundant colon that I think always seems to cause him  trouble in getting his prep.  He does not like taking the big bottle.  He says when he does take the gallon, it has always been helpful, but  this time he did not do it this way, and the prep was only fair.  In any  case, I did indicate to him that I did see the whole colon, it just was  not a good prep all the way around.  He does have Barrett's esophagus,  and has diverticular disease.  We have taken care of him over the years  for these.  He has had a fundoplication, which had some unraveling, and  resulting incompetence of his esophagus resulting in more reflux.  He  has Barrett's, which we follow endoscopically __________ biopsies.  He  is very anxious about his overall health, and takes relatively good care  of himself.  I have suggested that he follow up in approximately a year  with Dr. Leone Payor, and that his colonoscopic examination should be  repeated some time in the next 3 to 5 years.  I think he was anxious to  do it sooner, because he wants to be sure that there is no significant  residual problems.  I will leave this up to Dr. Leone Payor.     Ian Mort, MD  Electronically Signed    SML/MedQ  DD: 07/08/2006  DT: 07/08/2006  Job #: [id number]

## 2010-08-16 NOTE — Op Note (Signed)
NAME:  BASEM, YANNUZZI                         ACCOUNT NO.:  [id number]   MEDICAL RECORD NO.:  [id number]                   PATIENT TYPE:  OUT   LOCATION:  CATS                                 FACILITY:  [facility]   PHYSICIAN:  Cindee Salt, M.D.                    DATE OF BIRTH:  [birth year]   DATE OF PROCEDURE:  06/21/2003  DATE OF DISCHARGE:                                 OPERATIVE REPORT   PREOPERATIVE DIAGNOSIS:  Stenosing tenosynovitis, left middle, left ring and  left little fingers.   POSTOPERATIVE DIAGNOSIS:  Stenosing tenosynovitis, left middle, left ring  and left little fingers.   OPERATION PERFORMED:  Release A-1 pulleys, left middle, ring and little  fingers.   SURGEON:  Cindee Salt, M.D.   ASSISTANTCarolyne Fiscal.   ANESTHESIA:  Forearm based IV regional.   INDICATIONS FOR PROCEDURE:  The patient is a 77 year old male with a history  of triggering of multiple digits, both hands.  He has undergone release on  his right.  He is admitted now for release of the middle, ring and little of  his left.   DESCRIPTION OF PROCEDURE:  The patient was brought to the operating room  where forearm based IV regional anesthetic was carried out without  difficulty.  He was prepped using DuraPrep in supine position, left arm  free.  An oblique incision was made over the A-1 pulley of each of the  fingers separately, carried down through subcutaneous tissue.  The little  finger was approached first.  Neurovascular bundles identified and radial  aspect of the A-1 pulley was released.  A small incision was made centrally  in the A-2 pulley and similarly the A-1 pulley of the ring was released on  its radial aspect and on the middle, the A-1 pulley was released on its  radial aspect.  Central incisions were made in A-2.  The fingers were able  to be placed through a full range of motion without any triggering.  The  wound was irrigated on each finger and each incision was closed with  interrupted 5-0 nylon sutures.  Sterile compressive dressing was applied.  The patient tolerated the procedure well and was taken to the recovery room  for observation in satisfactory condition.  He is discharged to home to  return to the [facility] in one week on Vicodin and Keflex.                                               Cindee Salt, M.D.    Angelique Blonder  D:  06/21/2003  T:  06/22/2003  Job:  [id number]

## 2010-08-18 ENCOUNTER — Other Ambulatory Visit: Payer: Self-pay | Admitting: Cardiology

## 2010-08-19 ENCOUNTER — Other Ambulatory Visit: Payer: Self-pay | Admitting: *Deleted

## 2010-08-19 MED ORDER — POTASSIUM CHLORIDE 10 MEQ PO TBCR: 10.0000 meq | EXTENDED_RELEASE_TABLET | Freq: Two times a day (BID) | ORAL | Status: DC

## 2010-08-23 ENCOUNTER — Ambulatory Visit (INDEPENDENT_AMBULATORY_CARE_PROVIDER_SITE_OTHER): Payer: Medicare Other | Admitting: *Deleted

## 2010-08-23 DIAGNOSIS — I4891 Unspecified atrial fibrillation: Secondary | ICD-10-CM

## 2010-08-23 LAB — POCT INR: INR: 2.6

## 2010-08-27 ENCOUNTER — Encounter: Payer: Self-pay | Admitting: Cardiology

## 2010-09-02 ENCOUNTER — Other Ambulatory Visit: Payer: Self-pay | Admitting: Dermatology

## 2010-09-02 DIAGNOSIS — N63 Unspecified lump in unspecified breast: Secondary | ICD-10-CM

## 2010-09-06 ENCOUNTER — Ambulatory Visit
Admission: RE | Admit: 2010-09-06 | Discharge: 2010-09-06 | Disposition: A | Discharge status: Home / Self Care | Payer: Medicare Other | Source: Ambulatory Visit | Attending: Dermatology | Admitting: Dermatology

## 2010-09-06 ENCOUNTER — Ambulatory Visit (INDEPENDENT_AMBULATORY_CARE_PROVIDER_SITE_OTHER): Payer: Medicare Other | Admitting: Cardiology

## 2010-09-06 DIAGNOSIS — I10 Essential (primary) hypertension: Secondary | ICD-10-CM

## 2010-09-06 DIAGNOSIS — I4891 Unspecified atrial fibrillation: Secondary | ICD-10-CM

## 2010-09-06 DIAGNOSIS — N63 Unspecified lump in unspecified breast: Secondary | ICD-10-CM

## 2010-09-06 MED ORDER — LOSARTAN POTASSIUM-HCTZ 50-12.5 MG PO TABS: 1.0000 | ORAL_TABLET | Freq: Every day | ORAL | Status: DC

## 2010-09-06 NOTE — Patient Instructions (Addendum)
Your physician has recommended you make the following change in your medication: STOP ATACAND HCT, START Hyzaar 50/12.5mg  take one by mouth daily  Your physician recommends that you schedule a follow-up appointment in: 3 MONTHS  Your physician recommends that you return for lab work in: 10 days (BMP 401.9, 427.31)

## 2010-09-06 NOTE — Discharge Summary (Signed)
NAME:  Ian Sullivan, Ian Sullivan NO.:  [id number]  MEDICAL RECORD NO.:  [id number]           PATIENT TYPE:  I  LOCATION:  5008                         FACILITY:  [facility]  PHYSICIAN:  Ian Sullivan, M.D.   DATE OF BIRTH:  Jan 17, 1928  DATE OF ADMISSION:  07/02/2010 DATE OF DISCHARGE:  07/05/2010                              DISCHARGE SUMMARY   ADMISSION DIAGNOSES: 1. Lumbar spinal stenosis L2-3 and L4-5, with foraminal entrapment L4-     5, status post decompression at L3-4 and L4-5, and status post L4-5     fusion. 2. History of congestive heart failure. 3. History of atrial fibrillation, treated with chronic Coumadin use. 4. Hypertension. 5. Anxiety and depression 6. Dyslipidemia. 7. Sleep apnea. 8. History of Barrett esophagus and esophageal motility disorder. 9. Dyslipidemia.  DISCHARGE DIAGNOSES: 1. Severe lumbar spinal stenosis with lateral recess entrapment at the     L2-3 and L3-4 levels entrapping the L3 and L4 nerve root     bilaterally.  Central stenosis with mild at L1-2 and at L2-3.     Solid fusion at L4-5. 2. History of congestive heart failure. 3. History of atrial fibrillation, treated with chronic Coumadin use. 4. Hypertension. 5. Anxiety and depression 6. Dyslipidemia. 7. Sleep apnea. 8. History of Barrett esophagus and esophageal motility disorder. 9. Dyslipidemia.  PROCEDURES:  On July 02, 2010, the patient underwent central laminectomy L2-3 carried upwards to the lateral recess at the L1-2 level and redo central laminectomy at L3-4 and L4-5.  Posterolateral fusion un- instrumented at the L3-4 level utilizing local bone graft with posterolateral technique.  This was performed by Dr. Otelia Sullivan assisted by Ian Deed, PA-C, under general anesthesia.  CONSULTATIONS:  [facility].  BRIEF HISTORY:  The patient is an 77 year old male, status post central decompression L3-4 and L4-5 with PLIF and posterolateral fusion at  L4-5 for grade 1-2 degenerative spondylolisthesis and severe spinal stenosis with bilateral footdrop.  This was a year and half prior to this admission.  He did fairly well postoperatively with return of strength in the lower extremities.  Over the last 6 months, he has noted progressive worsening and tendency to stoop and lean forward with difficulty standing upright and walking.  He has severe pain in the right lateral thigh with radiation into the right anterior thigh on the right.  MRI studies have demonstrated persistent lumbar spinal stenosis and lateral recess stenosis at the L2-3 and L3-4 levels.  It was felt that he would require surgical intervention as he has exhausted conservative measures including extensive physical therapy, epidural steroid injections, and now is requiring narcotic medications more regularly.  He was admitted for the procedure as stated above.  BRIEF HOSPITAL COURSE:  The patient tolerated the procedure under general anesthesia without complications.  Postoperatively, neurovascular and motor function of the lower extremities was intact. On the first postoperative day, Hemovac drain was removed without difficulty.  He utilized PCA analgesics and was gradually weaned to p.o. analgesics for discomfort.  He was monitored for postop ileus and remained on full liquid diet until his bowel function returned.  At  that time, he was placed on a regular diet.  The patient had difficulty with urinary retention initially; however, this resolved without need for medications.  The patient was fitted with an lumbar corset and was started on physical therapy.  He was slow to progress with physical therapy.  Eventually, he was able to ambulate in the room.  A Rehab consult was obtained and it was felt he would require inpatient rehabilitation.  He did continue to have some baseline quadriceps weakness which did improve slightly from surgery.  Dressing changes were done  daily and his wound was healing well.  He did have one episode of oversedation and his narcotic analgesics were adjusted as well as his gabapentin.  He returned to his baseline shortly following the adjustments in medication.  The patient was seen by Occupational Therapy for ADLs.  His Coumadin was restarted and he followed the pharmacy protocol during the hospital stay.  On July 05, 2010, he was stable for discharge to [facility] for continuation of his care.  PERTINENT LABORATORY VALUES:  Laboratory values available in the chart at this time include hemoglobin and hematocrit on July 03, 2010, at 10.5 and 32.4, and on July 04, 2010, of 11.0 and 33.8.  INR at discharge was 1.68.  Chemistry studies on July 03, 2010, were within normal limits and repeat on July 04, 2010, were also within normal limits.  PLAN:  The patient will be discharged to [facility] area.  There, he will continue to receive physical therapy for ambulation and gait training.  He will wear his lumbar corset at all times.  Occupational therapy for ADLs.  Dressing to be changed daily or as needed.  The wound of his low back can be wet for hygiene purposes.  He will not need a dressing after 10 days postop.  He will wear the lumbar corset at all times when out of bed.  The list of the patient's medications was sent with him to the [facility] for continuation.  He will continue on Coumadin as he is on Coumadin chronically for AFib.  Analgesics and muscle relaxers will be adjusted as needed by the physicians at the [facility].  He will follow up with Dr. Otelia Sullivan following his stay at rehab.  If there are questions or concerns regarding his orthopedic care, please notify Dr. Barbaraann Sullivan office.  CONDITION ON DISCHARGE:  Stable.     Ian Sullivan, P.A.   ______________________________ Ian Sullivan, M.D.    SMV/MEDQ  D:  08/07/2010  T:  08/08/2010  Job:   [id number]  Electronically Signed by Dorna Mai. on 08/28/2010 11:40:19 AM Electronically Signed by Vira Browns M.D. on 09/06/2010 07:45:56 PM

## 2010-09-07 NOTE — Progress Notes (Signed)
HPI:  Ian Sullivan is in for a visit.  He had his surgery, and he is still some bothered.  It was a big surgery, but he has tolerated it and continuing to do fairly well from a cardiac standpoint.  He brought in logs of his BP, and they range from 131-164 systolic, so in general they are not out of control.  He would like to change his medication because of the cost, and we were able to offer some suggestions today in order to do this.    Current Outpatient Prescriptions  Medication Sig Dispense Refill  . b complex vitamins tablet Take 1 tablet by mouth daily.        . Calcium 600 MG tablet Take 600 mg by mouth. Every other day       . carvedilol (COREG) 3.125 MG tablet Take by mouth. 3 tablets bid       . cholecalciferol (VITAMIN D) 1000 UNITS tablet Take 1,000 Units by mouth daily.        Marland Kitchen gabapentin (NEURONTIN) 300 MG capsule Take 300 mg by mouth. 2 tablets AM- 1 tablet at lunch and 2 tablets PM       . HYDROcodone-acetaminophen (NORCO) 5-325 MG per tablet Take 1 tablet by mouth every 4 (four) hours as needed. As needed       . KLOR-CON 10 10 MEQ CR tablet TAKE 1 TABLET EVERY DAY  30 tablet  6  . Omega 3-6-9 Fatty Acids (OMEGA 3-6-9 COMPLEX PO) Take 1 mg by mouth. 2 caps daily       . omeprazole (PRILOSEC) 20 MG capsule Take 20 mg by mouth daily.        . pravastatin (PRAVACHOL) 10 MG tablet Take 10 mg by mouth daily.        . TraMADol HCl 50 MG TBSO Take by mouth as needed.        . vitamin B-12 (CYANOCOBALAMIN) 250 MCG tablet Take 250 mcg by mouth daily.        . vitamin C (ASCORBIC ACID) 500 MG tablet Take 500 mg by mouth. About 2 or 3 a week.       . vitamin E 400 UNIT capsule Take 400 Units by mouth daily.        Marland Kitchen warfarin (COUMADIN) 5 MG tablet Take by mouth as directed.        Marland Kitchen losartan-hydrochlorothiazide (HYZAAR) 50-12.5 MG per tablet Take 1 tablet by mouth daily.  30 tablet  11    Allergies  Allergen Reactions  . Horse-Derived Products     Past Medical History  Diagnosis  Date  . Personal history of other diseases of circulatory system   . Unspecified essential hypertension   . Anxiety state, unspecified   . Depressive disorder, not elsewhere classified   . Other and unspecified hyperlipidemia   . Dyskinesia of esophagus   . Encounter for long-term (current) use of anticoagulants   . Ventral hernia, unspecified, with obstruction   . Duodenitis without mention of hemorrhage   . Unspecified gastritis and gastroduodenitis without mention of hemorrhage   . Unspecified hemorrhoids without mention of complication   . Diverticulosis of colon (without mention of hemorrhage)   . Diaphragmatic hernia without mention of obstruction or gangrene   . Barrett's esophagus   . Irritable bowel syndrome   . Unspecified sleep apnea     Past Surgical History  Procedure Date  . Nissen fundoplication 1982  . Turp vaporization 1980s  . Neuroplasty /  transposition median nerve at carpal tunnel bilateral 2003  . Appendectomy 1945  . Tonsillectomy   . Vasectomy   . Lumbar laminectomy   . Cervical disc arthroplasty 1977  . Small bowel obsturction     x4  . Cardiac catheterization 1991  . Right wrist release   . Central decompressive laminectomy     L2-L5    Family History  Problem Relation Age of Onset  . Stroke Mother   . Microcephaly Father     History   Social History  . Marital Status: Widowed    Spouse Name: N/A    Number of Children: N/A  . Years of Education: N/A   Occupational History  . Not on file.   Social History Main Topics  . Smoking status: Former Games developer  . Smokeless tobacco: Not on file  . Alcohol Use:   . Drug Use: No  . Sexually Active:    Other Topics Concern  . Not on file   Social History Narrative   Alcohol use- yes occasional    ROS: Please see the HPI.  All other systems reviewed and negative.  PHYSICAL EXAM:  BP 170/86  Pulse 73  Ht 5\' 1"  (1.549 m)  Wt 183 lb (83.008 kg)  BMI 34.58 kg/m2  General: Well  developed, well nourished, in no acute distress. Head:  Normocephalic and atraumatic. Neck: no JVD Lungs: Clear to auscultation and percussion. Heart: Iregularly irregular rhythm.  Rate is reasonably controlled.  Pulses: Pulses normal in all 4 extremities. Extremities: No clubbing or cyanosis. No edema. Neurologic: Alert and oriented x 3.  EKG:  Atrial fibrillation.  RBBB.  Rate controlled at 73  ASSESSMENT AND PLAN:

## 2010-09-08 NOTE — Assessment & Plan Note (Addendum)
He remains reasonably controlled at this point in time.  Based on the cost, he wants to switch to a generic, and we will institute Hyzaar as opposed to the Atacand HCT.  Doses should be comparable.  His ranges are acceptable.  He will watch them with the switch, and let us know if there is an issue.  He will need early follow up labs to make the change.

## 2010-09-08 NOTE — Assessment & Plan Note (Addendum)
Rate is controlled on the current blocking drug, namely carvedilol.  He is tolerating, so we will not change.

## 2010-09-20 ENCOUNTER — Ambulatory Visit (INDEPENDENT_AMBULATORY_CARE_PROVIDER_SITE_OTHER): Payer: Medicare Other | Admitting: *Deleted

## 2010-09-20 DIAGNOSIS — I4891 Unspecified atrial fibrillation: Secondary | ICD-10-CM

## 2010-09-20 LAB — POCT INR: INR: 2.5

## 2010-09-25 ENCOUNTER — Other Ambulatory Visit: Payer: Self-pay | Admitting: *Deleted

## 2010-09-25 MED ORDER — WARFARIN SODIUM 5 MG PO TABS: ORAL_TABLET | ORAL | Status: DC

## 2010-10-14 ENCOUNTER — Other Ambulatory Visit (INDEPENDENT_AMBULATORY_CARE_PROVIDER_SITE_OTHER): Payer: Medicare Other | Admitting: *Deleted

## 2010-10-14 DIAGNOSIS — I10 Essential (primary) hypertension: Secondary | ICD-10-CM

## 2010-10-14 LAB — BASIC METABOLIC PANEL
Calcium: 8.5 mg/dL (ref 8.4–10.5)
GFR: 94.46 mL/min (ref >60.00)
Glucose, Bld: 88 mg/dL (ref 70–99)
Potassium: 3.4 mEq/L — ABNORMAL LOW (ref 3.5–5.1)
Sodium: 133 mEq/L — ABNORMAL LOW (ref 135–145)

## 2010-10-15 ENCOUNTER — Telehealth: Payer: Self-pay | Admitting: Cardiology

## 2010-10-15 DIAGNOSIS — E876 Hypokalemia: Secondary | ICD-10-CM

## 2010-10-15 MED ORDER — POTASSIUM CHLORIDE CRYS ER 20 MEQ PO TBCR: 20.0000 meq | EXTENDED_RELEASE_TABLET | Freq: Every day | ORAL | Status: DC

## 2010-10-15 NOTE — Telephone Encounter (Signed)
Pt pcp had perscribed cipro and pt has a-fib and was told by pharm told pt to call to see if he should take it due the side effects of the medication. He is very concerned and wants a call asap

## 2010-10-15 NOTE — Telephone Encounter (Signed)
Patient's primary care doctor wanted to start him on Cipro for a bladder infection. Spoke with Cicero Duck, RN in the coumadin clinic & Cipro may actually increase the effects of Coumadin. She did inform me that the 3 day treatment is usually ok. Patient was prescribed the 3 day treatment so he will proceed with taking the antibiotic. Has coumadin appointment 10/18/10.

## 2010-10-15 NOTE — Telephone Encounter (Signed)
Potassium increased to 20 meq daily. New prescription sent

## 2010-10-18 ENCOUNTER — Ambulatory Visit (INDEPENDENT_AMBULATORY_CARE_PROVIDER_SITE_OTHER): Payer: Medicare Other | Admitting: *Deleted

## 2010-10-18 DIAGNOSIS — I4891 Unspecified atrial fibrillation: Secondary | ICD-10-CM

## 2010-10-18 LAB — POCT INR: INR: 2.6

## 2010-11-15 ENCOUNTER — Encounter: Payer: Medicare Other | Admitting: *Deleted

## 2010-11-15 ENCOUNTER — Ambulatory Visit (INDEPENDENT_AMBULATORY_CARE_PROVIDER_SITE_OTHER): Payer: Medicare Other | Admitting: *Deleted

## 2010-11-15 DIAGNOSIS — I4891 Unspecified atrial fibrillation: Secondary | ICD-10-CM

## 2010-12-10 ENCOUNTER — Encounter: Payer: Self-pay | Admitting: Cardiology

## 2010-12-10 ENCOUNTER — Ambulatory Visit: Payer: Medicare Other | Admitting: Cardiology

## 2010-12-11 ENCOUNTER — Ambulatory Visit: Payer: Medicare Other | Admitting: Cardiology

## 2010-12-16 ENCOUNTER — Telehealth: Payer: Self-pay | Admitting: Cardiology

## 2010-12-16 NOTE — Telephone Encounter (Signed)
Dr Riley Kill reviewed form from [facility] and approved for pt to discontinue Coumadin 5 days prior to injection. Form will be faxed back to [facility] (fax [phone]).

## 2010-12-16 NOTE — Telephone Encounter (Signed)
Pt needs cardiac clearance to get injection Aundra Millet has faxed form several times pls call with clearance so they can schedule office# [phone]  Fax# [phone]

## 2010-12-17 ENCOUNTER — Ambulatory Visit (INDEPENDENT_AMBULATORY_CARE_PROVIDER_SITE_OTHER): Payer: Medicare Other | Admitting: *Deleted

## 2010-12-17 ENCOUNTER — Encounter: Payer: Self-pay | Admitting: Cardiology

## 2010-12-17 ENCOUNTER — Ambulatory Visit (INDEPENDENT_AMBULATORY_CARE_PROVIDER_SITE_OTHER): Payer: Medicare Other | Admitting: Cardiology

## 2010-12-17 VITALS — BP 138/80 | HR 66 | Resp 18 | Ht 72.0 in | Wt 171.8 lb

## 2010-12-17 DIAGNOSIS — E785 Hyperlipidemia, unspecified: Secondary | ICD-10-CM

## 2010-12-17 DIAGNOSIS — I10 Essential (primary) hypertension: Secondary | ICD-10-CM

## 2010-12-17 DIAGNOSIS — I4891 Unspecified atrial fibrillation: Secondary | ICD-10-CM

## 2010-12-17 NOTE — Telephone Encounter (Signed)
error 

## 2010-12-17 NOTE — Progress Notes (Signed)
HPI:  [name] is seen in follow up.  His quality of life has been diminished by his back pain.  His primary care doctor has given him some Cymbalta, and he is also taking Tramadol in an attempt to lower his pain.  He does not want to get hooked on narcotics.  No chest pain.  Not as active, and as mentioned, QOL is diminished by the pain.    Current Outpatient Prescriptions  Medication Sig Dispense Refill  . b complex vitamins tablet Take 1 tablet by mouth daily.        . Calcium 600 MG tablet Take 600 mg by mouth. Every other day       . carvedilol (COREG) 3.125 MG tablet Take by mouth. 3 tablets bid       . cholecalciferol (VITAMIN D) 1000 UNITS tablet Take 1,000 Units by mouth daily.        . Ferrous Sulfate (IRON) 325 (65 FE) MG TABS Take 1 tablet by mouth daily.        Marland Kitchen HYDROcodone-acetaminophen (NORCO) 5-325 MG per tablet Take 1 tablet by mouth every 4 (four) hours as needed. As needed       . KLOR-CON 10 10 MEQ CR tablet TAKE 1 TABLET EVERY DAY  30 tablet  6  . losartan-hydrochlorothiazide (HYZAAR) 100-12.5 MG per tablet Take 1 tablet by mouth daily.        . Omega 3-6-9 Fatty Acids (OMEGA 3-6-9 COMPLEX PO) Take 1 mg by mouth. 2 caps daily       . omeprazole (PRILOSEC) 20 MG capsule Take 20 mg by mouth daily.        . pravastatin (PRAVACHOL) 10 MG tablet Take 10 mg by mouth daily.        . TraMADol HCl 50 MG TBSO Take by mouth as needed.        . vitamin B-12 (CYANOCOBALAMIN) 250 MCG tablet Take 250 mcg by mouth daily.        . vitamin C (ASCORBIC ACID) 500 MG tablet Take 500 mg by mouth. About 2 or 3 a week.       . vitamin E 400 UNIT capsule Take 400 Units by mouth daily.        Marland Kitchen warfarin (COUMADIN) 5 MG tablet Take as directed by Anticoagulation clinic   40 tablet  3    Allergies  Allergen Reactions  . Horse-Derived Products     Past Medical History  Diagnosis Date  . Personal history of other diseases of circulatory system   . Unspecified essential hypertension   .  Anxiety state, unspecified   . Depressive disorder, not elsewhere classified   . Other and unspecified hyperlipidemia   . Dyskinesia of esophagus   . Encounter for long-term (current) use of anticoagulants   . Ventral hernia, unspecified, with obstruction   . Duodenitis without mention of hemorrhage   . Unspecified gastritis and gastroduodenitis without mention of hemorrhage   . Unspecified hemorrhoids without mention of complication   . Diverticulosis of colon (without mention of hemorrhage)   . Diaphragmatic hernia without mention of obstruction or gangrene   . Barrett's esophagus   . Irritable bowel syndrome   . Unspecified sleep apnea     Past Surgical History  Procedure Date  . Nissen fundoplication 1982  . Turp vaporization 1980s  . Neuroplasty / transposition median nerve at carpal tunnel bilateral 2003  . Appendectomy 1945  . Tonsillectomy   . Vasectomy   .  Lumbar laminectomy   . Cervical disc arthroplasty 1977  . Small bowel obsturction     x4  . Cardiac catheterization 1991  . Right wrist release   . Central decompressive laminectomy     L2-L5    Family History  Problem Relation Age of Onset  . Stroke Mother   . Microcephaly Father     History   Social History  . Marital Status: Widowed    Spouse Name: N/A    Number of Children: N/A  . Years of Education: N/A   Occupational History  . Not on file.   Social History Main Topics  . Smoking status: Former Games developer  . Smokeless tobacco: Not on file  . Alcohol Use:   . Drug Use: No  . Sexually Active:    Other Topics Concern  . Not on file   Social History Narrative   Alcohol use- yes occasional    ROS: Please see the HPI.  All other systems reviewed and negative.  PHYSICAL EXAM:  BP 138/80  Pulse 66  Resp 18  Ht 6' (1.829 m)  Wt 171 lb 12.8 oz (77.928 kg)  BMI 23.30 kg/m2  General: Well developed, well nourished, in no acute distress. Head:  Normocephalic and atraumatic. Neck: no  JVD Lungs: Clear to auscultation and percussion. Heart: Normal S1 and S2.  No murmur, rubs or gallops.  Abdomen:  Normal bowel sounds; soft; non tender; no organomegaly Pulses: Pulses normal in all 4 extremities. Extremities: No clubbing or cyanosis. No edema. Neurologic: Alert and oriented x 3.  EKG:  Atrial fibrillation with RBBB.  -**  ASSESSMENT AND PLAN:

## 2010-12-17 NOTE — Patient Instructions (Signed)
Your physician has requested that you regularly monitor and record your blood pressure readings at home. Please use the same machine at the same time of day to check your readings and record them to bring to your follow-up visit.  Your physician recommends that you continue on your current medications as directed. Please refer to the Current Medication list given to you today.  Your physician wants you to follow-up in: 4 MONTHS.  You will receive a reminder letter in the mail two months in advance. If you don't receive a letter, please call our office to schedule the follow-up appointment.

## 2010-12-24 NOTE — Assessment & Plan Note (Signed)
Followed by Dr. Moreira 

## 2010-12-24 NOTE — Assessment & Plan Note (Signed)
Rate is controlled at present.  Remains on warfarin between need for treatment for his back.

## 2010-12-24 NOTE — Assessment & Plan Note (Signed)
Well controlled at present.  No changes. 

## 2010-12-30 LAB — BASIC METABOLIC PANEL
BUN: 7
CO2: 25
CO2: 29
Calcium: 7.7 — ABNORMAL LOW
Creatinine, Ser: 0.92
GFR calc Af Amer: >60
GFR calc non Af Amer: >60
GFR calc non Af Amer: >60
Glucose, Bld: 80
Glucose, Bld: 87
Potassium: 3.6
Sodium: 139
Sodium: 140

## 2010-12-30 LAB — DIFFERENTIAL
Eosinophils Absolute: 0.1
Eosinophils Relative: 3
Lymphocytes Relative: 29
Lymphs Abs: 1.5
Monocytes Absolute: 0.5
Monocytes Relative: 9

## 2010-12-30 LAB — TYPE AND SCREEN
ABO/RH(D): AB POS
Antibody Screen: NEGATIVE

## 2010-12-30 LAB — CBC
MCHC: 34.3
Platelets: 276
RBC: 3.79 — ABNORMAL LOW
RDW: 14.4

## 2010-12-30 LAB — COMPREHENSIVE METABOLIC PANEL
ALT: 30
AST: 38 — ABNORMAL HIGH
Albumin: 4
CO2: 28
Calcium: 9.4
Creatinine, Ser: 0.88
GFR calc Af Amer: >60
Sodium: 142
Total Protein: 5.9 — ABNORMAL LOW

## 2010-12-30 LAB — PROTIME-INR
INR: 1.5
INR: 1.9 — ABNORMAL HIGH
Prothrombin Time: 17 — ABNORMAL HIGH
Prothrombin Time: 18.6 — ABNORMAL HIGH
Prothrombin Time: 22.8 — ABNORMAL HIGH

## 2010-12-30 LAB — URINALYSIS, ROUTINE W REFLEX MICROSCOPIC
Glucose, UA: NEGATIVE
Hgb urine dipstick: NEGATIVE
Specific Gravity, Urine: 1.021

## 2010-12-30 LAB — ABO/RH: ABO/RH(D): AB POS

## 2010-12-30 LAB — HEMOGLOBIN AND HEMATOCRIT, BLOOD
HCT: 32.1 — ABNORMAL LOW
Hemoglobin: 10.8 — ABNORMAL LOW
Hemoglobin: 9.2 — ABNORMAL LOW

## 2011-01-10 LAB — BASIC METABOLIC PANEL
BUN: 11
CO2: 29
Calcium: 8.9
Chloride: 103
Creatinine, Ser: 0.85
Glucose, Bld: 94

## 2011-01-14 ENCOUNTER — Ambulatory Visit (INDEPENDENT_AMBULATORY_CARE_PROVIDER_SITE_OTHER): Payer: Medicare Other | Admitting: *Deleted

## 2011-01-14 DIAGNOSIS — I4891 Unspecified atrial fibrillation: Secondary | ICD-10-CM

## 2011-02-11 ENCOUNTER — Ambulatory Visit (INDEPENDENT_AMBULATORY_CARE_PROVIDER_SITE_OTHER): Payer: Medicare Other | Admitting: *Deleted

## 2011-02-11 DIAGNOSIS — I4891 Unspecified atrial fibrillation: Secondary | ICD-10-CM

## 2011-02-11 LAB — POCT INR: INR: 2.1

## 2011-02-25 ENCOUNTER — Telehealth: Payer: Self-pay

## 2011-02-25 ENCOUNTER — Other Ambulatory Visit: Payer: Self-pay | Admitting: *Deleted

## 2011-02-25 MED ORDER — WARFARIN SODIUM 5 MG PO TABS: 5.0000 mg | ORAL_TABLET | ORAL | Status: DC

## 2011-02-25 MED ORDER — WARFARIN SODIUM 5 MG PO TABS: ORAL_TABLET | ORAL | Status: DC

## 2011-03-11 ENCOUNTER — Ambulatory Visit (INDEPENDENT_AMBULATORY_CARE_PROVIDER_SITE_OTHER): Payer: Medicare Other | Admitting: *Deleted

## 2011-03-11 DIAGNOSIS — I4891 Unspecified atrial fibrillation: Secondary | ICD-10-CM

## 2011-03-17 ENCOUNTER — Other Ambulatory Visit: Payer: Self-pay | Admitting: Pharmacist

## 2011-03-17 ENCOUNTER — Encounter: Payer: Medicare Other | Admitting: *Deleted

## 2011-03-17 DIAGNOSIS — I4891 Unspecified atrial fibrillation: Secondary | ICD-10-CM

## 2011-03-18 ENCOUNTER — Other Ambulatory Visit (INDEPENDENT_AMBULATORY_CARE_PROVIDER_SITE_OTHER): Payer: Medicare Other | Admitting: *Deleted

## 2011-03-18 DIAGNOSIS — Z5181 Encounter for therapeutic drug level monitoring: Secondary | ICD-10-CM

## 2011-03-18 DIAGNOSIS — I4891 Unspecified atrial fibrillation: Secondary | ICD-10-CM

## 2011-03-18 DIAGNOSIS — Z7901 Long term (current) use of anticoagulants: Secondary | ICD-10-CM

## 2011-03-18 LAB — PROTIME-INR: INR: 1.3 ratio — ABNORMAL HIGH (ref 0.8–1.0)

## 2011-04-08 ENCOUNTER — Ambulatory Visit (INDEPENDENT_AMBULATORY_CARE_PROVIDER_SITE_OTHER): Payer: Medicare Other | Admitting: *Deleted

## 2011-04-08 DIAGNOSIS — I4891 Unspecified atrial fibrillation: Secondary | ICD-10-CM

## 2011-04-08 LAB — POCT INR: INR: 2.7

## 2011-04-22 ENCOUNTER — Ambulatory Visit (INDEPENDENT_AMBULATORY_CARE_PROVIDER_SITE_OTHER): Payer: Medicare Other | Admitting: Cardiology

## 2011-04-22 VITALS — BP 150/90 | HR 75 | Ht 71.0 in | Wt 178.0 lb

## 2011-04-22 DIAGNOSIS — I4891 Unspecified atrial fibrillation: Secondary | ICD-10-CM

## 2011-04-22 DIAGNOSIS — I10 Essential (primary) hypertension: Secondary | ICD-10-CM

## 2011-04-22 DIAGNOSIS — E785 Hyperlipidemia, unspecified: Secondary | ICD-10-CM

## 2011-04-22 DIAGNOSIS — Z8679 Personal history of other diseases of the circulatory system: Secondary | ICD-10-CM

## 2011-04-22 MED ORDER — CARVEDILOL 12.5 MG PO TABS: 12.5000 mg | ORAL_TABLET | Freq: Two times a day (BID) | ORAL | Status: DC

## 2011-04-22 NOTE — Assessment & Plan Note (Signed)
Last LDL was 80mg  with Dr. Ludwig Clarks.

## 2011-04-22 NOTE — Patient Instructions (Signed)
Your physician recommends that you schedule a follow-up appointment in: 4 MONTHS  Your physician has recommended you make the following change in your medication: INCREASE Carvedilol 12.5mg  take one by mouth twice a day

## 2011-04-22 NOTE — Assessment & Plan Note (Signed)
He brought me his BP levels, and they remain modestly elevated, although quite variable.  We will increase his carvedilol to 12.5 mg twice daily.  He will let us know, and we will see him back again in four months.

## 2011-04-22 NOTE — Progress Notes (Signed)
HPI:  The patient is in for follow up.  Overall he is stable.  He thinks Cymbalta helped with his depression.  No longer on Tramadol.  He is on oxycodone, and is scheduled to get a spinal chord stimulator.  No chest pain, and no progressive shortness of breath.  He did bring his labs in and he has mild anemia.  Takes on Fe tablet, although the source of anemia is unknown.    Current Outpatient Prescriptions  Medication Sig Dispense Refill  . b complex vitamins tablet Take 1 tablet by mouth daily.        . Calcium 600 MG tablet Take 600 mg by mouth. Every other day       . carvedilol (COREG) 12.5 MG tablet Take 1 tablet (12.5 mg total) by mouth 2 (two) times daily with a meal.  90 tablet  3  . cholecalciferol (VITAMIN D) 1000 UNITS tablet Take 1,000 Units by mouth daily.        . Ferrous Sulfate (IRON) 325 (65 FE) MG TABS Take 1 tablet by mouth daily.        Marland Kitchen KLOR-CON 10 10 MEQ CR tablet TAKE 1 TABLET EVERY DAY  30 tablet  6  . losartan-hydrochlorothiazide (HYZAAR) 100-12.5 MG per tablet Take 1 tablet by mouth daily.        . Omega 3-6-9 Fatty Acids (OMEGA 3-6-9 COMPLEX PO) Take 1 mg by mouth. 2 caps daily       . omeprazole (PRILOSEC) 20 MG capsule Take 20 mg by mouth daily.        Marland Kitchen oxyCODONE-acetaminophen (PERCOCET) 7.5-325 MG per tablet Take 1 tablet by mouth every 4 (four) hours as needed.        . pravastatin (PRAVACHOL) 10 MG tablet Take 10 mg by mouth daily.        . vitamin B-12 (CYANOCOBALAMIN) 250 MCG tablet Take 250 mcg by mouth daily.        . vitamin C (ASCORBIC ACID) 500 MG tablet Take 500 mg by mouth. About 2 or 3 a week.       . vitamin E 400 UNIT capsule Take 400 Units by mouth daily.        Marland Kitchen warfarin (COUMADIN) 5 MG tablet Take 1 tablet (5 mg total) by mouth as directed.  40 tablet  3  . DISCONTD: carvedilol (COREG) 3.125 MG tablet Take by mouth. 3 tablets bid         Allergies  Allergen Reactions  . Horse-Derived Products     Past Medical History  Diagnosis Date    . Personal history of other diseases of circulatory system   . Unspecified essential hypertension   . Anxiety state, unspecified   . Depressive disorder, not elsewhere classified   . Other and unspecified hyperlipidemia   . Dyskinesia of esophagus   . Encounter for long-term (current) use of anticoagulants   . Ventral hernia, unspecified, with obstruction   . Duodenitis without mention of hemorrhage   . Unspecified gastritis and gastroduodenitis without mention of hemorrhage   . Unspecified hemorrhoids without mention of complication   . Diverticulosis of colon (without mention of hemorrhage)   . Diaphragmatic hernia without mention of obstruction or gangrene   . Barrett's esophagus   . Irritable bowel syndrome   . Unspecified sleep apnea     Past Surgical History  Procedure Date  . Nissen fundoplication 1982  . Turp vaporization 1980s  . Neuroplasty / transposition median nerve at  carpal tunnel bilateral 2003  . Appendectomy 1945  . Tonsillectomy   . Vasectomy   . Lumbar laminectomy   . Cervical disc arthroplasty 1977  . Small bowel obsturction     x4  . Cardiac catheterization 1991  . Right wrist release   . Central decompressive laminectomy     L2-L5    Family History  Problem Relation Age of Onset  . Stroke Mother   . Microcephaly Father     History   Social History  . Marital Status: Widowed    Spouse Name: N/A    Number of Children: N/A  . Years of Education: N/A   Occupational History  . Not on file.   Social History Main Topics  . Smoking status: Former Games developer  . Smokeless tobacco: Not on file  . Alcohol Use:   . Drug Use: No  . Sexually Active:    Other Topics Concern  . Not on file   Social History Narrative   Alcohol use- yes occasional    ROS: Please see the HPI.  All other systems reviewed and negative.  PHYSICAL EXAM:  BP 150/90  Pulse 75  Ht 5\' 11"  (1.803 m)  Wt 80.74 kg (178 lb)  BMI 24.83 kg/m2  General: Well developed,  well nourished, in no acute distress. Head:  Normocephalic and atraumatic. Neck: no JVD Lungs: Clear to auscultation and percussion. Heart: Irregularly, irregular rhythm with minimal soft apical murmur.  Abdomen:  Normal bowel sounds; soft; non tender; no organomegaly Pulses: Pulses normal in all 4 extremities. Extremities: No clubbing or cyanosis. No edema. Neurologic: Alert and oriented x 3.  EKG:  Atrial fib with controlled ventricular response.  RBBB.  No acute changes.    ASSESSMENT AND PLAN:

## 2011-04-22 NOTE — Assessment & Plan Note (Signed)
Rate is currently controlled.  Recent labs at his primary care doctors office suggested mild anemia.  I will forward to Dr. Ludwig Clarks regarding deferring to him regarding weather this anemia is indicative of iron deficiency.  He is on warfarin for stroke prophylaxis.  The patient says that his stools have been negative in the past.

## 2011-05-13 ENCOUNTER — Other Ambulatory Visit: Payer: Self-pay | Admitting: Neurosurgery

## 2011-05-13 DIAGNOSIS — M546 Pain in thoracic spine: Secondary | ICD-10-CM

## 2011-05-17 ENCOUNTER — Ambulatory Visit
Admission: RE | Admit: 2011-05-17 | Discharge: 2011-05-17 | Disposition: A | Discharge status: Home / Self Care | Payer: Medicare Other | Source: Ambulatory Visit | Attending: Neurosurgery | Admitting: Neurosurgery

## 2011-05-17 DIAGNOSIS — M546 Pain in thoracic spine: Secondary | ICD-10-CM

## 2011-05-20 ENCOUNTER — Ambulatory Visit (INDEPENDENT_AMBULATORY_CARE_PROVIDER_SITE_OTHER): Payer: Medicare Other | Admitting: *Deleted

## 2011-05-20 ENCOUNTER — Telehealth: Payer: Self-pay | Admitting: Cardiology

## 2011-05-20 DIAGNOSIS — Z7901 Long term (current) use of anticoagulants: Secondary | ICD-10-CM

## 2011-05-20 DIAGNOSIS — I4891 Unspecified atrial fibrillation: Secondary | ICD-10-CM

## 2011-05-20 LAB — POCT INR: INR: 2.8

## 2011-05-20 NOTE — Telephone Encounter (Signed)
I spoke with Camelia Eng and made him aware that Dr Riley Kill signed form on 05/19/11 (Coumadin can be held. There is increased risk of stroke with holding of warfarin for procedures--per Dr Riley Kill).  Form faxed to [facility].

## 2011-05-20 NOTE — Telephone Encounter (Signed)
New WUJ:[id number] from [facility] calling wanting to check and see if we received surgical clearance for pt. Please return call to discuss further.  Please ask for Larita Fife or Camelia Eng

## 2011-05-23 ENCOUNTER — Telehealth: Payer: Self-pay | Admitting: Internal Medicine

## 2011-05-23 NOTE — Telephone Encounter (Signed)
Patient is having some nausea and is due for a colon.  He would like to come in and see Dr Leone Payor and discuss.  He also has a history of Barrett's and wants to make sure he doesn't need an endo.  He is scheduled for an office visit with Dr Leone Payor for 06/04/11

## 2011-06-04 ENCOUNTER — Ambulatory Visit (INDEPENDENT_AMBULATORY_CARE_PROVIDER_SITE_OTHER): Payer: Medicare Other | Admitting: Internal Medicine

## 2011-06-04 ENCOUNTER — Encounter: Payer: Self-pay | Admitting: Internal Medicine

## 2011-06-04 VITALS — BP 122/68 | HR 84 | Ht 71.5 in | Wt 184.4 lb

## 2011-06-04 DIAGNOSIS — Z1211 Encounter for screening for malignant neoplasm of colon: Secondary | ICD-10-CM

## 2011-06-04 DIAGNOSIS — R490 Dysphonia: Secondary | ICD-10-CM

## 2011-06-04 DIAGNOSIS — K2270 Barrett's esophagus without dysplasia: Secondary | ICD-10-CM

## 2011-06-04 NOTE — Progress Notes (Signed)
  Subjective:    Patient ID: Ian Sullivan, male    DOB: September 12, 1927, 78 y.o.   MRN: [id number]  HPI This elderly white man returns because of increasing reflux. He had a prior Nissen fundoplication and thinks it might have slipped. Several times a month he is noting heartburn having to take an extra omeprazole to control it. He denies dysphagia. His main problem is worsening back pain, from spinal stenosis. He is going to have a neurostimulator placed in his back tomorrow.  He also notes a small hernia bulge in his midabdomen at times. Not new, easily reducible.  He also brings up chronic recurrent hoarseness and throat clearing which is probably somewhat worse. He does not remember ever seeing your nose and throat specialist. He is taking iron again, having been on that off and on for many years with a known chronic recurrent low grade anemia.  He has a redundant colon and could not have a complete colonoscopy back in 2008. CT colonoscopy did not reveal any polyps at that time. He also has Barrett's esophagus and had persistent intestinal metaplasia without dysplasia in 2010. We had decided not to continue screening and surveillance upper endoscopy for this.  S. medical history, occasions and allergies, past surgical history reviewed and updated in the medical record.  Review of Systems As above    Objective:   Physical Exam General:  Chronically ill and somewhat frail Eyes:   anicteric Lungs:  clear Heart:  S1S2 no rubs, murmurs or gallops Abdomen:  soft and nontender, BS+, small reducible ventral hernia above umbilicus Ext:   Skin is brown and hyperpigmented - erythematous, loss of hair with trace edema in LE's          Assessment & Plan:   1. Barrett's esophagus   2. Hoarseness   3. Special screening for malignant neoplasms, colon    As far as the Barrett's and hoarseness and GERD, I suggested increasing his omeprazole to 40 mg daily or 20 mg twice a day. He was not  inclined to do so.  We discussed further colon cancer screening, and he was not inclined to continue that as well. Given his age and comorbidities it probably does not make sense either.  He is having increasing GERD symptoms, so I've asked him to return in about 3 months. His mind is focused on getting his pain under control as his quality of life is really suffering. He would not make sense to put this meds or any procedures at this time given the overall situation, and he may not need any. Not sure why he declined increasing his PPI therapy though I think he is somewhat concerned about side effects from that.

## 2011-06-04 NOTE — Patient Instructions (Signed)
Dr. Leone Payor would like for you to try and cut out all caffeine from your diet.  Please read labels.

## 2011-06-10 ENCOUNTER — Telehealth: Payer: Self-pay | Admitting: Pharmacist

## 2011-06-10 NOTE — Telephone Encounter (Signed)
Pt was scheduled to have a spinal procedure on 3/7 but developed a UTI and was started on Cipro.  He restarted his Coumadin on 3/8.  Will reschedule his INR check for 1 week after restarting his Coumadin.

## 2011-06-13 ENCOUNTER — Ambulatory Visit (INDEPENDENT_AMBULATORY_CARE_PROVIDER_SITE_OTHER): Payer: Medicare Other | Admitting: Pharmacist

## 2011-06-13 DIAGNOSIS — I4891 Unspecified atrial fibrillation: Secondary | ICD-10-CM

## 2011-06-13 DIAGNOSIS — Z7901 Long term (current) use of anticoagulants: Secondary | ICD-10-CM

## 2011-06-13 LAB — POCT INR: INR: 1.9

## 2011-06-23 ENCOUNTER — Other Ambulatory Visit: Payer: Self-pay | Admitting: Cardiology

## 2011-06-30 HISTORY — PX: SPINAL CORD STIMULATOR IMPLANT: SHX2422

## 2011-07-07 ENCOUNTER — Telehealth: Payer: Self-pay

## 2011-07-07 NOTE — Telephone Encounter (Signed)
Per previous phone note from 05/20/11, pt has been cleared to hold Coumadin prior.  Dr Riley Kill signed form on 05/19/11 (Coumadin can be held. There is increased risk of stroke with holding of warfarin for procedures--per Dr Riley Kill). Form faxed to [facility]. Pt just calling to make [place] aware pt is off Coumadin at present for surgery on 07/10/11.

## 2011-07-21 ENCOUNTER — Other Ambulatory Visit: Payer: Self-pay | Admitting: Cardiology

## 2011-07-21 MED ORDER — POTASSIUM CHLORIDE ER 10 MEQ PO TBCR: 10.0000 meq | EXTENDED_RELEASE_TABLET | Freq: Every day | ORAL | Status: DC

## 2011-07-23 ENCOUNTER — Other Ambulatory Visit: Payer: Self-pay | Admitting: *Deleted

## 2011-07-23 MED ORDER — POTASSIUM CHLORIDE ER 10 MEQ PO TBCR: 10.0000 meq | EXTENDED_RELEASE_TABLET | Freq: Every day | ORAL | Status: DC

## 2011-07-25 ENCOUNTER — Ambulatory Visit (INDEPENDENT_AMBULATORY_CARE_PROVIDER_SITE_OTHER): Payer: Medicare Other

## 2011-07-25 DIAGNOSIS — Z7901 Long term (current) use of anticoagulants: Secondary | ICD-10-CM

## 2011-07-25 DIAGNOSIS — I4891 Unspecified atrial fibrillation: Secondary | ICD-10-CM

## 2011-08-26 ENCOUNTER — Ambulatory Visit (INDEPENDENT_AMBULATORY_CARE_PROVIDER_SITE_OTHER): Payer: Medicare Other | Admitting: Pharmacist

## 2011-08-26 ENCOUNTER — Ambulatory Visit (INDEPENDENT_AMBULATORY_CARE_PROVIDER_SITE_OTHER): Payer: Medicare Other | Admitting: Cardiology

## 2011-08-26 ENCOUNTER — Encounter: Payer: Self-pay | Admitting: Cardiology

## 2011-08-26 VITALS — BP 150/80 | HR 69 | Resp 17 | Ht 71.0 in | Wt 179.0 lb

## 2011-08-26 DIAGNOSIS — E785 Hyperlipidemia, unspecified: Secondary | ICD-10-CM

## 2011-08-26 DIAGNOSIS — I4891 Unspecified atrial fibrillation: Secondary | ICD-10-CM

## 2011-08-26 DIAGNOSIS — Z7901 Long term (current) use of anticoagulants: Secondary | ICD-10-CM

## 2011-08-26 DIAGNOSIS — I10 Essential (primary) hypertension: Secondary | ICD-10-CM

## 2011-08-26 MED ORDER — AMLODIPINE BESYLATE 5 MG PO TABS: 5.0000 mg | ORAL_TABLET | Freq: Every day | ORAL | Status: DC

## 2011-08-26 NOTE — Assessment & Plan Note (Addendum)
On treatment.  Last LDL was 81mg /dl.  Nicely managed by Dr. Ludwig Clarks.

## 2011-08-26 NOTE — Patient Instructions (Addendum)
Your physician recommends that you schedule a follow-up appointment in: 4 WEEKS with Dr Riley Kill  Your physician has recommended you make the following change in your medication: START Amlodipine 5mg  once a day  Your physician has requested that you regularly monitor and record your blood pressure readings at home. Please use the same machine at the same time of day to check your readings and record them to bring to your follow-up visit.

## 2011-08-26 NOTE — Progress Notes (Signed)
HPI:  The patient is doing well. He has stimulator placed into his back. Now he can stand and walk, and the pain is markedly improved. We're pleased for him. He has seen his primary care physician, and persistent hypertension has been a continuing issue.  He also had an episode of sharp right-sided chest pain that lasted less than a minute; it was not typical for ischemia.  He has been somewhat more active, has not had chest pressure with exertion. We reviewed his blood pressures carefully. Also diastolics are in the range of 90 or above and his systolics generally are between 150-- 170.  Current Outpatient Prescriptions  Medication Sig Dispense Refill  . b complex vitamins tablet Take 1 tablet by mouth daily.        . Calcium 600 MG tablet Take 600 mg by mouth. Every other day       . carvedilol (COREG) 12.5 MG tablet Take 1 tablet (12.5 mg total) by mouth 2 (two) times daily with a meal.  90 tablet  3  . cholecalciferol (VITAMIN D) 1000 UNITS tablet Take 1,000 Units by mouth daily.        . Ferrous Sulfate (IRON) 325 (65 FE) MG TABS Take 1 tablet by mouth daily.        Providence Lanius CAPS Take 1 capsule by mouth daily.      Marland Kitchen losartan-hydrochlorothiazide (HYZAAR) 100-12.5 MG per tablet Take 1 tablet by mouth daily.        Marland Kitchen omeprazole (PRILOSEC) 20 MG capsule Take 20 mg by mouth daily.        Marland Kitchen oxyCODONE-acetaminophen (PERCOCET) 7.5-325 MG per tablet Take 1 tablet by mouth every 4 (four) hours as needed.        . potassium chloride (KLOR-CON 10) 10 MEQ tablet Take 1 tablet (10 mEq total) by mouth daily.  30 tablet  6  . pravastatin (PRAVACHOL) 10 MG tablet Take 10 mg by mouth daily.        . vitamin B-12 (CYANOCOBALAMIN) 250 MCG tablet Take 250 mcg by mouth daily.        . vitamin C (ASCORBIC ACID) 500 MG tablet Take 500 mg by mouth. About 2 or 3 a week.       . vitamin E 400 UNIT capsule Take 400 Units by mouth daily.        Marland Kitchen warfarin (COUMADIN) 5 MG tablet Take 1 tablet (5 mg total) by mouth  as directed. Take as directed by Coumadin clinic  40 tablet  3    Allergies  Allergen Reactions  . Horse-Derived Products     Past Medical History  Diagnosis Date  . S/P atrial septal defect closure, spontaneous   . Unspecified essential hypertension   . Anxiety state, unspecified     past at death of spouse  . Depressive disorder, not elsewhere classified     past at death of spouse  . Other and unspecified hyperlipidemia   . Dyskinesia of esophagus   . Encounter for long-term (current) use of anticoagulants   . Hiatal hernia   . Duodenitis without mention of hemorrhage   . Unspecified gastritis and gastroduodenitis without mention of hemorrhage   . Unspecified hemorrhoids without mention of complication   . Diverticulosis of colon (without mention of hemorrhage)   . Barrett's esophagus   . Irritable bowel syndrome   . Unspecified sleep apnea   . Anemia   . Cardiac arrhythmia due to congenital heart disease   .  Atrial fibrillation   . Skin cancer (melanoma)   . Status post dilation of esophageal narrowing   . GERD (gastroesophageal reflux disease)     Past Surgical History  Procedure Date  . Nissen fundoplication 1982  . Turp vaporization 1980s  . Carpal tunnel release 2003    bilateral  . Appendectomy 1945  . Tonsillectomy   . Vasectomy   . Lumbar laminectomy   . Small bowel obsturction     x4  . Cardiac catheterization 1991  . Central decompressive laminectomy     L2-L5    Family History  Problem Relation Age of Onset  . Stroke Mother   . Coronary artery disease Father     History   Social History  . Marital Status: Widowed    Spouse Name: N/A    Number of Children: 1  . Years of Education: N/A   Occupational History  . retired    Social History Main Topics  . Smoking status: Former Smoker    Quit date: 03/31/1972  . Smokeless tobacco: Never Used  . Alcohol Use: Yes     ocass  . Drug Use: No  . Sexually Active: Not on file   Other  Topics Concern  . Not on file   Social History Narrative   Alcohol use- yes occasional    ROS: Please see the HPI.  All other systems reviewed and negative.  PHYSICAL EXAM:  BP 150/80  Pulse 69  Resp 17  Ht 5\' 11"  (1.803 m)  Wt 179 lb (81.194 kg)  BMI 24.97 kg/m2  General: Well developed, well nourished, in no acute distress. Head:  Normocephalic and atraumatic. Neck: no JVD Lungs: Clear to auscultation and percussion. Heart: irregularly irregular rhythm.  No significant murmur.   Abdomen:  Normal bowel sounds; soft; non tender; no organomegaly Pulses: Pulses normal in all 4 extremities. Extremities: No clubbing or cyanosis. No edema. Neurologic: Alert and oriented x 3.  EKG:  Atrial fib.  Nonspecific IVCD.  Rate well controlled.    ASSESSMENT AND PLAN:

## 2011-08-26 NOTE — Assessment & Plan Note (Signed)
Not well controlled.  Will add amlodipine at 5mg  per day to regimen.  He had edema with this before but now is on a diuretic.  Will see back in four weeks, and monitor BP.

## 2011-08-26 NOTE — Assessment & Plan Note (Signed)
Rate is well controlled on current medications.  Therefore will not change.

## 2011-09-10 ENCOUNTER — Encounter: Payer: Self-pay | Admitting: Cardiology

## 2011-09-22 ENCOUNTER — Ambulatory Visit (INDEPENDENT_AMBULATORY_CARE_PROVIDER_SITE_OTHER): Payer: Medicare Other | Admitting: Cardiology

## 2011-09-22 ENCOUNTER — Encounter: Payer: Self-pay | Admitting: Cardiology

## 2011-09-22 VITALS — BP 136/62 | HR 64 | Ht 71.0 in | Wt 183.0 lb

## 2011-09-22 DIAGNOSIS — I4891 Unspecified atrial fibrillation: Secondary | ICD-10-CM

## 2011-09-22 DIAGNOSIS — I10 Essential (primary) hypertension: Secondary | ICD-10-CM

## 2011-09-22 DIAGNOSIS — E785 Hyperlipidemia, unspecified: Secondary | ICD-10-CM

## 2011-09-22 NOTE — Progress Notes (Signed)
HPI:  He seems to be doing quite a bit better. He turns his stimulator on, and his pain is significantly reduced. Overall, he feels that he has improved with the stimulator he also thinks that the amlodipine this helped quite a bit with his blood pressure. He brought in lab these, and these are being applied to the electronic medical record. He denies any chest pain or other symptoms.  Current Outpatient Prescriptions  Medication Sig Dispense Refill  . amLODipine (NORVASC) 5 MG tablet Take 1 tablet (5 mg total) by mouth daily.  30 tablet  11  . b complex vitamins tablet Take 1 tablet by mouth daily.        . Calcium 600 MG tablet Take 600 mg by mouth. Every other day       . carvedilol (COREG) 12.5 MG tablet Take 1 tablet (12.5 mg total) by mouth 2 (two) times daily with a meal.  90 tablet  3  . cholecalciferol (VITAMIN D) 1000 UNITS tablet Take 1,000 Units by mouth daily.        . Ferrous Sulfate (IRON) 325 (65 FE) MG TABS Take 1 tablet by mouth daily.        Providence Lanius CAPS Take 1 capsule by mouth daily.      Marland Kitchen losartan-hydrochlorothiazide (HYZAAR) 100-12.5 MG per tablet Take 1 tablet by mouth daily.        Marland Kitchen omeprazole (PRILOSEC) 20 MG capsule Take 20 mg by mouth daily.        Marland Kitchen oxyCODONE-acetaminophen (PERCOCET) 7.5-325 MG per tablet Take 1 tablet by mouth every 4 (four) hours as needed.        . potassium chloride (KLOR-CON 10) 10 MEQ tablet Take 1 tablet (10 mEq total) by mouth daily.  30 tablet  6  . pravastatin (PRAVACHOL) 10 MG tablet Take 10 mg by mouth daily.        . vitamin E 400 UNIT capsule Take 400 Units by mouth daily.        Marland Kitchen warfarin (COUMADIN) 5 MG tablet Take 1 tablet (5 mg total) by mouth as directed. Take as directed by Coumadin clinic  40 tablet  3    Allergies  Allergen Reactions  . Horse-Derived Products     Past Medical History  Diagnosis Date  . Personal history of other diseases of circulatory system   . Unspecified essential hypertension   . Anxiety  state, unspecified     past at death of spouse  . Depressive disorder, not elsewhere classified     past at death of spouse  . Other and unspecified hyperlipidemia   . Dyskinesia of esophagus   . Encounter for long-term (current) use of anticoagulants   . Hiatal hernia   . Duodenitis without mention of hemorrhage   . Unspecified gastritis and gastroduodenitis without mention of hemorrhage   . Unspecified hemorrhoids without mention of complication   . Diverticulosis of colon (without mention of hemorrhage)   . Barrett's esophagus   . Irritable bowel syndrome   . Unspecified sleep apnea   . Anemia   . Cardiac arrhythmia due to congenital heart disease   . Atrial fibrillation   . Skin cancer (melanoma)   . Status post dilation of esophageal narrowing   . GERD (gastroesophageal reflux disease)     Past Surgical History  Procedure Date  . Nissen fundoplication 1982  . Turp vaporization 1980s  . Carpal tunnel release 2003    bilateral  . Appendectomy  1945  . Tonsillectomy   . Vasectomy   . Lumbar laminectomy   . Small bowel obsturction     x4  . Cardiac catheterization 1991  . Central decompressive laminectomy     L2-L5    Family History  Problem Relation Age of Onset  . Stroke Mother   . Coronary artery disease Father     History   Social History  . Marital Status: Widowed    Spouse Name: N/A    Number of Children: 1  . Years of Education: N/A   Occupational History  . retired    Social History Main Topics  . Smoking status: Former Smoker    Quit date: 03/31/1972  . Smokeless tobacco: Never Used  . Alcohol Use: Yes     ocass  . Drug Use: No  . Sexually Active: Not on file   Other Topics Concern  . Not on file   Social History Narrative   Alcohol use- yes occasional    ROS: Please see the HPI.  All other systems reviewed and negative.  PHYSICAL EXAM:  BP 136/62  Pulse 64  Ht 5\' 11"  (1.803 m)  Wt 183 lb (83.008 kg)  BMI 25.52  kg/m2  General: Well developed, well nourished, in no acute distress. Head:  Normocephalic and atraumatic. Neck: no JVD Lungs: Clear to auscultation and percussion. Heart: irregularly irregular.   Pulses: Pulses normal in all 4 extremities. Extremities: No clubbing or cyanosis. No edema  (Checked without change from before Amlodipine.   Neurologic: Alert and oriented x 3.  EKG: Atrial fib controlled ventricular response.   ASSESSMENT AND PLAN:

## 2011-09-22 NOTE — Assessment & Plan Note (Signed)
On low dose pravastatin.  

## 2011-09-22 NOTE — Assessment & Plan Note (Signed)
These are clearly controlled.  I encouraged him to have labs when he sees his primary MD next month.

## 2011-09-22 NOTE — Patient Instructions (Signed)
Your physician recommends that you schedule a follow-up appointment in: 2 MONTHS  Your physician recommends that you continue on your current medications as directed. Please refer to the Current Medication list given to you today.   

## 2011-09-22 NOTE — Assessment & Plan Note (Signed)
Currently rate controlled.  On chronic warfarin.

## 2011-09-29 ENCOUNTER — Ambulatory Visit (INDEPENDENT_AMBULATORY_CARE_PROVIDER_SITE_OTHER): Payer: Medicare Other | Admitting: *Deleted

## 2011-09-29 DIAGNOSIS — Z7901 Long term (current) use of anticoagulants: Secondary | ICD-10-CM

## 2011-09-29 DIAGNOSIS — I4891 Unspecified atrial fibrillation: Secondary | ICD-10-CM

## 2011-10-13 ENCOUNTER — Encounter: Payer: Self-pay | Admitting: Internal Medicine

## 2011-10-13 ENCOUNTER — Ambulatory Visit (INDEPENDENT_AMBULATORY_CARE_PROVIDER_SITE_OTHER): Payer: Medicare Other | Admitting: Internal Medicine

## 2011-10-13 ENCOUNTER — Other Ambulatory Visit (INDEPENDENT_AMBULATORY_CARE_PROVIDER_SITE_OTHER): Payer: Medicare Other

## 2011-10-13 VITALS — BP 154/82 | HR 76 | Ht 71.5 in | Wt 182.6 lb

## 2011-10-13 DIAGNOSIS — D649 Anemia, unspecified: Secondary | ICD-10-CM

## 2011-10-13 DIAGNOSIS — K219 Gastro-esophageal reflux disease without esophagitis: Secondary | ICD-10-CM

## 2011-10-13 DIAGNOSIS — K6289 Other specified diseases of anus and rectum: Secondary | ICD-10-CM

## 2011-10-13 DIAGNOSIS — R1310 Dysphagia, unspecified: Secondary | ICD-10-CM

## 2011-10-13 DIAGNOSIS — R109 Unspecified abdominal pain: Secondary | ICD-10-CM

## 2011-10-13 DIAGNOSIS — R198 Other specified symptoms and signs involving the digestive system and abdomen: Secondary | ICD-10-CM

## 2011-10-13 DIAGNOSIS — R1030 Lower abdominal pain, unspecified: Secondary | ICD-10-CM

## 2011-10-13 LAB — CBC WITH DIFFERENTIAL/PLATELET
Basophils Absolute: 0 10*3/uL (ref 0.0–0.1)
Basophils Relative: 0.5 % (ref 0.0–3.0)
Eosinophils Absolute: 0.1 10*3/uL (ref 0.0–0.7)
Lymphocytes Relative: 21 % (ref 12.0–46.0)
MCHC: 33.2 g/dL (ref 30.0–36.0)
MCV: 96.1 fl (ref 78.0–100.0)
Monocytes Absolute: 0.5 10*3/uL (ref 0.1–1.0)
Neutrophils Relative %: 67.9 % (ref 43.0–77.0)
RDW: 15.1 % — ABNORMAL HIGH (ref 11.5–14.6)

## 2011-10-13 NOTE — Progress Notes (Signed)
  Subjective:    Patient ID: Ian Sullivan, male    DOB: November 14, 1927, 78 y.o.   MRN: [id number]  HPI This elderly white man presents for followup of GERD and Barrett's esophagus. He has a lot of throat clearing as needed previously. He notes that sometimes when he goes to swallow pills or liquids or other foods he will have coughing and feels like he is almost aspirating.  He is also noted that when he urinates there is a clear mucus discharge from his rectum. He is having an increase in severity of chronic lower abdominal cramps which she said ever since his fundoplication procedure.  There is intermittent chest pain in the mid chest with or without eating.  Medications, allergies, past medical history, past surgical history, family history and social history are reviewed and updated in the EMR.  Review of Systems Chronic back pain, ambulates with a cane.    Objective:   Physical Exam Well-developed elderly white man in no acute distress. He moves slowly when lying on the exam table due to back pain. The abdomen is soft and nontender without organomegaly or mass. Doubt sounds are active. Rectal exam is performed, the anoderm looks normal. He is in the left lateral decubitus position. In the prostate bed is firm. There is a firm shelflike lesion in the right aspect of the prostate bed, in the region of 12 to 2:00 in the left lateral decubitus position. It is firm but somewhat mobile. An anoscopy exam is attempted but does not shed much light on this there are some hemorrhoids in the canal.     Assessment & Plan:   1. Rectal mass   2. GERD (gastroesophageal reflux disease)   3. Lower abdominal pain   4. Dysphagia  - esophageal dysmotility versus paryngeal problems. Question both.   5. Anemia   1. Several issues here today. 2. The first step will be a sigmoidoscopy to determine what this rectal masses. Probably rectal and not prostatic but am not sure.The risks and benefits as well as  alternatives of endoscopic procedure(s) have been discussed and reviewed. All questions answered. The patient agrees to proceed. It's okay to do the procedure on warfarin. No sedation is needed. 3. CBC and ferritin given his history of anemia 4. He may have some aspiration issues in a modified R. swallow might be needed. 5. He has known esophageal dysmotility, that may explain some of the pain he is having. He has lower thoracic spine stenosis which is probably too low, T9 her lower for his chest pain. Further evaluation of these problems pending what turns up on the sigmoidoscopy.   Lab Results  Component Value Date   WBC 5.1 10/13/2011   HGB 14.4 10/13/2011   HCT 43.5 10/13/2011   MCV 96.1 10/13/2011   PLT 225.0 10/13/2011   CC: Ralene Ok, MD

## 2011-10-13 NOTE — Patient Instructions (Addendum)
Your physician has requested that you go to the basement for the following lab work before leaving today: CBC, Ferritin  You have been scheduled for a flexible sigmoidoscopy. Please follow the written instructions given to you at your visit today. If you use inhalers (even only as needed), please bring them with you on the day of your procedure.   Thank you for choosing [facility] today.

## 2011-10-14 ENCOUNTER — Ambulatory Visit (AMBULATORY_SURGERY_CENTER): Payer: Medicare Other | Admitting: Internal Medicine

## 2011-10-14 ENCOUNTER — Encounter: Payer: Self-pay | Admitting: Internal Medicine

## 2011-10-14 ENCOUNTER — Telehealth: Payer: Self-pay

## 2011-10-14 VITALS — BP 142/83 | HR 64 | Temp 98.1°F | Resp 16 | Ht 71.5 in | Wt 182.0 lb

## 2011-10-14 DIAGNOSIS — R198 Other specified symptoms and signs involving the digestive system and abdomen: Secondary | ICD-10-CM

## 2011-10-14 DIAGNOSIS — R1310 Dysphagia, unspecified: Secondary | ICD-10-CM

## 2011-10-14 DIAGNOSIS — K5730 Diverticulosis of large intestine without perforation or abscess without bleeding: Secondary | ICD-10-CM

## 2011-10-14 DIAGNOSIS — N402 Nodular prostate without lower urinary tract symptoms: Secondary | ICD-10-CM

## 2011-10-14 NOTE — Progress Notes (Signed)
Patient did not experience any of the following events: a burn prior to discharge; a fall within the facility; wrong site/side/patient/procedure/implant event; or a hospital transfer or hospital admission upon discharge from the facility. (G8907) Patient did not have preoperative order for IV antibiotic SSI prophylaxis. (G8918)  

## 2011-10-14 NOTE — Telephone Encounter (Signed)
Patient advised of referral to Dr. Vernie Ammons at [facility] for prostate nodule 10/22/11 10:45, and MBS scheduled for 10/16/11 11:00 at [facility].  He is advised to arrive at 10:45 and register in 1st floor radiology

## 2011-10-14 NOTE — Op Note (Signed)
[facility] [address] [place], [place]  [address]  FLEXIBLE SIGMOIDOSCOPY PROCEDURE REPORT  PATIENT:  Ian Sullivan, Ian Sullivan  MR#:  [id number] BIRTHDATE:  04-Feb-1928, 84 yrs. old  GENDER:  male  ENDOSCOPIST:  Iva Boop, MD, [facility]  PROCEDURE DATE:  10/14/2011 PROCEDURE:  Flexible Sigmoidoscopy, diagnostic ASA CLASS:  Class III INDICATIONS:  rectal mass  MEDICATIONS:   none  DESCRIPTION OF PROCEDURE:   After the risks benefits and alternatives of the procedure were thoroughly explained, informed consent was obtained.  Digital rectal exam was performed and revealed rectal mass.  Shelf-like, firm lesion at 12-3. The [id number] endoscope was introduced through the anus and advanced to the sigmoid colon, without limitations.  The quality of the prep was excellent.  The instrument was then slowly withdrawn as the mucosa was fully examined. <<PROCEDUREIMAGES>>  Mild diverticulosis was found in the sigmoid colon.   Retroflexed views in the rectum revealed mass.  Submucosal rectal bulge consistent with palpable mass.  The scope was then withdrawn from the patient and the procedure terminated.  COMPLICATIONS:  None  ENDOSCOPIC IMPRESSION: 1) Mild diverticulosis in the sigmoid colon 2) Mass in rectum - submucosal from prostate RECOMMENDATIONS: 1) Urology referral - thinks he has seen Dr. Vernie Ammons in past. 2) Modified barium swallow to evaluate ? of pharyngeal dysphagia   Iva Boop, MD, [facility]  CC:  Ralene Ok MD and The Patient  n. Rosalie Doctor:   Iva Boop at 10/14/2011 11:54 AM  Corwin Levins, [phone]

## 2011-10-14 NOTE — Patient Instructions (Addendum)
The prostate is abnormally enlarged. We will refer you to a urologist for further evaluation.  My office will also arrange for a test of your swallowing called a modified barium swallow.  We will notify you about the appointments.   Iva Boop, MD, FACG YOU HAD AN ENDOSCOPIC PROCEDURE TODAY AT THE [facility]: Refer to the procedure report that was given to you for any specific questions about what was found during the examination.  If the procedure report does not answer your questions, please call your gastroenterologist to clarify.  If you requested that your care partner not be given the details of your procedure findings, then the procedure report has been included in a sealed envelope for you to review at your convenience later.  YOU SHOULD EXPECT: Some feelings of bloating in the abdomen. Passage of more gas than usual.  Walking can help get rid of the air that was put into your GI tract during the procedure and reduce the bloating. If you had a lower endoscopy (such as a colonoscopy or flexible sigmoidoscopy) you may notice spotting of blood in your stool or on the toilet paper. If you underwent a bowel prep for your procedure, then you may not have a normal bowel movement for a few days.  DIET: Your first meal following the procedure should be a light meal and then it is ok to progress to your normal diet.  A half-sandwich or bowl of soup is an example of a good first meal.  Heavy or fried foods are harder to digest and may make you feel nauseous or bloated.  Likewise meals heavy in dairy and vegetables can cause extra gas to form and this can also increase the bloating.  Drink plenty of fluids but you should avoid alcoholic beverages for 24 hours.  ACTIVITY: Your care partner should take you home directly after the procedure.  You should plan to take it easy, moving slowly for the rest of the day.  You can resume normal activity the day after the procedure however you should  NOT DRIVE or use heavy machinery for 24 hours (because of the sedation medicines used during the test).    SYMPTOMS TO REPORT IMMEDIATELY: A gastroenterologist can be reached at any hour.  During normal business hours, 8:30 AM to 5:00 PM Monday through Friday, call [phone].  After hours and on weekends, please call the GI answering service at [phone] who will take a message and have the physician on call contact you.   Following lower endoscopy (colonoscopy or flexible sigmoidoscopy):  Excessive amounts of blood in the stool  Significant tenderness or worsening of abdominal pains  Swelling of the abdomen that is new, acute  Fever of 100F or higher  Following upper endoscopy (EGD)  Vomiting of blood or coffee ground material  New chest pain or pain under the shoulder blades  Painful or persistently difficult swallowing  New shortness of breath  Fever of 100F or higher  Black, tarry-looking stools  FOLLOW UP: If any biopsies were taken you will be contacted by phone or by letter within the next 1-3 weeks.  Call your gastroenterologist if you have not heard about the biopsies in 3 weeks.  Our staff will call the home number listed on your records the next business day following your procedure to check on you and address any questions or concerns that you may have at that time regarding the information given to you following your procedure. This is  a courtesy call and so if there is no answer at the home number and we have not heard from you through the emergency physician on call, we will assume that you have returned to your regular daily activities without incident.  SIGNATURES/CONFIDENTIALITY: You and/or your care partner have signed paperwork which will be entered into your electronic medical record.  These signatures attest to the fact that that the information above on your After Visit Summary has been reviewed and is understood.  Full responsibility of the confidentiality  of this discharge information lies with you and/or your care-partner.   Diverticulosis information sheet and high fiber diet information given

## 2011-10-15 ENCOUNTER — Telehealth: Payer: Self-pay | Admitting: *Deleted

## 2011-10-15 NOTE — Telephone Encounter (Signed)
  Follow up Call-  Call back number 10/14/2011  Post procedure Call Back phone  # [phone]  Permission to leave phone message Yes     Patient questions:  Do you have a fever, pain , or abdominal swelling? no Pain Score  0 *  Have you tolerated food without any problems? yes  Have you been able to return to your normal activities? yes  Do you have any questions about your discharge instructions: Diet   no Medications  no Follow up visit  no  Do you have questions or concerns about your Care? no  Actions: * If pain score is 4 or above: No action needed, pain <4.

## 2011-10-16 ENCOUNTER — Ambulatory Visit (HOSPITAL_COMMUNITY)
Admission: RE | Admit: 2011-10-16 | Discharge: 2011-10-16 | Disposition: A | Discharge status: Home / Self Care | Payer: Medicare Other | Source: Ambulatory Visit | Attending: Internal Medicine | Admitting: Internal Medicine

## 2011-10-16 DIAGNOSIS — R1310 Dysphagia, unspecified: Secondary | ICD-10-CM

## 2011-10-16 DIAGNOSIS — R1313 Dysphagia, pharyngeal phase: Secondary | ICD-10-CM | POA: Insufficient documentation

## 2011-10-16 NOTE — Procedures (Signed)
Objective Swallowing Evaluation: Modified Barium Swallowing Study  Patient Details  Name: Ian Sullivan MRN: [id number] Date of Birth: Mar 12, 1928  Today's Date: 10/16/2011 Time: 1100-1130 SLP Time Calculation (min): 30 min  Past Medical History:  Past Medical History  Diagnosis Date  . Personal history of other diseases of circulatory system   . Unspecified essential hypertension   . Anxiety state, unspecified     past at death of spouse  . Depressive disorder, not elsewhere classified     past at death of spouse  . Other and unspecified hyperlipidemia   . Dyskinesia of esophagus   . Encounter for long-term (current) use of anticoagulants   . Hiatal hernia   . Duodenitis without mention of hemorrhage   . Unspecified gastritis and gastroduodenitis without mention of hemorrhage   . Unspecified hemorrhoids without mention of complication   . Diverticulosis of colon (without mention of hemorrhage)   . Barrett's esophagus   . Irritable bowel syndrome   . Unspecified sleep apnea   . Anemia   . Cardiac arrhythmia due to congenital heart disease   . Atrial fibrillation   . Skin cancer (melanoma)   . Status post dilation of esophageal narrowing   . GERD (gastroesophageal reflux disease)    Past Surgical History:  Past Surgical History  Procedure Date  . Nissen fundoplication 1982  . Turp vaporization 1980s  . Carpal tunnel release 2003    bilateral  . Appendectomy 1945  . Tonsillectomy   . Vasectomy   . Lumbar laminectomy   . Small bowel obsturction     x4  . Cardiac catheterization 1991  . Central decompressive laminectomy     L2-L5  . Esophagogastroduodenoscopy   . Colonoscopy   . Spinal cord stimulator implant 06/2011    Dr. Arlan Organ   HPI:  Pt is an 78 year old male arriving due to complaints of pills getting stuck in his throat. Pt with MBS in 2008, report not available. Pt states he was told he has a pocket where things get stuck. He says he has an injury to his  CN 11 in childhood. 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 Pt reports reflux is much resolved since then. He denies pna.      Assessment / Plan / Recommendation Clinical Impression  Clinical impression: Pt presents with a mild pharyngeal dysphagia with sensory motor deficits evident only with large boluses. Observed to have mild aspiration with large consecutive straw sips of thin liquids before the swallow with delayed cough. With single cups sips the pt exibited early and adequate airway protection without penetration or aspiration. Pt also with mild vallecular residuals post swallow due to mildly decreased base of tongue stength, laryngeal elvation and epiglottic deflection A chin tuck does not improve stasis, but cues for an effortful swallow do decrease residue. The pt is safe to continue a regular diet with thin liquids with aspiraiton precautions. Suggest pt visit an outpatient SLP clinic for base of tongue/pharyngeal strengthening excercises to improve strength.     Treatment Recommendation       Diet Recommendation Regular;Thin liquid   Liquid Administration via: Cup;No straw Medication Administration: Whole meds with liquid    Other  Recommendations     Follow Up Recommendations  Outpatient SLP    Frequency and Duration        Pertinent Vitals/Pain NA    SLP Swallow Goals     General HPI: Pt is  an 78 year old male arriving due to complaints of pills getting stuck in his throat. Pt with MBS in 2008, report not available. Pt states he was told he has a pocket where things get stuck. He says he has an injury to his CN 11 in childhood. 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 Pt reports reflux is much resolved since then. He denies pna.  Type of Study: Modified Barium Swallowing Study Reason for Referral: Objectively evaluate swallowing function Previous Swallow  Assessment: MBS 2008 Diet Prior to this Study: Regular;Thin liquids Respiratory Status: Room air History of Recent Intubation: No Behavior/Cognition: Alert;Cooperative;Pleasant mood Oral Cavity - Dentition: Adequate natural dentition Oral Motor / Sensory Function: Within functional limits Self-Feeding Abilities: Able to feed self Patient Positioning: Upright in chair Baseline Vocal Quality: Hoarse Volitional Cough: Strong Volitional Swallow: Able to elicit Anatomy: Within functional limits    Reason for Referral Objectively evaluate swallowing function   Oral Phase     Pharyngeal Phase Pharyngeal Phase: Impaired   Cervical Esophageal Phase Cervical Esophageal Phase: [facility]    Jody Aguinaga, Riley Nearing 10/16/2011, 12:32 PM

## 2011-10-22 ENCOUNTER — Other Ambulatory Visit: Payer: Self-pay | Admitting: Cardiology

## 2011-10-24 ENCOUNTER — Encounter: Payer: Self-pay | Admitting: Internal Medicine

## 2011-10-24 ENCOUNTER — Other Ambulatory Visit: Payer: Self-pay | Admitting: *Deleted

## 2011-10-24 DIAGNOSIS — N4289 Other specified disorders of prostate: Secondary | ICD-10-CM | POA: Insufficient documentation

## 2011-10-24 DIAGNOSIS — R1310 Dysphagia, unspecified: Secondary | ICD-10-CM

## 2011-10-24 DIAGNOSIS — R1313 Dysphagia, pharyngeal phase: Secondary | ICD-10-CM | POA: Insufficient documentation

## 2011-10-24 NOTE — Progress Notes (Signed)
Quick Note:  Please see that copied Ass/Plan  He needs to have outpatient speech path ordered to treat dysphagia.   ______

## 2011-10-24 NOTE — Progress Notes (Signed)
Quick Note:  Clinical Impression   Clinical impression: Pt presents with a mild pharyngeal dysphagia with sensory motor deficits evident only with large boluses. Observed to have mild aspiration with large consecutive straw sips of thin liquids before the swallow with delayed cough. With single cups sips the pt exibited early and adequate airway protection without penetration or aspiration. Pt also with mild vallecular residuals post swallow due to mildly decreased base of tongue stength, laryngeal elvation and epiglottic deflection A chin tuck does not improve stasis, but cues for an effortful swallow do decrease residue. The pt is safe to continue a regular diet with thin liquids with aspiraiton precautions. Suggest pt visit an outpatient SLP clinic for base of tongue/pharyngeal strengthening excercises to improve strength.   Treatment Recommendation    Diet Recommendation Regular;Thin liquid    Liquid Administration via: Cup;No straw Medication Administration: Whole meds with liquid   Other Recommendations  Follow Up Recommendations   Outpatient SLP   Frequency and Duration    Pertinent Vitals/Pain NA   ______

## 2011-10-30 ENCOUNTER — Telehealth: Payer: Self-pay | Admitting: Internal Medicine

## 2011-10-30 DIAGNOSIS — K6289 Other specified diseases of anus and rectum: Secondary | ICD-10-CM

## 2011-10-30 NOTE — Telephone Encounter (Signed)
I spoke to patient re: Dr. Vernie Ammons did not think submucosal rectal mass was from prostate.  A PSA is pending - we will call to get those results but most likely needs an EUS.  I told patient to call us back if he does not hear from [place] in 5-6 days.   Sheri - please get PSA results

## 2011-10-31 NOTE — Telephone Encounter (Signed)
Results are in your office.

## 2011-11-03 ENCOUNTER — Ambulatory Visit (INDEPENDENT_AMBULATORY_CARE_PROVIDER_SITE_OTHER): Payer: Medicare Other | Admitting: Pharmacist

## 2011-11-03 DIAGNOSIS — Z7901 Long term (current) use of anticoagulants: Secondary | ICD-10-CM

## 2011-11-03 DIAGNOSIS — I4891 Unspecified atrial fibrillation: Secondary | ICD-10-CM

## 2011-11-03 NOTE — Telephone Encounter (Signed)
PSA is ok  Will ask Dr. Christella Hartigan to review chart - a rectal EUS seems sensible to me if he agrees

## 2011-11-04 NOTE — Telephone Encounter (Signed)
Lower EUS is a good idea.   Ian Sullivan, can you set him up with lower EUS, radial +/- linear, at [facility] next available EUS time.  For rectal mass.  Thanks

## 2011-11-05 ENCOUNTER — Other Ambulatory Visit: Payer: Self-pay

## 2011-11-05 DIAGNOSIS — K6289 Other specified diseases of anus and rectum: Secondary | ICD-10-CM

## 2011-11-05 NOTE — Telephone Encounter (Signed)
Left message on machine to call back  

## 2011-11-06 NOTE — Telephone Encounter (Signed)
  11/06/2011    RE: LOTUS GOVER DOB: [birth year] MRN: [id number]   Dear Dr Riley Kill,    We have scheduled the above patient for an endoscopic procedure with Dr Christella Hartigan. Our records show that he is on anticoagulation therapy.   Please advise as to how long the patient may come off his therapy of Coumadin prior to the procedure, which is scheduled for 11/13/11.  Please fax back/ or route the completed form to Paisely Brick  at [phone].   Sincerely,  Chales Abrahams CMA AAMA  Please respond by 11/07/11 and route back to [facility]

## 2011-11-06 NOTE — Telephone Encounter (Signed)
Pt is on coumadin for A fib, should he hold it?  Dr Riley Kill manages.  Also the pt wants to notify Dr Christella Hartigan that he a spinal cord stimulator.  Please advise

## 2011-11-06 NOTE — Telephone Encounter (Signed)
Might need to perform FNA and so I think he needs to hold coumadin for 5 days IF it is Ok with Dr. Riley Kill.  Spinal stimulator should not be a problem

## 2011-11-07 ENCOUNTER — Telehealth: Payer: Self-pay

## 2011-11-07 NOTE — Telephone Encounter (Signed)
Patty from [facility] called stating patient is scheduled for endo u/s with possible biopsies next week and needs to know if ok for patient to hold coumadin.Dr.Stuckey not in office today.Spoke to DOD Dr.Wall he advised ok to hold coumadin.

## 2011-11-07 NOTE — Telephone Encounter (Signed)
This would be appropriate to do and would be important to determine.

## 2011-11-07 NOTE — Telephone Encounter (Signed)
Dr Daleen Squibb ok'd 5 day hold on Coumadin.     Neoma Laming, LPN 04/05/1094 04:54 PM Signed  Alexia Freestone from [facility] called stating patient is scheduled for endo u/s with possible biopsies next week and needs to know if ok for patient to hold coumadin.Dr.Stuckey not in office today.Spoke to DOD Dr.Wall he advised ok to hold coumadin.  Pt has been notified and instructed

## 2011-11-13 ENCOUNTER — Encounter (HOSPITAL_COMMUNITY): Payer: Self-pay | Admitting: *Deleted

## 2011-11-13 ENCOUNTER — Encounter (HOSPITAL_COMMUNITY)
Admission: RE | Disposition: A | Discharge status: Home / Self Care | Payer: Self-pay | Source: Ambulatory Visit | Attending: Gastroenterology

## 2011-11-13 ENCOUNTER — Encounter (HOSPITAL_COMMUNITY): Payer: Self-pay

## 2011-11-13 ENCOUNTER — Ambulatory Visit (HOSPITAL_COMMUNITY)
Admission: RE | Admit: 2011-11-13 | Discharge: 2011-11-13 | Disposition: A | Discharge status: Home / Self Care | Payer: Medicare Other | Source: Ambulatory Visit | Attending: Gastroenterology | Admitting: Gastroenterology

## 2011-11-13 DIAGNOSIS — D1779 Benign lipomatous neoplasm of other sites: Secondary | ICD-10-CM | POA: Insufficient documentation

## 2011-11-13 DIAGNOSIS — K6289 Other specified diseases of anus and rectum: Secondary | ICD-10-CM

## 2011-11-13 DIAGNOSIS — I4891 Unspecified atrial fibrillation: Secondary | ICD-10-CM | POA: Insufficient documentation

## 2011-11-13 DIAGNOSIS — K6389 Other specified diseases of intestine: Secondary | ICD-10-CM | POA: Insufficient documentation

## 2011-11-13 DIAGNOSIS — R198 Other specified symptoms and signs involving the digestive system and abdomen: Secondary | ICD-10-CM

## 2011-11-13 DIAGNOSIS — I10 Essential (primary) hypertension: Secondary | ICD-10-CM | POA: Insufficient documentation

## 2011-11-13 DIAGNOSIS — R933 Abnormal findings on diagnostic imaging of other parts of digestive tract: Secondary | ICD-10-CM

## 2011-11-13 DIAGNOSIS — E785 Hyperlipidemia, unspecified: Secondary | ICD-10-CM | POA: Insufficient documentation

## 2011-11-13 DIAGNOSIS — K219 Gastro-esophageal reflux disease without esophagitis: Secondary | ICD-10-CM | POA: Insufficient documentation

## 2011-11-13 HISTORY — PX: EUS: SHX5427

## 2011-11-13 SURGERY — ULTRASOUND, LOWER GI TRACT, ENDOSCOPIC
Anesthesia: Moderate Sedation

## 2011-11-13 MED ORDER — SODIUM CHLORIDE 0.9 % IV SOLN
INTRAVENOUS | Status: DC
  Administered 2011-11-13: 500 mL via INTRAVENOUS

## 2011-11-13 MED ORDER — FENTANYL CITRATE 0.05 MG/ML IJ SOLN
INTRAMUSCULAR | Status: DC | PRN
  Administered 2011-11-13: 25 ug via INTRAVENOUS

## 2011-11-13 MED ORDER — MIDAZOLAM HCL 10 MG/2ML IJ SOLN
INTRAMUSCULAR | Status: DC | PRN
  Administered 2011-11-13: 2 mg via INTRAVENOUS

## 2011-11-13 MED ORDER — MIDAZOLAM HCL 10 MG/2ML IJ SOLN
INTRAMUSCULAR | Status: AC
  Filled 2011-11-13: qty 2

## 2011-11-13 MED ORDER — FENTANYL CITRATE 0.05 MG/ML IJ SOLN
INTRAMUSCULAR | Status: AC
  Filled 2011-11-13: qty 2

## 2011-11-13 NOTE — Op Note (Signed)
[facility] [address] [place], [place]  [address]  OPERATIVE PROCEDURE REPORT  PATIENT:  Ian Sullivan, Ian Sullivan  MR#:  [id number] BIRTHDATE:  [birth year]  GENDER:  male ENDOSCOPIST:  Rachael Fee, MD ASSISTANT:  Janae Sauce, RN and Kandice Robinsons PROCEDURE DATE:  11/13/2011 PROCEDURE:  Endorectal ultrasound ASA CLASS:  Class III INDICATIONS:  palpable submucosal lesion in rectum MEDICATIONS:   Fentanyl 25 mcg IV, Versed 2 mg IV DESCRIPTION OF PROCEDURE:   After the risks benefits and alternatives of the procedure were thoroughly explained, informed consent was obtained.  Throughout the procedure, the patient's blood pressure, pulse and oxygen saturations were monitored continuously.  Under direct visualization, the 110037 endoscope was introduced through the anus and advanced to the sigmoid colon. Water was used as necessary to provide an acoustic interface. Imaging was obtained at 7.5 and . Upon completion of the imaging, water was removed and the patient was sent to the recovery room in satisfactory condition.  <<PROCEDUREIMAGES>>  Sigmoidoscopic findings: 1. Approximately 1cm subepithelial bulge in distal, right side rectum. Normal overlying mucosa  EUS findings: 1. The lesion above corresponds with a 1.7cm iso to hyperechoic, ill-defined lesion in the mucosa, deep mucosal layer of rectal wall. 2. No perirectal adenopathy.  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 This corresponds to a similarly described lesion on 2008 CT colonography that was done following incomplete optical colonoscopy.  There has been no significant interval growth and I think this does not need further evaluation.  ______________________________ Rachael Fee, MD  cc: Stan Head, MD  n. eSIGNED:   Rachael Fee at 11/13/2011 02:08 PM  Corwin Levins, [phone]

## 2011-11-13 NOTE — H&P (Signed)
  HPI: This is a man with palpable rectal lesion, submucosal on recent flex sig    Past Medical History  Diagnosis Date  . Personal history of other diseases of circulatory system   . Unspecified essential hypertension   . Anxiety state, unspecified     past at death of spouse  . Depressive disorder, not elsewhere classified     past at death of spouse  . Other and unspecified hyperlipidemia   . Dyskinesia of esophagus   . Encounter for long-term (current) use of anticoagulants   . Hiatal hernia   . Duodenitis without mention of hemorrhage   . Unspecified gastritis and gastroduodenitis without mention of hemorrhage   . Unspecified hemorrhoids without mention of complication   . Diverticulosis of colon (without mention of hemorrhage)   . Barrett's esophagus   . Irritable bowel syndrome   . Unspecified sleep apnea   . Anemia   . Cardiac arrhythmia due to congenital heart disease   . Atrial fibrillation   . Skin cancer (melanoma)   . Status post dilation of esophageal narrowing   . GERD (gastroesophageal reflux disease)     Past Surgical History  Procedure Date  . Nissen fundoplication 1982  . Turp vaporization 1980s  . Carpal tunnel release 2003    bilateral  . Appendectomy 1945  . Tonsillectomy   . Vasectomy   . Lumbar laminectomy   . Small bowel obsturction     x4  . Cardiac catheterization 1991  . Central decompressive laminectomy     L2-L5  . Esophagogastroduodenoscopy   . Colonoscopy   . Spinal cord stimulator implant 06/2011    Dr. Arlan Organ    Current Facility-Administered Medications  Medication Dose Route Frequency Provider Last Rate Last Dose  . 0.9 %  sodium chloride infusion   Intravenous Continuous Rachael Fee, MD 20 mL/hr at 11/13/11 1251 500 mL at 11/13/11 1251    Allergies as of 11/05/2011 - Review Complete 10/14/2011  Allergen Reaction Noted  . Horse-derived products  07/11/2008    Family History  Problem Relation Age of Onset  . Stroke  Mother   . Coronary artery disease Father   . Colon cancer Neg Hx     History   Social History  . Marital Status: Widowed    Spouse Name: N/A    Number of Children: 1  . Years of Education: N/A   Occupational History  . retired    Social History Main Topics  . Smoking status: Former Smoker    Quit date: 03/31/1972  . Smokeless tobacco: Never Used  . Alcohol Use: 0.6 oz/week    1 Shots of liquor per week     ocass  . Drug Use: No  . Sexually Active: Not on file   Other Topics Concern  . Not on file   Social History Narrative   Alcohol use- yes occasional      Physical Exam: BP 156/72  Temp 97.6 F (36.4 C) (Oral)  Resp 18  Ht 5' 11.5" (1.816 m)  Wt 180 lb (81.647 kg)  BMI 24.75 kg/m2  SpO2 95% Constitutional: generally well-appearing Psychiatric: alert and oriented x3 Abdomen: soft, nontender, nondistended, no obvious ascites, no peritoneal signs, normal bowel sounds     Assessment and plan: 78 y.o. male with submucosal rectal lesion  For lower EUS today

## 2011-11-14 ENCOUNTER — Encounter (HOSPITAL_COMMUNITY): Payer: Self-pay | Admitting: Gastroenterology

## 2011-11-17 ENCOUNTER — Ambulatory Visit: Payer: Medicare Other | Attending: Internal Medicine | Admitting: Speech Pathology

## 2011-11-17 DIAGNOSIS — R1310 Dysphagia, unspecified: Secondary | ICD-10-CM | POA: Insufficient documentation

## 2011-11-17 DIAGNOSIS — IMO0001 Reserved for inherently not codable concepts without codable children: Secondary | ICD-10-CM | POA: Insufficient documentation

## 2011-11-26 ENCOUNTER — Other Ambulatory Visit: Payer: Self-pay | Admitting: Dermatology

## 2011-11-28 ENCOUNTER — Ambulatory Visit: Payer: Medicare Other | Admitting: Cardiology

## 2011-12-05 ENCOUNTER — Ambulatory Visit (INDEPENDENT_AMBULATORY_CARE_PROVIDER_SITE_OTHER): Payer: Medicare Other | Admitting: Cardiology

## 2011-12-05 ENCOUNTER — Encounter: Payer: Self-pay | Admitting: Cardiology

## 2011-12-05 VITALS — BP 142/74 | HR 71 | Ht 71.5 in | Wt 188.0 lb

## 2011-12-05 DIAGNOSIS — I10 Essential (primary) hypertension: Secondary | ICD-10-CM

## 2011-12-05 DIAGNOSIS — I4891 Unspecified atrial fibrillation: Secondary | ICD-10-CM

## 2011-12-05 NOTE — Patient Instructions (Addendum)
Your physician wants you to follow-up in: 4 MONTHS with Dr Stuckey.  You will receive a reminder letter in the mail two months in advance. If you don't receive a letter, please call our office to schedule the follow-up appointment.  Your physician recommends that you continue on your current medications as directed. Please refer to the Current Medication list given to you today.  

## 2011-12-05 NOTE — Progress Notes (Signed)
HPI:  Patient is in for followup. From a cardiac standpoint he is stable. He is really doing pretty well. He denies any ongoing chest pain. He has not been short of breath. His spinal cord stimulator has really helped he is currently undergoing neuro rehabilitation. His laboratory studies were recently done in the office of his primary care provider, and his LDL cholesterol was 83. His BUN and creatinine were normal and his potassium was 3.8 with a normal hematocrit.  Current Outpatient Prescriptions  Medication Sig Dispense Refill  . amLODipine (NORVASC) 5 MG tablet Take 1 tablet (5 mg total) by mouth daily.  30 tablet  11  . Calcium 600 MG tablet Take 600 mg by mouth. Every other day       . carvedilol (COREG) 12.5 MG tablet TAKE 1 TABLET BY MOUTH TWICE A DAY WITH MEAL  180 tablet  3  . cholecalciferol (VITAMIN D) 1000 UNITS tablet Take 1,000 Units by mouth daily.        . DULoxetine (CYMBALTA) 30 MG capsule Take 30 mg by mouth daily.      . Ferrous Sulfate (IRON) 325 (65 FE) MG TABS Take 1 tablet by mouth daily.        Marland Kitchen KLOR-CON M10 10 MEQ tablet       . Krill Oil CAPS Take 1 capsule by mouth daily.      Marland Kitchen losartan-hydrochlorothiazide (HYZAAR) 100-12.5 MG per tablet Take 1 tablet by mouth daily.        . Multiple Vitamin (MULTIVITAMIN) tablet Take 1 tablet by mouth daily.      Marland Kitchen omeprazole (PRILOSEC) 20 MG capsule Take 20 mg by mouth daily.        Marland Kitchen oxyCODONE-acetaminophen (PERCOCET) 7.5-325 MG per tablet Take 1 tablet by mouth every 4 (four) hours as needed.        . potassium chloride (KLOR-CON 10) 10 MEQ tablet Take 1 tablet (10 mEq total) by mouth daily.  30 tablet  6  . pravastatin (PRAVACHOL) 10 MG tablet Take 10 mg by mouth daily.        . vitamin E 400 UNIT capsule Take 400 Units by mouth daily.        Marland Kitchen warfarin (COUMADIN) 5 MG tablet Take 1 tablet (5 mg total) by mouth as directed. Take as directed by Coumadin clinic  40 tablet  3    Allergies  Allergen Reactions  .  Horse-Derived Products     Past Medical History  Diagnosis Date  . Personal history of other diseases of circulatory system   . Unspecified essential hypertension   . Anxiety state, unspecified     past at death of spouse  . Depressive disorder, not elsewhere classified     past at death of spouse  . Other and unspecified hyperlipidemia   . Dyskinesia of esophagus   . Encounter for long-term (current) use of anticoagulants   . Hiatal hernia   . Duodenitis without mention of hemorrhage   . Unspecified gastritis and gastroduodenitis without mention of hemorrhage   . Unspecified hemorrhoids without mention of complication   . Diverticulosis of colon (without mention of hemorrhage)   . Barrett's esophagus   . Irritable bowel syndrome   . Unspecified sleep apnea   . Anemia   . Cardiac arrhythmia due to congenital heart disease   . Atrial fibrillation   . Skin cancer (melanoma)   . Status post dilation of esophageal narrowing   . GERD (gastroesophageal reflux disease)  Past Surgical History  Procedure Date  . Nissen fundoplication 1982  . Turp vaporization 1980s  . Carpal tunnel release 2003    bilateral  . Appendectomy 1945  . Tonsillectomy   . Vasectomy   . Lumbar laminectomy   . Small bowel obsturction     x4  . Cardiac catheterization 1991  . Central decompressive laminectomy     L2-L5  . Esophagogastroduodenoscopy   . Colonoscopy   . Spinal cord stimulator implant 06/2011    Dr. Arlan Organ  . Eus 11/13/2011    Procedure: LOWER ENDOSCOPIC ULTRASOUND (EUS);  Surgeon: Rachael Fee, MD;  Location: [facility];  Service: Endoscopy;  Laterality: N/A;    Family History  Problem Relation Age of Onset  . Stroke Mother   . Coronary artery disease Father   . Colon cancer Neg Hx     History   Social History  . Marital Status: Widowed    Spouse Name: N/A    Number of Children: 1  . Years of Education: N/A   Occupational History  . retired    Social History  Main Topics  . Smoking status: Former Smoker    Quit date: 03/31/1972  . Smokeless tobacco: Never Used  . Alcohol Use: 0.6 oz/week    1 Shots of liquor per week     ocass  . Drug Use: No  . Sexually Active: Not on file   Other Topics Concern  . Not on file   Social History Narrative   Alcohol use- yes occasional    ROS: Please see the HPI.  All other systems reviewed and negative.  PHYSICAL EXAM:  BP 142/74  Pulse 71  Ht 5' 11.5" (1.816 m)  Wt 188 lb (85.276 kg)  BMI 25.86 kg/m2  General: Well developed, well nourished, in no acute distress. Head:  Normocephalic and atraumatic. Neck: no JVD Lungs: Clear to auscultation and percussion. Heart:irregularly irregular rhythm.  No murmur.   Pulses: Pulses normal in all 4 extremities. Extremities: No clubbing or cyanosis. Trace edema.   Neurologic: Alert and oriented x 3.  EKG:  Atrial fibrillation with controlled ventricular response.  RBBB.  No change from prior tracing.    ASSESSMENT AND PLAN:

## 2011-12-05 NOTE — Assessment & Plan Note (Signed)
The patient's rate is very well controlled at present time.

## 2011-12-05 NOTE — Assessment & Plan Note (Signed)
Patient brought a log of his blood pressures. They range in the 120s up in the mid 150s. However, they did not get too high and he generally are well controlled

## 2011-12-08 ENCOUNTER — Ambulatory Visit: Payer: Medicare Other | Attending: Internal Medicine | Admitting: Speech Pathology

## 2011-12-08 DIAGNOSIS — R1310 Dysphagia, unspecified: Secondary | ICD-10-CM | POA: Insufficient documentation

## 2011-12-08 DIAGNOSIS — IMO0001 Reserved for inherently not codable concepts without codable children: Secondary | ICD-10-CM | POA: Insufficient documentation

## 2011-12-15 ENCOUNTER — Ambulatory Visit (INDEPENDENT_AMBULATORY_CARE_PROVIDER_SITE_OTHER): Payer: Medicare Other | Admitting: *Deleted

## 2011-12-15 ENCOUNTER — Ambulatory Visit: Payer: Medicare Other | Admitting: Speech Pathology

## 2011-12-15 DIAGNOSIS — I4891 Unspecified atrial fibrillation: Secondary | ICD-10-CM

## 2011-12-15 DIAGNOSIS — Z7901 Long term (current) use of anticoagulants: Secondary | ICD-10-CM

## 2011-12-15 LAB — POCT INR: INR: 2.2

## 2011-12-17 ENCOUNTER — Ambulatory Visit: Payer: Medicare Other | Admitting: Speech Pathology

## 2011-12-22 ENCOUNTER — Ambulatory Visit: Payer: Medicare Other | Admitting: Speech Pathology

## 2011-12-24 ENCOUNTER — Ambulatory Visit: Payer: Medicare Other | Admitting: Speech Pathology

## 2011-12-24 ENCOUNTER — Encounter: Payer: Medicare Other | Admitting: Speech Pathology

## 2011-12-29 ENCOUNTER — Ambulatory Visit: Payer: Medicare Other | Admitting: Speech Pathology

## 2011-12-31 ENCOUNTER — Encounter: Payer: Medicare Other | Admitting: Speech Pathology

## 2012-01-05 ENCOUNTER — Encounter: Payer: Medicare Other | Admitting: Speech Pathology

## 2012-01-07 ENCOUNTER — Encounter: Payer: Medicare Other | Admitting: Speech Pathology

## 2012-01-15 ENCOUNTER — Other Ambulatory Visit: Payer: Self-pay | Admitting: Cardiology

## 2012-01-26 ENCOUNTER — Ambulatory Visit (INDEPENDENT_AMBULATORY_CARE_PROVIDER_SITE_OTHER): Payer: Medicare Other

## 2012-01-26 DIAGNOSIS — Z7901 Long term (current) use of anticoagulants: Secondary | ICD-10-CM

## 2012-01-26 DIAGNOSIS — I4891 Unspecified atrial fibrillation: Secondary | ICD-10-CM

## 2012-01-26 LAB — POCT INR: INR: 1.9

## 2012-02-16 ENCOUNTER — Telehealth: Payer: Self-pay | Admitting: Cardiology

## 2012-02-16 NOTE — Telephone Encounter (Signed)
plz return call to pt [phone] regarding swelling of the feet.

## 2012-02-16 NOTE — Telephone Encounter (Signed)
I spoke with the Ian Sullivan and he wanted to let Dr Riley Kill know that he is having some swelling in his feet and ankles.  The Ian Sullivan takes Amlodipine and I made him aware that this can be a side effect from the medication.  The Ian Sullivan does not elevate his feet whensitting and I made him aware that he should try and prop his feet up when able.  The Ian Sullivan also does not watch his salt intake.  The Ian Sullivan no longer cooks and relies on processed foods which are high in salt.  I made the Ian Sullivan aware that he needs to decrease his salt consumption in the diet.  The Ian Sullivan states that he will elevate his legs and try to decrease his salt intake to help with swelling.

## 2012-02-18 NOTE — Telephone Encounter (Signed)
Agree with the above strategy.  We need to recheck with him next week to see if this has improved.  If not, we should see him in follow up shortly.  TS

## 2012-02-23 NOTE — Telephone Encounter (Signed)
I spoke with the pt and he has noticed some improvement in his swelling since last week.  The pt said his swelling is worse at night time but in the morning it is better.  I also spoke with the pt about wearing support stockings.  The pt might try this if his swelling remains bothersome.  I instructed the pt to contact our office if his swelling worsens or develops new symptoms.  Pt agreed with plan.

## 2012-02-23 NOTE — Telephone Encounter (Signed)
Called to check on pt this week. Left message to call back.

## 2012-03-01 ENCOUNTER — Ambulatory Visit (INDEPENDENT_AMBULATORY_CARE_PROVIDER_SITE_OTHER): Payer: Medicare Other | Admitting: Pharmacist

## 2012-03-01 ENCOUNTER — Encounter: Payer: Self-pay | Admitting: Cardiovascular Disease

## 2012-03-01 DIAGNOSIS — I4891 Unspecified atrial fibrillation: Secondary | ICD-10-CM

## 2012-03-01 DIAGNOSIS — Z7901 Long term (current) use of anticoagulants: Secondary | ICD-10-CM

## 2012-03-01 LAB — POCT INR: INR: 1.7

## 2012-03-16 ENCOUNTER — Ambulatory Visit (INDEPENDENT_AMBULATORY_CARE_PROVIDER_SITE_OTHER): Payer: Medicare Other | Admitting: Pharmacist

## 2012-03-16 DIAGNOSIS — Z7901 Long term (current) use of anticoagulants: Secondary | ICD-10-CM

## 2012-03-16 DIAGNOSIS — I4891 Unspecified atrial fibrillation: Secondary | ICD-10-CM

## 2012-03-16 LAB — POCT INR: INR: 1.9

## 2012-04-05 ENCOUNTER — Encounter: Payer: Self-pay | Admitting: Cardiology

## 2012-04-05 ENCOUNTER — Ambulatory Visit (INDEPENDENT_AMBULATORY_CARE_PROVIDER_SITE_OTHER): Payer: Medicare Other | Admitting: Cardiology

## 2012-04-05 ENCOUNTER — Ambulatory Visit (INDEPENDENT_AMBULATORY_CARE_PROVIDER_SITE_OTHER): Payer: Medicare Other | Admitting: Pharmacist

## 2012-04-05 VITALS — BP 142/80 | HR 61 | Ht 71.5 in | Wt 189.0 lb

## 2012-04-05 DIAGNOSIS — I10 Essential (primary) hypertension: Secondary | ICD-10-CM

## 2012-04-05 DIAGNOSIS — I4891 Unspecified atrial fibrillation: Secondary | ICD-10-CM

## 2012-04-05 DIAGNOSIS — Z7901 Long term (current) use of anticoagulants: Secondary | ICD-10-CM

## 2012-04-05 DIAGNOSIS — I080 Rheumatic disorders of both mitral and aortic valves: Secondary | ICD-10-CM

## 2012-04-05 LAB — POCT INR: INR: 2

## 2012-04-05 NOTE — Assessment & Plan Note (Signed)
Controlled at present.  

## 2012-04-05 NOTE — Patient Instructions (Addendum)
Your physician recommends that you schedule a follow-up appointment in: 2 MONTHS with Dr Riley Kill  Your physician recommends that you continue on your current medications as directed. Please refer to the Current Medication list given to you today.

## 2012-04-05 NOTE — Assessment & Plan Note (Signed)
Stable at present.  Continue to monitor.

## 2012-04-05 NOTE — Progress Notes (Signed)
HPI:  Patient seen today in a followup visit. Overall, he is doing pretty well he thinks a stimulator works reasonably well. He's had his recent laboratory studies which we have been given. He denies any ongoing chest pain.  Current Outpatient Prescriptions  Medication Sig Dispense Refill  . amLODipine (NORVASC) 5 MG tablet Take 1 tablet (5 mg total) by mouth daily.  30 tablet  11  . Calcium 600 MG tablet Take 600 mg by mouth. Every other day       . carvedilol (COREG) 12.5 MG tablet TAKE 1 TABLET BY MOUTH TWICE A DAY WITH MEAL  180 tablet  3  . cholecalciferol (VITAMIN D) 1000 UNITS tablet Take 1,000 Units by mouth daily.        . Ferrous Sulfate (IRON) 325 (65 FE) MG TABS Take 1 tablet by mouth daily.        [place] Lan[facility] CAPS Take 1 capsule by mouth daily.      Marland Kitchen losartan-hydrochlorothiazide (HYZAAR) 100-12.5 MG per tablet Take 1 tablet by mouth daily.        . Multiple Vitamin (MULTIVITAMIN) tablet Take 1 tablet by mouth daily.      Marland Kitchen omeprazole (PRILOSEC) 20 MG capsule Take 20 mg by mouth daily.        Marland Kitchen oxyCODONE-acetaminophen (PERCOCET) 7.5-325 MG per tablet Take 1 tablet by mouth every 4 (four) hours as needed.        . potassium chloride (KLOR-CON 10) 10 MEQ tablet Take 1 tablet (10 mEq total) by mouth daily.  30 tablet  6  . pravastatin (PRAVACHOL) 10 MG tablet Take 10 mg by mouth daily.        . vitamin E 400 UNIT capsule Take 400 Units by mouth daily.        Marland Kitchen warfarin (COUMADIN) 5 MG tablet TAKE ONCE DAILY AS DIRECTED BY COUMADIN CLINIC  40 tablet  3    Allergies  Allergen Reactions  . Horse-Derived Products     Past Medical History  Diagnosis Date  . Personal history of other diseases of circulatory system   . Unspecified essential hypertension   . Anxiety state, unspecified     past at death of spouse  . Depressive disorder, not elsewhere classified     past at death of spouse  . Other and unspecified hyperlipidemia   . Dyskinesia of esophagus   . Long term  (current) use of anticoagulants   . Hiatal hernia   . Duodenitis without mention of hemorrhage   . Unspecified gastritis and gastroduodenitis without mention of hemorrhage   . Unspecified hemorrhoids without mention of complication   . Diverticulosis of colon (without mention of hemorrhage)   . Barrett's esophagus   . Irritable bowel syndrome   . Unspecified sleep apnea   . Anemia   . Cardiac arrhythmia due to congenital heart disease   . Atrial fibrillation   . Skin cancer (melanoma)   . Status post dilation of esophageal narrowing   . GERD (gastroesophageal reflux disease)     Past Surgical History  Procedure Date  . Nissen fundoplication 1982  . Turp vaporization 1980s  . Carpal tunnel release 2003    bilateral  . Appendectomy 1945  . Tonsillectomy   . Vasectomy   . Lumbar laminectomy   . Small bowel obsturction     x4  . Cardiac catheterization 1991  . Central decompressive laminectomy     L2-L5  . Esophagogastroduodenoscopy   . Colonoscopy   .  Spinal cord stimulator implant 06/2011    Dr. Arlan Organ  . Eus 11/13/2011    Procedure: LOWER ENDOSCOPIC ULTRASOUND (EUS);  Surgeon: Rachael Fee, MD;  Location: [facility];  Service: Endoscopy;  Laterality: N/A;    Family History  Problem Relation Age of Onset  . Stroke Mother   . Coronary artery disease Father   . Colon cancer Neg Hx     History   Social History  . Marital Status: Widowed    Spouse Name: N/A    Number of Children: 1  . Years of Education: N/A   Occupational History  . retired    Social History Main Topics  . Smoking status: Former Smoker    Quit date: 03/31/1972  . Smokeless tobacco: Never Used  . Alcohol Use: 0.6 oz/week    1 Shots of liquor per week     Comment: ocass  . Drug Use: No  . Sexually Active: Not on file   Other Topics Concern  . Not on file   Social History Narrative   Alcohol use- yes occasional    ROS: Please see the HPI.  All other systems reviewed and  negative.  PHYSICAL EXAM:  BP 142/80  Pulse 61  Ht 5' 11.5" (1.816 m)  Wt 189 lb (85.73 kg)  BMI 25.99 kg/m2  SpO2 96%  General: Well developed, well nourished, in no acute distress. Head:  Normocephalic and atraumatic. Neck: no JVD Lungs: Clear to auscultation and percussion. Heart: irregularly irregular rhythm.  No def murmur.   Extremities: No clubbing or cyanosis. No edema. Neurologic: Alert and oriented x 3.  EKG:  Atrial fib with controlled ventricular response.    ASSESSMENT AND PLAN:

## 2012-04-05 NOTE — Assessment & Plan Note (Signed)
His rate is currently controlled.  He feels pretty good.  No changes at present.

## 2012-04-12 ENCOUNTER — Other Ambulatory Visit: Payer: Self-pay | Admitting: *Deleted

## 2012-04-12 MED ORDER — POTASSIUM CHLORIDE ER 10 MEQ PO TBCR: 10.0000 meq | EXTENDED_RELEASE_TABLET | Freq: Every day | ORAL | Status: DC

## 2012-04-26 ENCOUNTER — Ambulatory Visit (INDEPENDENT_AMBULATORY_CARE_PROVIDER_SITE_OTHER): Payer: Medicare Other | Admitting: *Deleted

## 2012-04-26 DIAGNOSIS — I4891 Unspecified atrial fibrillation: Secondary | ICD-10-CM

## 2012-04-26 DIAGNOSIS — Z7901 Long term (current) use of anticoagulants: Secondary | ICD-10-CM

## 2012-04-26 LAB — POCT INR: INR: 1.7

## 2012-05-10 ENCOUNTER — Ambulatory Visit (INDEPENDENT_AMBULATORY_CARE_PROVIDER_SITE_OTHER): Payer: Medicare Other | Admitting: Pharmacist

## 2012-05-10 DIAGNOSIS — I4891 Unspecified atrial fibrillation: Secondary | ICD-10-CM

## 2012-05-10 DIAGNOSIS — Z7901 Long term (current) use of anticoagulants: Secondary | ICD-10-CM

## 2012-05-10 LAB — POCT INR: INR: 2.2

## 2012-05-12 ENCOUNTER — Telehealth: Payer: Self-pay | Admitting: Cardiology

## 2012-05-12 NOTE — Telephone Encounter (Signed)
Will forward to Coumadin clinic to address.

## 2012-05-12 NOTE — Telephone Encounter (Signed)
Called and left a message with nurse doxycycline "possibly" has an interaction with coumadin, I can provide our list of antibiotics that don't interact with coumadin if they would like I can fax to them.

## 2012-05-12 NOTE — Telephone Encounter (Signed)
Dr. Elmer Picker wants to put pt on a low dose of doxycycline is that ok

## 2012-05-27 ENCOUNTER — Ambulatory Visit (INDEPENDENT_AMBULATORY_CARE_PROVIDER_SITE_OTHER): Payer: Medicare Other

## 2012-05-27 ENCOUNTER — Telehealth: Payer: Self-pay | Admitting: *Deleted

## 2012-05-27 DIAGNOSIS — I4891 Unspecified atrial fibrillation: Secondary | ICD-10-CM

## 2012-05-27 DIAGNOSIS — Z7901 Long term (current) use of anticoagulants: Secondary | ICD-10-CM

## 2012-05-27 NOTE — Telephone Encounter (Signed)
Pt called stating he has been on Doxycycline since  05/14/2012 so appt changed for him to be seen today

## 2012-06-03 ENCOUNTER — Encounter: Payer: Self-pay | Admitting: Cardiology

## 2012-06-03 ENCOUNTER — Ambulatory Visit (INDEPENDENT_AMBULATORY_CARE_PROVIDER_SITE_OTHER): Payer: Medicare Other | Admitting: Cardiology

## 2012-06-03 VITALS — BP 126/74 | HR 70 | Ht 71.5 in | Wt 188.0 lb

## 2012-06-03 DIAGNOSIS — I080 Rheumatic disorders of both mitral and aortic valves: Secondary | ICD-10-CM

## 2012-06-03 DIAGNOSIS — I4891 Unspecified atrial fibrillation: Secondary | ICD-10-CM

## 2012-06-03 NOTE — Assessment & Plan Note (Signed)
Last echo mild MR read by Dr. Eden Emms.

## 2012-06-03 NOTE — Progress Notes (Signed)
HPI:  This nice patient seen today back in followup. In general, he is stable. He is very little feeling in his feet. We reviewed many of his blood pressure recordings, and these date back to the fall. Most of them are in the adequate range although somewhat elevated.  All are asymptomatic. No chest pain.    Current Outpatient Prescriptions  Medication Sig Dispense Refill  . amLODipine (NORVASC) 5 MG tablet Take 1 tablet (5 mg total) by mouth daily.  30 tablet  11  . Calcium 600 MG tablet Take 100 mg by mouth 4 (four) times daily. Every other day      . carvedilol (COREG) 12.5 MG tablet TAKE 1 TABLET BY MOUTH TWICE A DAY WITH MEAL  180 tablet  3  . cholecalciferol (VITAMIN D) 1000 UNITS tablet Take 1,000 Units by mouth daily.        Marland Kitchen doxycycline (VIBRAMYCIN) 50 MG capsule Take 100 mg by mouth daily.       . DULoxetine (CYMBALTA) 30 MG capsule Take 30 mg by mouth daily.      . Ferrous Sulfate (IRON) 325 (65 FE) MG TABS Take 1 tablet by mouth daily.        . fish oil-omega-3 fatty acids 1000 MG capsule Take 1 g by mouth 4 (four) times daily.      Marland Kitchen losartan-hydrochlorothiazide (HYZAAR) 100-12.5 MG per tablet Take 1 tablet by mouth daily.        . Multiple Vitamin (MULTIVITAMIN) tablet Take 1 tablet by mouth daily.      Marland Kitchen omeprazole (PRILOSEC) 20 MG capsule Take 20 mg by mouth daily.        Marland Kitchen oxyCODONE-acetaminophen (PERCOCET) 7.5-325 MG per tablet Take 1 tablet by mouth every 4 (four) hours as needed.        . potassium chloride (KLOR-CON 10) 10 MEQ tablet Take 1 tablet (10 mEq total) by mouth daily.  30 tablet  6  . pravastatin (PRAVACHOL) 10 MG tablet Take 10 mg by mouth daily.        . vitamin E 400 UNIT capsule Take 400 Units by mouth daily.        Marland Kitchen warfarin (COUMADIN) 5 MG tablet TAKE ONCE DAILY AS DIRECTED BY COUMADIN CLINIC  40 tablet  3   No current facility-administered medications for this visit.    Allergies  Allergen Reactions  . Horse-Derived Products     Past Medical  History  Diagnosis Date  . Personal history of other diseases of circulatory system   . Unspecified essential hypertension   . Anxiety state, unspecified     past at death of spouse  . Depressive disorder, not elsewhere classified     past at death of spouse  . Other and unspecified hyperlipidemia   . Dyskinesia of esophagus   . Long term (current) use of anticoagulants   . Hiatal hernia   . Duodenitis without mention of hemorrhage   . Unspecified gastritis and gastroduodenitis without mention of hemorrhage   . Unspecified hemorrhoids without mention of complication   . Diverticulosis of colon (without mention of hemorrhage)   . Barrett's esophagus   . Irritable bowel syndrome   . Unspecified sleep apnea   . Anemia   . Cardiac arrhythmia due to congenital heart disease   . Atrial fibrillation   . Skin cancer (melanoma)   . Status post dilation of esophageal narrowing   . GERD (gastroesophageal reflux disease)     Past Surgical History  Procedure Laterality Date  . Nissen fundoplication  1982  . Turp vaporization  1980s  . Carpal tunnel release  2003    bilateral  . Appendectomy  1945  . Tonsillectomy    . Vasectomy    . Lumbar laminectomy    . Small bowel obsturction      x4  . Cardiac catheterization  1991  . Central decompressive laminectomy      L2-L5  . Esophagogastroduodenoscopy    . Colonoscopy    . Spinal cord stimulator implant  06/2011    Dr. Arlan Organ  . Eus  11/13/2011    Procedure: LOWER ENDOSCOPIC ULTRASOUND (EUS);  Surgeon: Rachael Fee, MD;  Location: [facility];  Service: Endoscopy;  Laterality: N/A;    Family History  Problem Relation Age of Onset  . Stroke Mother   . Coronary artery disease Father   . Colon cancer Neg Hx     History   Social History  . Marital Status: Widowed    Spouse Name: N/A    Number of Children: 1  . Years of Education: N/A   Occupational History  . retired    Social History Main Topics  . Smoking status:  Former Smoker    Quit date: 03/31/1972  . Smokeless tobacco: Never Used  . Alcohol Use: 0.6 oz/week    1 Shots of liquor per week     Comment: ocass  . Drug Use: No  . Sexually Active: Not on file   Other Topics Concern  . Not on file   Social History Narrative   Alcohol use- yes occasional    ROS: Please see the HPI.  All other systems reviewed and negative.  PHYSICAL EXAM:  BP 126/74  Pulse 70  Ht 5' 11.5" (1.816 m)  Wt 188 lb (85.276 kg)  BMI 25.86 kg/m2  General: Well developed, well nourished, in no acute distress. Head:  Normocephalic and atraumatic. Neck: no JVD Lungs: Clear to auscultation and percussion. Heart: irregularly irregular rhythm.  No def murmur.   Extremities: No clubbing or cyanosis. No edema.  Palpable pulses.   Neurologic: Alert and oriented x 3.  EKG:  Atrial fib.  RBBB.  Controlled ventricular response.    ASSESSMENT AND PLAN:

## 2012-06-03 NOTE — Patient Instructions (Addendum)
Your physician recommends that you schedule a follow-up appointment in: 3 MONTHS with Dr Jens Som (previous pt of Dr Riley Kill)  Your physician recommends that you continue on your current medications as directed. Please refer to the Current Medication list given to you today.    Please review your medications at home and confirm if you are taking Amlodipine.

## 2012-06-03 NOTE — Assessment & Plan Note (Signed)
Variable numbers that vary from borderline high systolics to normal pressures.  Would not alter treatment plan.

## 2012-06-03 NOTE — Assessment & Plan Note (Signed)
He has remained in atrial fib and been stable.  Recent start of doxycycline by his opthalmologist, but fu INR was therapuetic.  He will remain on warfarin indefinitely in the absence of drug related issues.

## 2012-06-03 NOTE — Assessment & Plan Note (Signed)
Followed by his primary provider.

## 2012-06-08 ENCOUNTER — Other Ambulatory Visit: Payer: Self-pay | Admitting: Dermatology

## 2012-06-24 ENCOUNTER — Ambulatory Visit (INDEPENDENT_AMBULATORY_CARE_PROVIDER_SITE_OTHER): Payer: Medicare Other | Admitting: *Deleted

## 2012-06-24 DIAGNOSIS — Z7901 Long term (current) use of anticoagulants: Secondary | ICD-10-CM

## 2012-06-24 DIAGNOSIS — I4891 Unspecified atrial fibrillation: Secondary | ICD-10-CM

## 2012-06-24 LAB — POCT INR: INR: 2.7

## 2012-07-22 ENCOUNTER — Ambulatory Visit (INDEPENDENT_AMBULATORY_CARE_PROVIDER_SITE_OTHER): Payer: Medicare Other

## 2012-07-22 DIAGNOSIS — Z7901 Long term (current) use of anticoagulants: Secondary | ICD-10-CM

## 2012-07-22 DIAGNOSIS — I4891 Unspecified atrial fibrillation: Secondary | ICD-10-CM

## 2012-07-22 LAB — POCT INR: INR: 2.3

## 2012-07-23 ENCOUNTER — Other Ambulatory Visit: Payer: Self-pay | Admitting: *Deleted

## 2012-07-23 MED ORDER — WARFARIN SODIUM 5 MG PO TABS: ORAL_TABLET | ORAL | Status: DC

## 2012-08-26 ENCOUNTER — Other Ambulatory Visit: Payer: Self-pay | Admitting: Cardiology

## 2012-08-26 DIAGNOSIS — I10 Essential (primary) hypertension: Secondary | ICD-10-CM

## 2012-08-26 DIAGNOSIS — I4891 Unspecified atrial fibrillation: Secondary | ICD-10-CM

## 2012-08-26 MED ORDER — AMLODIPINE BESYLATE 5 MG PO TABS: 5.0000 mg | ORAL_TABLET | Freq: Every day | ORAL | Status: DC

## 2012-09-03 ENCOUNTER — Ambulatory Visit (INDEPENDENT_AMBULATORY_CARE_PROVIDER_SITE_OTHER): Payer: Medicare Other | Admitting: *Deleted

## 2012-09-03 DIAGNOSIS — I4891 Unspecified atrial fibrillation: Secondary | ICD-10-CM

## 2012-09-03 DIAGNOSIS — Z7901 Long term (current) use of anticoagulants: Secondary | ICD-10-CM

## 2012-09-14 ENCOUNTER — Encounter: Payer: Self-pay | Admitting: Cardiology

## 2012-09-14 ENCOUNTER — Ambulatory Visit (INDEPENDENT_AMBULATORY_CARE_PROVIDER_SITE_OTHER): Payer: Medicare Other | Admitting: Cardiology

## 2012-09-14 VITALS — BP 146/80 | HR 60 | Wt 188.0 lb

## 2012-09-14 DIAGNOSIS — E785 Hyperlipidemia, unspecified: Secondary | ICD-10-CM

## 2012-09-14 DIAGNOSIS — I10 Essential (primary) hypertension: Secondary | ICD-10-CM

## 2012-09-14 DIAGNOSIS — I4891 Unspecified atrial fibrillation: Secondary | ICD-10-CM

## 2012-09-14 NOTE — Assessment & Plan Note (Signed)
Patient has permanent atrial fibrillation. Continue beta blocker for rate control. Continue Coumadin. Patient given the name of apixaban. He will check with his insurance company and if covered we will make the switch. We will obtain most recent renal function and hemoglobin from his primary care physician.

## 2012-09-14 NOTE — Progress Notes (Signed)
HPI: 79 yo male previously followed by Dr Riley Kill for fu of permanent atrial fibrillation. Last echo in May of 2011 showed normal LV function, mild LVH, mild MR and trace AI; mild biatrial enlargement. Patient last seen 3/14. Since then, he has an occasional feeling of needing to take a deep breath. He denies dyspnea on exertion, orthopnea, PND, palpitations, syncope or chest pain. Chronic mild pedal edema.  Current Outpatient Prescriptions  Medication Sig Dispense Refill  . amLODipine (NORVASC) 5 MG tablet Take 1 tablet (5 mg total) by mouth daily.  30 tablet  3  . Calcium 600 MG tablet Take 100 mg by mouth 4 (four) times daily. Every other day      . carvedilol (COREG) 12.5 MG tablet TAKE 1 TABLET BY MOUTH TWICE A DAY WITH MEAL  180 tablet  3  . cholecalciferol (VITAMIN D) 1000 UNITS tablet Take 1,000 Units by mouth daily.        . Ferrous Sulfate (IRON) 325 (65 FE) MG TABS Take 1 tablet by mouth daily.        . fish oil-omega-3 fatty acids 1000 MG capsule Take 1 g by mouth 4 (four) times daily.      Marland Kitchen losartan-hydrochlorothiazide (HYZAAR) 100-12.5 MG per tablet Take 1 tablet by mouth daily.        . Multiple Vitamin (MULTIVITAMIN) tablet Take 1 tablet by mouth daily.      Marland Kitchen omeprazole (PRILOSEC) 20 MG capsule Take 20 mg by mouth daily.        Marland Kitchen oxyCODONE-acetaminophen (PERCOCET) 7.5-325 MG per tablet Take 1 tablet by mouth every 4 (four) hours as needed.        . potassium chloride (K-DUR,KLOR-CON) 10 MEQ tablet Take 10 mEq by mouth daily.      . pravastatin (PRAVACHOL) 10 MG tablet Take 10 mg by mouth daily.        . vitamin E 400 UNIT capsule Take 400 Units by mouth daily.        Marland Kitchen warfarin (COUMADIN) 5 MG tablet Take as directed by coumadin clinic  40 tablet  3   No current facility-administered medications for this visit.     Past Medical History  Diagnosis Date  . Personal history of other diseases of circulatory system   . Unspecified essential hypertension   . Anxiety state,  unspecified     past at death of spouse  . Depressive disorder, not elsewhere classified     past at death of spouse  . Other and unspecified hyperlipidemia   . Dyskinesia of esophagus   . Long term (current) use of anticoagulants   . Hiatal hernia   . Duodenitis without mention of hemorrhage   . Unspecified gastritis and gastroduodenitis without mention of hemorrhage   . Unspecified hemorrhoids without mention of complication   . Diverticulosis of colon (without mention of hemorrhage)   . Barrett's esophagus   . Irritable bowel syndrome   . Unspecified sleep apnea   . Anemia   . Cardiac arrhythmia due to congenital heart disease   . Atrial fibrillation   . Skin cancer (melanoma)   . Status post dilation of esophageal narrowing   . GERD (gastroesophageal reflux disease)     Past Surgical History  Procedure Laterality Date  . Nissen fundoplication  1982  . Turp vaporization  1980s  . Carpal tunnel release  2003    bilateral  . Appendectomy  1945  . Tonsillectomy    . Vasectomy    .  Lumbar laminectomy    . Small bowel obsturction      x4  . Cardiac catheterization  1991  . Central decompressive laminectomy      L2-L5  . Esophagogastroduodenoscopy    . Colonoscopy    . Spinal cord stimulator implant  06/2011    Dr. Arlan Organ  . Eus  11/13/2011    Procedure: LOWER ENDOSCOPIC ULTRASOUND (EUS);  Surgeon: Rachael Fee, MD;  Location: [facility];  Service: Endoscopy;  Laterality: N/A;    History   Social History  . Marital Status: Widowed    Spouse Name: N/A    Number of Children: 1  . Years of Education: N/A   Occupational History  . retired    Social History Main Topics  . Smoking status: Former Smoker    Quit date: 03/31/1972  . Smokeless tobacco: Never Used  . Alcohol Use: 0.6 oz/week    1 Shots of liquor per week     Comment: ocass  . Drug Use: No  . Sexually Active: Not on file   Other Topics Concern  . Not on file   Social History Narrative    Alcohol use- yes occasional    ROS: back pain but no fevers or chills, productive cough, hemoptysis, dysphasia, odynophagia, melena, hematochezia, dysuria, hematuria, rash, seizure activity, orthopnea, PND, claudication. Remaining systems are negative.  Physical Exam: Well-developed well-nourished in no acute distress.  Skin is warm and dry.  HEENT is normal.  Neck is supple.  Chest is clear to auscultation with normal expansion.  Cardiovascular exam is irregular Abdominal exam nontender or distended. No masses palpated. Extremities show trace edema. neuro grossly intact  ECG atrial fibrillation at a rate of 60. Right bundle branch block. Nonspecific ST changes.

## 2012-09-14 NOTE — Assessment & Plan Note (Signed)
Continue present medications. 

## 2012-09-14 NOTE — Assessment & Plan Note (Signed)
Continue statin. Management per primary care. 

## 2012-09-14 NOTE — Patient Instructions (Addendum)
Your physician wants you to follow-up in: 6 MONTHS WITH DR Jens Som You will receive a reminder letter in the mail two months in advance. If you don't receive a letter, please call our office to schedule the follow-up appointment.  ELIQUIS TO REPLACE WARFARIN

## 2012-10-19 ENCOUNTER — Ambulatory Visit (INDEPENDENT_AMBULATORY_CARE_PROVIDER_SITE_OTHER): Payer: Medicare Other | Admitting: *Deleted

## 2012-10-19 DIAGNOSIS — I4891 Unspecified atrial fibrillation: Secondary | ICD-10-CM

## 2012-10-19 DIAGNOSIS — Z7901 Long term (current) use of anticoagulants: Secondary | ICD-10-CM

## 2012-10-19 LAB — POCT INR: INR: 3.1

## 2012-11-08 ENCOUNTER — Other Ambulatory Visit: Payer: Self-pay | Admitting: Cardiology

## 2012-11-13 ENCOUNTER — Other Ambulatory Visit: Payer: Self-pay | Admitting: Cardiology

## 2012-11-22 ENCOUNTER — Ambulatory Visit (INDEPENDENT_AMBULATORY_CARE_PROVIDER_SITE_OTHER): Payer: Medicare Other | Admitting: Pharmacist

## 2012-11-22 DIAGNOSIS — Z7901 Long term (current) use of anticoagulants: Secondary | ICD-10-CM

## 2012-11-22 DIAGNOSIS — I4891 Unspecified atrial fibrillation: Secondary | ICD-10-CM

## 2012-11-22 LAB — POCT INR: INR: 2.2

## 2012-11-25 ENCOUNTER — Other Ambulatory Visit: Payer: Self-pay | Admitting: Cardiology

## 2012-12-13 ENCOUNTER — Other Ambulatory Visit: Payer: Self-pay | Admitting: Cardiology

## 2012-12-22 ENCOUNTER — Other Ambulatory Visit: Payer: Self-pay | Admitting: Dermatology

## 2013-01-04 ENCOUNTER — Ambulatory Visit (INDEPENDENT_AMBULATORY_CARE_PROVIDER_SITE_OTHER): Payer: Medicare Other | Admitting: General Practice

## 2013-01-04 DIAGNOSIS — I4891 Unspecified atrial fibrillation: Secondary | ICD-10-CM

## 2013-01-04 DIAGNOSIS — Z7901 Long term (current) use of anticoagulants: Secondary | ICD-10-CM

## 2013-02-01 ENCOUNTER — Ambulatory Visit (INDEPENDENT_AMBULATORY_CARE_PROVIDER_SITE_OTHER): Payer: Medicare Other | Admitting: Pharmacist

## 2013-02-01 DIAGNOSIS — Z7901 Long term (current) use of anticoagulants: Secondary | ICD-10-CM

## 2013-02-01 DIAGNOSIS — I4891 Unspecified atrial fibrillation: Secondary | ICD-10-CM

## 2013-02-15 ENCOUNTER — Ambulatory Visit (INDEPENDENT_AMBULATORY_CARE_PROVIDER_SITE_OTHER): Payer: Medicare Other | Admitting: General Practice

## 2013-02-15 DIAGNOSIS — Z7901 Long term (current) use of anticoagulants: Secondary | ICD-10-CM

## 2013-02-15 DIAGNOSIS — I4891 Unspecified atrial fibrillation: Secondary | ICD-10-CM

## 2013-03-08 ENCOUNTER — Ambulatory Visit (INDEPENDENT_AMBULATORY_CARE_PROVIDER_SITE_OTHER): Payer: Medicare Other | Admitting: *Deleted

## 2013-03-08 DIAGNOSIS — Z7901 Long term (current) use of anticoagulants: Secondary | ICD-10-CM

## 2013-03-08 DIAGNOSIS — I4891 Unspecified atrial fibrillation: Secondary | ICD-10-CM

## 2013-03-08 LAB — POCT INR: INR: 2

## 2013-03-14 ENCOUNTER — Ambulatory Visit (INDEPENDENT_AMBULATORY_CARE_PROVIDER_SITE_OTHER): Payer: Medicare Other | Admitting: Cardiology

## 2013-03-14 ENCOUNTER — Encounter: Payer: Self-pay | Admitting: Cardiology

## 2013-03-14 VITALS — BP 138/80 | HR 62 | Ht 72.0 in | Wt 187.0 lb

## 2013-03-14 DIAGNOSIS — I10 Essential (primary) hypertension: Secondary | ICD-10-CM

## 2013-03-14 DIAGNOSIS — E785 Hyperlipidemia, unspecified: Secondary | ICD-10-CM

## 2013-03-14 DIAGNOSIS — I4891 Unspecified atrial fibrillation: Secondary | ICD-10-CM

## 2013-03-14 NOTE — Patient Instructions (Signed)
Your physician wants you to follow-up in: 6 MONTHS WITH DR CRENSHAW You will receive a reminder letter in the mail two months in advance. If you don't receive a letter, please call our office to schedule the follow-up appointment.  

## 2013-03-14 NOTE — Assessment & Plan Note (Addendum)
Patient remains in permanent atrial fibrillation. Continue Coreg for rate control. Continue Coumadin. Not interested in NOAC.

## 2013-03-14 NOTE — Assessment & Plan Note (Signed)
Continue present blood pressure medications. 

## 2013-03-14 NOTE — Assessment & Plan Note (Signed)
Continue statin. 

## 2013-03-14 NOTE — Progress Notes (Signed)
HPI: FU permanent atrial fibrillation. Last echo in May of 2011 showed normal LV function, mild LVH, mild MR and trace AI; mild biatrial enlargement. Patient last seen 6/14. Since then, the patient denies any dyspnea on exertion, orthopnea, PND, palpitations, syncope or chest pain. Occasional mild pedal edema.   Current Outpatient Prescriptions  Medication Sig Dispense Refill  . amLODipine (NORVASC) 5 MG tablet TAKE 1 TABLET BY MOUTH EVERY DAY  30 tablet  3  . Calcium 600 MG tablet Take 100 mg by mouth 4 (four) times daily. Every other day      . carvedilol (COREG) 12.5 MG tablet TAKE 1 TABLET BY MOUTH TWICE DAILY WITH A MEAL  180 tablet  3  . cholecalciferol (VITAMIN D) 1000 UNITS tablet Take 1,000 Units by mouth daily.        . Ferrous Sulfate (IRON) 325 (65 FE) MG TABS Take 1 tablet by mouth daily.        . fish oil-omega-3 fatty acids 1000 MG capsule Take 1 g by mouth 4 (four) times daily.      Marland Kitchen losartan-hydrochlorothiazide (HYZAAR) 100-12.5 MG per tablet Take 1 tablet by mouth daily.        . Magnesium 100 MG CAPS Take 1 capsule by mouth daily.      . Multiple Vitamin (MULTIVITAMIN) tablet Take 1 tablet by mouth daily.      Marland Kitchen omeprazole (PRILOSEC) 20 MG capsule Take 20 mg by mouth daily.        Marland Kitchen oxyCODONE-acetaminophen (PERCOCET) 7.5-325 MG per tablet Take 1 tablet by mouth every 4 (four) hours as needed.        . potassium chloride (K-DUR,KLOR-CON) 10 MEQ tablet TAKE 1 TABLET BY MOUTH EVERY DAY  30 tablet  6  . pravastatin (PRAVACHOL) 10 MG tablet Take 10 mg by mouth daily.        . vitamin E 400 UNIT capsule Take 400 Units by mouth daily.        Marland Kitchen warfarin (COUMADIN) 5 MG tablet TAKE AS DIRECTED BY COUMADIN CLINIC  40 tablet  3   No current facility-administered medications for this visit.     Past Medical History  Diagnosis Date  . Personal history of other diseases of circulatory system   . Unspecified essential hypertension   . Anxiety state, unspecified     past  at death of spouse  . Depressive disorder, not elsewhere classified     past at death of spouse  . Other and unspecified hyperlipidemia   . Dyskinesia of esophagus   . Long term (current) use of anticoagulants   . Hiatal hernia   . Duodenitis without mention of hemorrhage   . Unspecified gastritis and gastroduodenitis without mention of hemorrhage   . Unspecified hemorrhoids without mention of complication   . Diverticulosis of colon (without mention of hemorrhage)   . Barrett's esophagus   . Irritable bowel syndrome   . Unspecified sleep apnea   . Anemia   . Cardiac arrhythmia due to congenital heart disease   . Atrial fibrillation   . Skin cancer (melanoma)   . Status post dilation of esophageal narrowing   . GERD (gastroesophageal reflux disease)     Past Surgical History  Procedure Laterality Date  . Nissen fundoplication  1982  . Turp vaporization  1980s  . Carpal tunnel release  2003    bilateral  . Appendectomy  1945  . Tonsillectomy    . Vasectomy    .  Lumbar laminectomy    . Small bowel obsturction      x4  . Cardiac catheterization  1991  . Central decompressive laminectomy      L2-L5  . Esophagogastroduodenoscopy    . Colonoscopy    . Spinal cord stimulator implant  06/2011    Dr. Arlan Organ  . Eus  11/13/2011    Procedure: LOWER ENDOSCOPIC ULTRASOUND (EUS);  Surgeon: Rachael Fee, MD;  Location: [facility];  Service: Endoscopy;  Laterality: N/A;    History   Social History  . Marital Status: Widowed    Spouse Name: N/A    Number of Children: 1  . Years of Education: N/A   Occupational History  . retired    Social History Main Topics  . Smoking status: Former Smoker    Quit date: 03/31/1972  . Smokeless tobacco: Never Used  . Alcohol Use: 0.6 oz/week    1 Shots of liquor per week     Comment: ocass  . Drug Use: No  . Sexual Activity: Not on file   Other Topics Concern  . Not on file   Social History Narrative   Alcohol use- yes  occasional    ROS: back pain but no fevers or chills, productive cough, hemoptysis, dysphasia, odynophagia, melena, hematochezia, dysuria, hematuria, rash, seizure activity, orthopnea, PND, pedal edema, claudication. Remaining systems are negative.  Physical Exam: Well-developed well-nourished in no acute distress.  Skin is warm and dry.  HEENT is normal.  Neck is supple.  Chest is clear to auscultation with normal expansion.  Cardiovascular exam is irregular Abdominal exam nontender or distended. No masses palpated. Extremities show trace edema. neuro grossly intact  ECG atrial fibrillation at a rate of 62. Right bundle branch block.

## 2013-04-01 ENCOUNTER — Other Ambulatory Visit: Payer: Self-pay | Admitting: Cardiology

## 2013-04-05 ENCOUNTER — Ambulatory Visit (INDEPENDENT_AMBULATORY_CARE_PROVIDER_SITE_OTHER): Payer: Medicare Other | Admitting: *Deleted

## 2013-04-05 DIAGNOSIS — I4891 Unspecified atrial fibrillation: Secondary | ICD-10-CM

## 2013-04-05 DIAGNOSIS — Z7901 Long term (current) use of anticoagulants: Secondary | ICD-10-CM

## 2013-04-05 LAB — POCT INR: INR: 2

## 2013-04-20 ENCOUNTER — Ambulatory Visit (INDEPENDENT_AMBULATORY_CARE_PROVIDER_SITE_OTHER): Payer: Medicare Other | Admitting: *Deleted

## 2013-04-20 DIAGNOSIS — Z7901 Long term (current) use of anticoagulants: Secondary | ICD-10-CM

## 2013-04-20 DIAGNOSIS — Z5181 Encounter for therapeutic drug level monitoring: Secondary | ICD-10-CM

## 2013-04-20 DIAGNOSIS — I4891 Unspecified atrial fibrillation: Secondary | ICD-10-CM

## 2013-04-20 LAB — POCT INR: INR: 1.9

## 2013-04-26 ENCOUNTER — Other Ambulatory Visit: Payer: Self-pay

## 2013-04-26 MED ORDER — AMLODIPINE BESYLATE 5 MG PO TABS: ORAL_TABLET | ORAL | Status: DC

## 2013-05-12 ENCOUNTER — Ambulatory Visit (INDEPENDENT_AMBULATORY_CARE_PROVIDER_SITE_OTHER): Payer: Medicare Other | Admitting: Pharmacist

## 2013-05-12 DIAGNOSIS — Z5181 Encounter for therapeutic drug level monitoring: Secondary | ICD-10-CM

## 2013-05-12 DIAGNOSIS — I4891 Unspecified atrial fibrillation: Secondary | ICD-10-CM

## 2013-05-12 DIAGNOSIS — Z7901 Long term (current) use of anticoagulants: Secondary | ICD-10-CM

## 2013-05-12 LAB — POCT INR: INR: 2.1

## 2013-05-30 ENCOUNTER — Other Ambulatory Visit: Payer: Self-pay | Admitting: Cardiology

## 2013-06-04 ENCOUNTER — Other Ambulatory Visit: Payer: Self-pay | Admitting: Cardiology

## 2013-06-06 ENCOUNTER — Ambulatory Visit (INDEPENDENT_AMBULATORY_CARE_PROVIDER_SITE_OTHER): Payer: Medicare Other | Admitting: Pharmacist

## 2013-06-06 DIAGNOSIS — I4891 Unspecified atrial fibrillation: Secondary | ICD-10-CM

## 2013-06-06 DIAGNOSIS — Z7901 Long term (current) use of anticoagulants: Secondary | ICD-10-CM

## 2013-06-06 DIAGNOSIS — Z5181 Encounter for therapeutic drug level monitoring: Secondary | ICD-10-CM

## 2013-06-06 LAB — POCT INR: INR: 2.7

## 2013-07-18 ENCOUNTER — Ambulatory Visit (INDEPENDENT_AMBULATORY_CARE_PROVIDER_SITE_OTHER): Payer: Medicare Other | Admitting: *Deleted

## 2013-07-18 DIAGNOSIS — Z5181 Encounter for therapeutic drug level monitoring: Secondary | ICD-10-CM

## 2013-07-18 DIAGNOSIS — I4891 Unspecified atrial fibrillation: Secondary | ICD-10-CM

## 2013-07-18 DIAGNOSIS — Z7901 Long term (current) use of anticoagulants: Secondary | ICD-10-CM

## 2013-07-18 LAB — POCT INR: INR: 2.6

## 2013-08-17 ENCOUNTER — Other Ambulatory Visit: Payer: Self-pay | Admitting: *Deleted

## 2013-08-17 MED ORDER — AMLODIPINE BESYLATE 5 MG PO TABS: ORAL_TABLET | ORAL | Status: DC

## 2013-08-29 ENCOUNTER — Other Ambulatory Visit: Payer: Self-pay | Admitting: *Deleted

## 2013-08-29 ENCOUNTER — Other Ambulatory Visit: Payer: Self-pay | Admitting: Cardiology

## 2013-08-29 MED ORDER — WARFARIN SODIUM 5 MG PO TABS: ORAL_TABLET | ORAL | Status: DC

## 2013-09-01 ENCOUNTER — Ambulatory Visit (INDEPENDENT_AMBULATORY_CARE_PROVIDER_SITE_OTHER): Payer: Medicare Other | Admitting: Cardiology

## 2013-09-01 ENCOUNTER — Ambulatory Visit (INDEPENDENT_AMBULATORY_CARE_PROVIDER_SITE_OTHER): Payer: Medicare Other

## 2013-09-01 ENCOUNTER — Encounter: Payer: Self-pay | Admitting: Cardiology

## 2013-09-01 VITALS — BP 141/72 | HR 50 | Ht 71.5 in | Wt 182.0 lb

## 2013-09-01 DIAGNOSIS — I10 Essential (primary) hypertension: Secondary | ICD-10-CM

## 2013-09-01 DIAGNOSIS — I4891 Unspecified atrial fibrillation: Secondary | ICD-10-CM

## 2013-09-01 DIAGNOSIS — Z5181 Encounter for therapeutic drug level monitoring: Secondary | ICD-10-CM

## 2013-09-01 DIAGNOSIS — Z7901 Long term (current) use of anticoagulants: Secondary | ICD-10-CM

## 2013-09-01 DIAGNOSIS — E785 Hyperlipidemia, unspecified: Secondary | ICD-10-CM

## 2013-09-01 LAB — POCT INR: INR: 2.6

## 2013-09-01 NOTE — Assessment & Plan Note (Signed)
Continue beta blocker and Coumadin. Hemoglobin monitored by primary care. Goal INR 2-3.

## 2013-09-01 NOTE — Progress Notes (Signed)
HPI: FU permanent atrial fibrillation. Last echo in May of 2011 showed normal LV function, mild LVH, mild MR and trace AI; mild biatrial enlargement. Patient last seen 12/14. Since then, the patient denies any dyspnea on exertion, orthopnea, PND, palpitations, syncope or chest pain. Occasional mild pedal edema.   Current Outpatient Prescriptions  Medication Sig Dispense Refill  . amLODipine (NORVASC) 5 MG tablet TAKE 1 TABLET BY MOUTH EVERY DAY  30 tablet  0  . Calcium 600 MG tablet Take 2 tablets by mouth 2 (two) times daily. Every other day      . carvedilol (COREG) 12.5 MG tablet TAKE 1 TABLET BY MOUTH TWICE DAILY WITH A MEAL  180 tablet  3  . cholecalciferol (VITAMIN D) 1000 UNITS tablet Take 1,000 Units by mouth daily.        . Ferrous Sulfate (IRON) 325 (65 FE) MG TABS Take 1 tablet by mouth daily.        . fish oil-omega-3 fatty acids 1000 MG capsule Take 2 capsules by mouth 2 (two) times daily.       Marland Kitchen KLOR-CON M10 10 MEQ tablet TAKE 1 TABLET BY MOUTH EVERY DAY  30 tablet  3  . losartan-hydrochlorothiazide (HYZAAR) 100-12.5 MG per tablet Take 1 tablet by mouth daily.        . Magnesium 100 MG CAPS Take 1 capsule by mouth daily.      . Multiple Vitamin (MULTIVITAMIN) tablet Take 1 tablet by mouth daily.      Marland Kitchen omeprazole (PRILOSEC) 20 MG capsule Take 20 mg by mouth daily.        Marland Kitchen oxyCODONE-acetaminophen (PERCOCET) 7.5-325 MG per tablet Take 1 tablet by mouth every 4 (four) hours as needed.        . pravastatin (PRAVACHOL) 10 MG tablet Take 10 mg by mouth daily.        . vitamin E 400 UNIT capsule Take 400 Units by mouth daily.        Marland Kitchen warfarin (COUMADIN) 5 MG tablet TAKE AS DIRECTED BY COUMADIN CLINIC  40 tablet  3   No current facility-administered medications for this visit.     Past Medical History  Diagnosis Date  . Personal history of other diseases of circulatory system   . Unspecified essential hypertension   . Anxiety state, unspecified     past at death of  spouse  . Depressive disorder, not elsewhere classified     past at death of spouse  . Other and unspecified hyperlipidemia   . Dyskinesia of esophagus   . Long term (current) use of anticoagulants   . Hiatal hernia   . Duodenitis without mention of hemorrhage   . Unspecified gastritis and gastroduodenitis without mention of hemorrhage   . Unspecified hemorrhoids without mention of complication   . Diverticulosis of colon (without mention of hemorrhage)   . Barrett's esophagus   . Irritable bowel syndrome   . Unspecified sleep apnea   . Anemia   . Cardiac arrhythmia due to congenital heart disease   . Atrial fibrillation   . Skin cancer (melanoma)   . Status post dilation of esophageal narrowing   . GERD (gastroesophageal reflux disease)     Past Surgical History  Procedure Laterality Date  . Nissen fundoplication  4742  . Turp vaporization  1980s  . Carpal tunnel release  2003    bilateral  . Appendectomy  1945  . Tonsillectomy    . Vasectomy    .  Lumbar laminectomy    . Small bowel obsturction      x4  . Cardiac catheterization  1991  . Central decompressive laminectomy      L2-L5  . Esophagogastroduodenoscopy    . Colonoscopy    . Spinal cord stimulator implant  06/2011    Dr. Anselm Lis  . Eus  11/13/2011    Procedure: LOWER ENDOSCOPIC ULTRASOUND (EUS);  Surgeon: Milus Banister, MD;  Location: [facility];  Service: Endoscopy;  Laterality: N/A;    History   Social History  . Marital Status: Widowed    Spouse Name: N/A    Number of Children: 1  . Years of Education: N/A   Occupational History  . retired    Social History Main Topics  . Smoking status: Former Smoker    Quit date: 03/31/1972  . Smokeless tobacco: Never Used  . Alcohol Use: 0.6 oz/week    1 Shots of liquor per week     Comment: ocass  . Drug Use: No  . Sexual Activity: Not on file   Other Topics Concern  . Not on file   Social History Narrative   Alcohol use- yes occasional     ROS: no fevers or chills, productive cough, hemoptysis, dysphasia, odynophagia, melena, hematochezia, dysuria, hematuria, rash, seizure activity, orthopnea, PND, pedal edema, claudication. Remaining systems are negative.  Physical Exam: Well-developed well-nourished in no acute distress.  Skin is warm and dry.  HEENT is normal.  Neck is supple.  Chest is clear to auscultation with normal expansion.  Cardiovascular exam is irregular Abdominal exam nontender or distended. No masses palpated. Extremities show trace edema. neuro grossly intact  Electrocardiogram 07/18/2013 showed atrial fibrillation with right bundle branch block.

## 2013-09-01 NOTE — Assessment & Plan Note (Signed)
Continue statin. 

## 2013-09-01 NOTE — Assessment & Plan Note (Signed)
Blood pressure controlled. Continue present medications. 

## 2013-09-01 NOTE — Patient Instructions (Signed)
Your physician wants you to follow-up in: ONE YEAR WITH DR CRENSHAW You will receive a reminder letter in the mail two months in advance. If you don't receive a letter, please call our office to schedule the follow-up appointment.  

## 2013-09-15 ENCOUNTER — Other Ambulatory Visit: Payer: Self-pay

## 2013-09-15 MED ORDER — AMLODIPINE BESYLATE 5 MG PO TABS: ORAL_TABLET | ORAL | Status: DC

## 2013-09-16 ENCOUNTER — Other Ambulatory Visit: Payer: Self-pay | Admitting: Dermatology

## 2013-09-20 ENCOUNTER — Ambulatory Visit: Payer: Medicare Other | Admitting: Cardiology

## 2013-10-04 ENCOUNTER — Other Ambulatory Visit: Payer: Self-pay | Admitting: *Deleted

## 2013-10-04 MED ORDER — POTASSIUM CHLORIDE CRYS ER 10 MEQ PO TBCR: EXTENDED_RELEASE_TABLET | ORAL | Status: DC

## 2013-10-11 ENCOUNTER — Other Ambulatory Visit: Payer: Self-pay | Admitting: Specialist

## 2013-10-11 ENCOUNTER — Ambulatory Visit
Admission: RE | Admit: 2013-10-11 | Discharge: 2013-10-11 | Disposition: A | Discharge status: Home / Self Care | Payer: Medicare Other | Source: Ambulatory Visit | Attending: Specialist | Admitting: Specialist

## 2013-10-11 DIAGNOSIS — M545 Low back pain, unspecified: Secondary | ICD-10-CM

## 2013-10-13 ENCOUNTER — Ambulatory Visit (INDEPENDENT_AMBULATORY_CARE_PROVIDER_SITE_OTHER): Payer: Medicare Other | Admitting: *Deleted

## 2013-10-13 DIAGNOSIS — I4891 Unspecified atrial fibrillation: Secondary | ICD-10-CM

## 2013-10-13 DIAGNOSIS — Z5181 Encounter for therapeutic drug level monitoring: Secondary | ICD-10-CM

## 2013-10-13 DIAGNOSIS — Z7901 Long term (current) use of anticoagulants: Secondary | ICD-10-CM

## 2013-10-13 LAB — POCT INR: INR: 2.1

## 2013-11-16 ENCOUNTER — Other Ambulatory Visit: Payer: Self-pay | Admitting: Internal Medicine

## 2013-11-16 DIAGNOSIS — M810 Age-related osteoporosis without current pathological fracture: Secondary | ICD-10-CM

## 2013-11-23 ENCOUNTER — Ambulatory Visit
Admission: RE | Admit: 2013-11-23 | Discharge: 2013-11-23 | Disposition: A | Discharge status: Home / Self Care | Payer: Medicare Other | Source: Ambulatory Visit | Attending: Internal Medicine | Admitting: Internal Medicine

## 2013-11-23 DIAGNOSIS — M810 Age-related osteoporosis without current pathological fracture: Secondary | ICD-10-CM

## 2013-11-24 ENCOUNTER — Ambulatory Visit (INDEPENDENT_AMBULATORY_CARE_PROVIDER_SITE_OTHER): Payer: Medicare Other | Admitting: Pharmacist

## 2013-11-24 DIAGNOSIS — Z5181 Encounter for therapeutic drug level monitoring: Secondary | ICD-10-CM

## 2013-11-24 DIAGNOSIS — I4891 Unspecified atrial fibrillation: Secondary | ICD-10-CM

## 2013-11-24 DIAGNOSIS — Z7901 Long term (current) use of anticoagulants: Secondary | ICD-10-CM

## 2013-11-24 LAB — POCT INR: INR: 2.2

## 2013-12-21 ENCOUNTER — Other Ambulatory Visit: Payer: Self-pay

## 2013-12-21 MED ORDER — CARVEDILOL 12.5 MG PO TABS: ORAL_TABLET | ORAL | Status: DC

## 2014-01-02 ENCOUNTER — Ambulatory Visit (INDEPENDENT_AMBULATORY_CARE_PROVIDER_SITE_OTHER): Payer: Medicare Other | Admitting: *Deleted

## 2014-01-02 DIAGNOSIS — Z5181 Encounter for therapeutic drug level monitoring: Secondary | ICD-10-CM

## 2014-01-02 DIAGNOSIS — Z7901 Long term (current) use of anticoagulants: Secondary | ICD-10-CM

## 2014-01-02 DIAGNOSIS — I4891 Unspecified atrial fibrillation: Secondary | ICD-10-CM

## 2014-01-02 LAB — POCT INR: INR: 2.5

## 2014-01-24 ENCOUNTER — Other Ambulatory Visit: Payer: Self-pay | Admitting: *Deleted

## 2014-01-24 MED ORDER — WARFARIN SODIUM 5 MG PO TABS: ORAL_TABLET | ORAL | Status: DC

## 2014-02-13 ENCOUNTER — Ambulatory Visit (INDEPENDENT_AMBULATORY_CARE_PROVIDER_SITE_OTHER): Payer: Medicare Other | Admitting: *Deleted

## 2014-02-13 DIAGNOSIS — Z7901 Long term (current) use of anticoagulants: Secondary | ICD-10-CM

## 2014-02-13 DIAGNOSIS — Z5181 Encounter for therapeutic drug level monitoring: Secondary | ICD-10-CM

## 2014-02-13 DIAGNOSIS — I4891 Unspecified atrial fibrillation: Secondary | ICD-10-CM

## 2014-02-13 LAB — POCT INR: INR: 1.6

## 2014-02-16 ENCOUNTER — Other Ambulatory Visit: Payer: Self-pay | Admitting: Specialist

## 2014-02-17 ENCOUNTER — Other Ambulatory Visit: Payer: Self-pay | Admitting: Specialist

## 2014-02-17 DIAGNOSIS — M5416 Radiculopathy, lumbar region: Secondary | ICD-10-CM

## 2014-02-27 ENCOUNTER — Ambulatory Visit (INDEPENDENT_AMBULATORY_CARE_PROVIDER_SITE_OTHER): Payer: Medicare Other | Admitting: Pharmacist

## 2014-02-27 DIAGNOSIS — I4891 Unspecified atrial fibrillation: Secondary | ICD-10-CM

## 2014-02-27 DIAGNOSIS — Z5181 Encounter for therapeutic drug level monitoring: Secondary | ICD-10-CM

## 2014-02-27 DIAGNOSIS — Z7901 Long term (current) use of anticoagulants: Secondary | ICD-10-CM

## 2014-02-27 LAB — POCT INR: INR: 1.2

## 2014-02-28 ENCOUNTER — Ambulatory Visit
Admission: RE | Admit: 2014-02-28 | Discharge: 2014-02-28 | Disposition: A | Discharge status: Home / Self Care | Payer: Medicare Other | Source: Ambulatory Visit | Attending: Specialist | Admitting: Specialist

## 2014-02-28 DIAGNOSIS — M5416 Radiculopathy, lumbar region: Secondary | ICD-10-CM

## 2014-02-28 MED ORDER — DIAZEPAM 5 MG PO TABS
5.0000 mg | ORAL_TABLET | Freq: Once | ORAL | Status: AC
  Administered 2014-02-28: 5 mg via ORAL

## 2014-02-28 MED ORDER — IOHEXOL 300 MG/ML  SOLN: 10.0000 mL | Freq: Once | INTRAMUSCULAR | Status: AC | PRN

## 2014-02-28 MED ORDER — ONDANSETRON HCL 4 MG/2ML IJ SOLN: 4.0000 mg | Freq: Once | INTRAMUSCULAR | Status: DC

## 2014-02-28 MED ORDER — MEPERIDINE HCL 100 MG/ML IJ SOLN: 75.0000 mg | Freq: Once | INTRAMUSCULAR | Status: DC

## 2014-02-28 NOTE — Progress Notes (Signed)
Patient states he has been off Prozac for the past two days.  He has been off Coumadin/Warfarin for the past four days.  His INR was 1.2 yesterday afternoon (in [facility] under "Labs").

## 2014-02-28 NOTE — Discharge Instructions (Signed)
Myelogram Discharge Instructions  1. Go home and rest quietly for the next 24 hours.  It is important to lie flat for the next 24 hours.  Get up only to go to the restroom.  You may lie in the bed or on a couch on your back, your stomach, your left side or your right side.  You may have one pillow under your head.  You may have pillows between your knees while you are on your side or under your knees while you are on your back.  2. DO NOT drive today.  Recline the seat as far back as it will go, while still wearing your seat belt, on the way home.  3. You may get up to go to the bathroom as needed.  You may sit up for 10 minutes to eat.  You may resume your normal diet and medications unless otherwise indicated.  Drink plenty of extra fluids today and tomorrow.  4. The incidence of a spinal headache with nausea and/or vomiting is about 5% (one in 20 patients).  If you develop a headache, lie flat and drink plenty of fluids until the headache goes away.  Caffeinated beverages may be helpful.  If you develop severe nausea and vomiting or a headache that does not go away with flat bed rest, call [phone].  5. You may resume normal activities after your 24 hours of bed rest is over; however, do not exert yourself strongly or do any heavy lifting tomorrow.  6. Call your physician for a follow-up appointment.   You may resume Warfarin/Coumadin today.  You may resume Prozac on Wednesday, March 01, 2014 after 11:00a.m.

## 2014-03-07 ENCOUNTER — Ambulatory Visit (INDEPENDENT_AMBULATORY_CARE_PROVIDER_SITE_OTHER): Payer: Medicare Other

## 2014-03-07 DIAGNOSIS — Z7901 Long term (current) use of anticoagulants: Secondary | ICD-10-CM

## 2014-03-07 DIAGNOSIS — Z5181 Encounter for therapeutic drug level monitoring: Secondary | ICD-10-CM

## 2014-03-07 DIAGNOSIS — I4891 Unspecified atrial fibrillation: Secondary | ICD-10-CM

## 2014-03-07 LAB — POCT INR: INR: 1.3

## 2014-03-17 ENCOUNTER — Ambulatory Visit (INDEPENDENT_AMBULATORY_CARE_PROVIDER_SITE_OTHER): Payer: Medicare Other | Admitting: *Deleted

## 2014-03-17 DIAGNOSIS — I4891 Unspecified atrial fibrillation: Secondary | ICD-10-CM

## 2014-03-17 DIAGNOSIS — Z5181 Encounter for therapeutic drug level monitoring: Secondary | ICD-10-CM

## 2014-03-17 DIAGNOSIS — Z7901 Long term (current) use of anticoagulants: Secondary | ICD-10-CM

## 2014-03-17 LAB — POCT INR: INR: 1.8

## 2014-03-28 ENCOUNTER — Ambulatory Visit (INDEPENDENT_AMBULATORY_CARE_PROVIDER_SITE_OTHER): Payer: Medicare Other | Admitting: *Deleted

## 2014-03-28 DIAGNOSIS — Z7901 Long term (current) use of anticoagulants: Secondary | ICD-10-CM

## 2014-03-28 DIAGNOSIS — Z5181 Encounter for therapeutic drug level monitoring: Secondary | ICD-10-CM

## 2014-03-28 DIAGNOSIS — I4891 Unspecified atrial fibrillation: Secondary | ICD-10-CM

## 2014-03-28 LAB — POCT INR: INR: 1.7

## 2014-04-10 ENCOUNTER — Other Ambulatory Visit: Payer: Self-pay | Admitting: *Deleted

## 2014-04-10 MED ORDER — AMLODIPINE BESYLATE 5 MG PO TABS: ORAL_TABLET | ORAL | Status: DC

## 2014-04-11 ENCOUNTER — Ambulatory Visit (INDEPENDENT_AMBULATORY_CARE_PROVIDER_SITE_OTHER): Payer: Medicare Other | Admitting: *Deleted

## 2014-04-11 DIAGNOSIS — I4891 Unspecified atrial fibrillation: Secondary | ICD-10-CM

## 2014-04-11 DIAGNOSIS — Z7901 Long term (current) use of anticoagulants: Secondary | ICD-10-CM

## 2014-04-11 DIAGNOSIS — Z5181 Encounter for therapeutic drug level monitoring: Secondary | ICD-10-CM

## 2014-04-11 LAB — POCT INR: INR: 2.8

## 2014-04-13 ENCOUNTER — Telehealth: Payer: Self-pay | Admitting: Pharmacist Clinician (PhC)/ Clinical Pharmacy Specialist

## 2014-04-13 NOTE — Telephone Encounter (Signed)
Pt called, has nosebleed since increasing dose of warfarin.  When questioned further, not a constant bleed, but just some blood on tissue with blowing.  Advised patient to avoid nose-blowing for a few days, if bleeding becomes free-flowing or constant he should see PCP or UC for treatment.

## 2014-04-26 ENCOUNTER — Encounter: Payer: Self-pay | Admitting: Neurology

## 2014-04-26 ENCOUNTER — Ambulatory Visit (INDEPENDENT_AMBULATORY_CARE_PROVIDER_SITE_OTHER): Payer: Medicare Other | Admitting: Neurology

## 2014-04-26 ENCOUNTER — Ambulatory Visit (INDEPENDENT_AMBULATORY_CARE_PROVIDER_SITE_OTHER): Payer: Self-pay | Admitting: Neurology

## 2014-04-26 DIAGNOSIS — M5416 Radiculopathy, lumbar region: Secondary | ICD-10-CM

## 2014-04-26 DIAGNOSIS — R202 Paresthesia of skin: Secondary | ICD-10-CM

## 2014-04-26 DIAGNOSIS — G609 Hereditary and idiopathic neuropathy, unspecified: Secondary | ICD-10-CM

## 2014-04-26 NOTE — Procedures (Signed)
HISTORY:  Ian Sullivan is an 81 year old gentleman with a history of 2 prior lumbosacral spine surgeries, with a spinal stimulator currently in place. He reports some low back pain issues, but he has had a progressive issue with gait imbalance. He uses a cane for ambulation. He indicates that he has some numbness in both feet going up to the knees bilaterally. He is being evaluated for possible neuropathy or a lumbosacral radiculopathy.  NERVE CONDUCTION STUDIES:  Nerve conduction studies were performed on both lower extremities. The distal motor latencies for the peroneal and posterior tibial nerves were normal bilaterally, with low motor amplitudes for the posterior tibial nerves bilaterally and for the left peroneal nerve, borderline normal motor amplitude for the right peroneal nerve. The nerve conduction velocities for the peroneal and posterior tibial nerves were normal bilaterally. The H reflex latencies were unobtainable bilaterally, and the peroneal sensory latencies were unobtainable bilaterally. Medial and lateral plantar sensory latencies were unobtainable bilaterally.  EMG STUDIES:  EMG study was performed on the right lower extremity:  The tibialis anterior muscle reveals 2 to 4K motor units with full recruitment. No fibrillations or positive waves were seen. The peroneus tertius muscle reveals 2 to 4K motor units with full recruitment. No fibrillations or positive waves were seen. The medial gastrocnemius muscle reveals 2 to 6K motor units with decreased recruitment. 2+ fibrillations and positive waves were seen. The vastus lateralis muscle reveals 2 to 4K motor units with full recruitment. No fibrillations or positive waves were seen. The iliopsoas muscle reveals 2 to 4K motor units with full recruitment. No fibrillations or positive waves were seen. The biceps femoris muscle (long head) reveals 2 to 4K motor units with full recruitment. No fibrillations or positive waves  were seen. The lumbosacral paraspinal muscles were tested at 3 levels, and revealed significant electrical artifact from the spinal stimulator at all 3 levels tested. There was good relaxation.  EMG study was performed on the left lower extremity:  The tibialis anterior muscle reveals 2 to 6K motor units with decreased recruitment. 2+ fibrillations and positive waves were seen. The peroneus tertius muscle reveals 2 to 5K motor units with decreased recruitment. 1+ fibrillations and positive waves were seen. The medial gastrocnemius muscle reveals up to 10K motor units with moderately decreased recruitment. 3+ fibrillations o and positive waves were seen. The vastus lateralis muscle reveals 2 to 4K motor units with full recruitment. No fibrillations or positive waves were seen. The iliopsoas muscle reveals 2 to 4K motor units with full recruitment. No fibrillations or positive waves were seen. The biceps femoris muscle (long head) reveals 2 to 5K motor units with slightly decreased recruitment. No fibrillations or positive waves were seen. The lumbosacral paraspinal muscles were tested at 3 levels, and revealed electrical artifact from the spinal stimulator at all 3 levels tested. There was good relaxation.   IMPRESSION:  Nerve conduction studies done on both lower extremities shows evidence of a mild to moderate peripheral neuropathy. In comparison to a prior study done on 11/23/2009, there has been mild progression of the peripheral neuropathy on this study. EMG evaluation of the right lower extremity shows evidence of a significant S1 radiculopathy with acute and chronic features. EMG study of the left lower extremity shows evidence of a significant acute and chronic S1 radiculopathy, with an overlying L5 radiculopathy with acute and chronic features of moderate severity. No other significant findings are noted.  Jill Alexanders MD 04/26/2014 4:22 PM  [facility] [address] [place],  [address]  Phone [phone] Fax [phone]

## 2014-04-26 NOTE — Progress Notes (Signed)
Please refer to EMG and nerve conduction study procedure note. 

## 2014-05-02 ENCOUNTER — Ambulatory Visit (INDEPENDENT_AMBULATORY_CARE_PROVIDER_SITE_OTHER): Payer: Medicare Other | Admitting: *Deleted

## 2014-05-02 DIAGNOSIS — Z7901 Long term (current) use of anticoagulants: Secondary | ICD-10-CM

## 2014-05-02 DIAGNOSIS — Z5181 Encounter for therapeutic drug level monitoring: Secondary | ICD-10-CM

## 2014-05-02 DIAGNOSIS — I4891 Unspecified atrial fibrillation: Secondary | ICD-10-CM

## 2014-05-02 LAB — POCT INR: INR: 2.5

## 2014-05-26 ENCOUNTER — Telehealth: Payer: Self-pay | Admitting: *Deleted

## 2014-05-26 NOTE — Telephone Encounter (Signed)
Clearance for epidural spine injection with the okay to hold coumadin 5 days prior to the procedure and resume day after faxed to the number provided.

## 2014-05-30 ENCOUNTER — Ambulatory Visit (INDEPENDENT_AMBULATORY_CARE_PROVIDER_SITE_OTHER): Payer: Medicare Other | Admitting: *Deleted

## 2014-05-30 DIAGNOSIS — Z5181 Encounter for therapeutic drug level monitoring: Secondary | ICD-10-CM

## 2014-05-30 DIAGNOSIS — I4891 Unspecified atrial fibrillation: Secondary | ICD-10-CM

## 2014-05-30 DIAGNOSIS — Z7901 Long term (current) use of anticoagulants: Secondary | ICD-10-CM

## 2014-05-30 LAB — POCT INR: INR: 2.3

## 2014-06-05 ENCOUNTER — Other Ambulatory Visit: Payer: Self-pay

## 2014-06-05 MED ORDER — WARFARIN SODIUM 5 MG PO TABS: ORAL_TABLET | ORAL | Status: DC

## 2014-06-07 ENCOUNTER — Ambulatory Visit (INDEPENDENT_AMBULATORY_CARE_PROVIDER_SITE_OTHER): Payer: Medicare Other | Admitting: Pharmacist

## 2014-06-07 DIAGNOSIS — Z7901 Long term (current) use of anticoagulants: Secondary | ICD-10-CM | POA: Diagnosis not present

## 2014-06-07 DIAGNOSIS — Z5181 Encounter for therapeutic drug level monitoring: Secondary | ICD-10-CM

## 2014-06-07 DIAGNOSIS — I4891 Unspecified atrial fibrillation: Secondary | ICD-10-CM | POA: Diagnosis not present

## 2014-06-07 LAB — POCT INR: INR: 1.3

## 2014-06-16 ENCOUNTER — Ambulatory Visit (INDEPENDENT_AMBULATORY_CARE_PROVIDER_SITE_OTHER): Payer: Medicare Other | Admitting: *Deleted

## 2014-06-16 DIAGNOSIS — Z7901 Long term (current) use of anticoagulants: Secondary | ICD-10-CM | POA: Diagnosis not present

## 2014-06-16 DIAGNOSIS — I4891 Unspecified atrial fibrillation: Secondary | ICD-10-CM

## 2014-06-16 DIAGNOSIS — Z5181 Encounter for therapeutic drug level monitoring: Secondary | ICD-10-CM | POA: Diagnosis not present

## 2014-06-16 LAB — POCT INR: INR: 1.7

## 2014-06-23 ENCOUNTER — Telehealth: Payer: Self-pay | Admitting: Cardiology

## 2014-06-23 NOTE — Telephone Encounter (Signed)
Close encounter 

## 2014-06-30 ENCOUNTER — Ambulatory Visit (INDEPENDENT_AMBULATORY_CARE_PROVIDER_SITE_OTHER): Payer: Medicare Other | Admitting: *Deleted

## 2014-06-30 DIAGNOSIS — I4891 Unspecified atrial fibrillation: Secondary | ICD-10-CM | POA: Diagnosis not present

## 2014-06-30 DIAGNOSIS — Z5181 Encounter for therapeutic drug level monitoring: Secondary | ICD-10-CM | POA: Diagnosis not present

## 2014-06-30 DIAGNOSIS — Z7901 Long term (current) use of anticoagulants: Secondary | ICD-10-CM

## 2014-06-30 LAB — POCT INR: INR: 1.9

## 2014-07-14 ENCOUNTER — Ambulatory Visit (INDEPENDENT_AMBULATORY_CARE_PROVIDER_SITE_OTHER): Payer: Medicare Other | Admitting: *Deleted

## 2014-07-14 DIAGNOSIS — I4891 Unspecified atrial fibrillation: Secondary | ICD-10-CM | POA: Diagnosis not present

## 2014-07-14 DIAGNOSIS — Z7901 Long term (current) use of anticoagulants: Secondary | ICD-10-CM

## 2014-07-14 DIAGNOSIS — Z5181 Encounter for therapeutic drug level monitoring: Secondary | ICD-10-CM

## 2014-07-14 LAB — POCT INR: INR: 2.8

## 2014-07-28 ENCOUNTER — Ambulatory Visit (INDEPENDENT_AMBULATORY_CARE_PROVIDER_SITE_OTHER): Payer: Medicare Other | Admitting: *Deleted

## 2014-07-28 DIAGNOSIS — Z7901 Long term (current) use of anticoagulants: Secondary | ICD-10-CM | POA: Diagnosis not present

## 2014-07-28 DIAGNOSIS — I4891 Unspecified atrial fibrillation: Secondary | ICD-10-CM

## 2014-07-28 DIAGNOSIS — Z5181 Encounter for therapeutic drug level monitoring: Secondary | ICD-10-CM | POA: Diagnosis not present

## 2014-07-28 LAB — POCT INR: INR: 2.1

## 2014-08-04 ENCOUNTER — Other Ambulatory Visit: Payer: Self-pay | Admitting: *Deleted

## 2014-08-04 MED ORDER — POTASSIUM CHLORIDE CRYS ER 10 MEQ PO TBCR: EXTENDED_RELEASE_TABLET | ORAL | Status: DC

## 2014-08-15 ENCOUNTER — Ambulatory Visit (INDEPENDENT_AMBULATORY_CARE_PROVIDER_SITE_OTHER): Payer: Medicare Other | Admitting: Pharmacist Clinician (PhC)/ Clinical Pharmacy Specialist

## 2014-08-15 DIAGNOSIS — Z5181 Encounter for therapeutic drug level monitoring: Secondary | ICD-10-CM | POA: Diagnosis not present

## 2014-08-15 DIAGNOSIS — I4891 Unspecified atrial fibrillation: Secondary | ICD-10-CM

## 2014-08-15 DIAGNOSIS — Z7901 Long term (current) use of anticoagulants: Secondary | ICD-10-CM

## 2014-08-15 LAB — POCT INR: INR: 3.1

## 2014-09-12 ENCOUNTER — Ambulatory Visit (INDEPENDENT_AMBULATORY_CARE_PROVIDER_SITE_OTHER): Payer: Medicare Other | Admitting: *Deleted

## 2014-09-12 DIAGNOSIS — Z7901 Long term (current) use of anticoagulants: Secondary | ICD-10-CM | POA: Diagnosis not present

## 2014-09-12 DIAGNOSIS — I4891 Unspecified atrial fibrillation: Secondary | ICD-10-CM | POA: Diagnosis not present

## 2014-09-12 DIAGNOSIS — Z5181 Encounter for therapeutic drug level monitoring: Secondary | ICD-10-CM

## 2014-09-12 LAB — POCT INR: INR: 4.2

## 2014-09-13 ENCOUNTER — Other Ambulatory Visit: Payer: Self-pay | Admitting: Internal Medicine

## 2014-09-13 ENCOUNTER — Ambulatory Visit
Admission: RE | Admit: 2014-09-13 | Discharge: 2014-09-13 | Disposition: A | Discharge status: Home / Self Care | Payer: Medicare Other | Source: Ambulatory Visit | Attending: Internal Medicine | Admitting: Internal Medicine

## 2014-09-13 DIAGNOSIS — R059 Cough, unspecified: Secondary | ICD-10-CM

## 2014-09-13 DIAGNOSIS — R05 Cough: Secondary | ICD-10-CM

## 2014-09-26 ENCOUNTER — Ambulatory Visit (INDEPENDENT_AMBULATORY_CARE_PROVIDER_SITE_OTHER): Payer: Medicare Other | Admitting: Pharmacist

## 2014-09-26 DIAGNOSIS — I4891 Unspecified atrial fibrillation: Secondary | ICD-10-CM

## 2014-09-26 DIAGNOSIS — Z7901 Long term (current) use of anticoagulants: Secondary | ICD-10-CM

## 2014-09-26 DIAGNOSIS — Z5181 Encounter for therapeutic drug level monitoring: Secondary | ICD-10-CM | POA: Diagnosis not present

## 2014-09-26 LAB — POCT INR: INR: 2

## 2014-09-29 ENCOUNTER — Ambulatory Visit: Payer: Medicare Other | Admitting: Cardiology

## 2014-10-05 ENCOUNTER — Other Ambulatory Visit: Payer: Self-pay | Admitting: Pharmacist Clinician (PhC)/ Clinical Pharmacy Specialist

## 2014-10-05 MED ORDER — WARFARIN SODIUM 5 MG PO TABS: ORAL_TABLET | ORAL | Status: DC

## 2014-10-20 ENCOUNTER — Ambulatory Visit: Payer: Medicare Other | Admitting: Cardiology

## 2014-10-23 ENCOUNTER — Ambulatory Visit (INDEPENDENT_AMBULATORY_CARE_PROVIDER_SITE_OTHER): Payer: Medicare Other | Admitting: Neurology

## 2014-10-23 ENCOUNTER — Encounter: Payer: Self-pay | Admitting: Neurology

## 2014-10-23 ENCOUNTER — Ambulatory Visit (INDEPENDENT_AMBULATORY_CARE_PROVIDER_SITE_OTHER): Payer: Self-pay | Admitting: Neurology

## 2014-10-23 DIAGNOSIS — G609 Hereditary and idiopathic neuropathy, unspecified: Secondary | ICD-10-CM | POA: Diagnosis not present

## 2014-10-23 DIAGNOSIS — Z0289 Encounter for other administrative examinations: Secondary | ICD-10-CM

## 2014-10-23 DIAGNOSIS — M5416 Radiculopathy, lumbar region: Secondary | ICD-10-CM

## 2014-10-23 HISTORY — DX: Hereditary and idiopathic neuropathy, unspecified: G60.9

## 2014-10-23 NOTE — Progress Notes (Signed)
Please refer to EMG and NCV procedure note. 

## 2014-10-23 NOTE — Procedures (Signed)
HISTORY:  Ian Sullivan is an 81 year old gentleman with a history of prior lumbosacral spine surgery. The patient is having ongoing issues with weakness and numbness in the lower extremities. He reports some pain in the pelvic area. He believes that his ability to ambulate has worsened over the last 6 months, he is being reevaluated for this issue.  NERVE CONDUCTION STUDIES:  Nerve conduction studies were performed on both lower extremities. No response was seen for the left peroneal and posterior tibial nerves, the distal motor latencies for the right peroneal nerve was normal but with a low motor amplitude. The distal motor latency for the right posterior tibial nerve was borderline normal, with a low motor amplitude. The nerve conduction velocities for the right peroneal and posterior tibial nerves were normal, with absence of response for the right H reflex latency and for the peroneal sensory latency.  EMG STUDIES:  EMG study was performed on the right lower extremity:  The tibialis anterior muscle reveals 2 to 4K motor units with full recruitment. No fibrillations or positive waves were seen. The peroneus tertius muscle reveals 2 to 4K motor units with full recruitment. No fibrillations or positive waves were seen. The medial gastrocnemius muscle reveals 1 to 4K motor units with slightly decreased  recruitment. No fibrillations or positive waves were seen. The vastus lateralis muscle reveals 2 to 4K motor units with full recruitment. No fibrillations or positive waves were seen. The iliopsoas muscle reveals 2 to 4K motor units with full recruitment. No fibrillations or positive waves were seen. The biceps femoris muscle (long head) reveals 2 to 4K motor units with full recruitment. No fibrillations or positive waves were seen. The lumbosacral paraspinal muscles were tested at 3 levels, and revealed no abnormalities of insertional activity at the upper level tested. 1+ fibrillations  were seen at the mid-level, 2+ at the lower level. There was good relaxation.  EMG study was performed on the left lower extremity:  The tibialis anterior muscle reveals 2 to 4K motor units with full recruitment. No fibrillations or positive waves were seen. The peroneus tertius muscle reveals 2 to 3K motor units with decreased recruitment. 2+ fibrillations and positive waves were seen. The medial gastrocnemius muscle reveals 1 to 3K motor units with moderately decreased recruitment. 3+ fibrillations and positive waves were seen. The vastus lateralis muscle reveals 2 to 4K motor units with full recruitment. No fibrillations or positive waves were seen. The iliopsoas muscle reveals 2 to 4K motor units with full recruitment. No fibrillations or positive waves were seen. The biceps femoris muscle (long head) reveals 2 to 4K motor units with full recruitment. No fibrillations or positive waves were seen. The lumbosacral paraspinal muscles were tested at 3 levels, and revealed no abnormalities of insertional activity at the upper level tested. 2+ fibrillations were seen at the mid-level, 3+ at the lower level. There was good relaxation.   IMPRESSION:  Nerve conduction studies done on both lower extremities shows evidence of a severe primarily axonal peripheral neuropathy. EMG evaluation of the right lower extremity shows mild acute changes consistent with an overlying S1 radiculopathy. EMG evaluation of the left lower extremity shows severe acute changes consistent with an overlying S1 radiculopathy. In comparison to a prior study that was done on 04/26/2014, the nerve conduction studies done today suggest a significant progression of the peripheral neuropathy, but this is not supported by EMG findings. The patient has significant edema in both lower extremities which may be affecting the  nerve conduction study responses. EMG evaluation of the right lower extremity continues to show some denervation in the  S1 nerve root distribution, similar to what was seen previously, possibly slightly improved. EMG evaluation of the left lower extremity shows improvement in acute denervation seen in the L5 nerve root distribution, but ongoing severe denervation in the S1 nerve or distribution.  Jill Alexanders MD 10/23/2014 1:02 PM  [facility] [address] [place], [place] [address]  Phone [phone] Fax [phone]

## 2014-10-23 NOTE — Progress Notes (Signed)
Please refer to EMG and nerve conduction study procedure note. 

## 2014-10-30 ENCOUNTER — Other Ambulatory Visit: Payer: Self-pay

## 2014-10-30 MED ORDER — AMLODIPINE BESYLATE 5 MG PO TABS: ORAL_TABLET | ORAL | Status: DC

## 2014-10-30 NOTE — Telephone Encounter (Signed)
Lelon Perla, MD at 09/01/2013 6:16 AM  amLODipine (NORVASC) 5 MG tablet TAKE 1 TABLET BY MOUTH EVERY DAY         HYPERTENSION - Lelon Perla, MD at 09/01/2013 3:15 PM     Status: Written Related Problem: HYPERTENSION   Expand All Collapse All   Blood pressure controlled. Continue present medications

## 2014-11-03 ENCOUNTER — Telehealth: Payer: Self-pay | Admitting: *Deleted

## 2014-11-03 NOTE — Telephone Encounter (Signed)
Patient called and stated that he needed to know his appointment date.  Advised that he had missed the last scheduled [facility] appointment.  Tried to reschedule appointment and patient stated he would wait until he had his mylegram scheduled.  Advised that we need to get clearance to hold his Warfarin/Coumadin and the recommended days from the doctor doing the procedure.  He verbalized understanding and stated that he has done this before and knows that the doctor will need to get permission from Cardiologist to hold medication and that he knew he would need to come into the [facility] prior to procedure.  Advised to call back with any issues and scheduled procedure date.

## 2014-11-09 ENCOUNTER — Other Ambulatory Visit: Payer: Self-pay | Admitting: Specialist

## 2014-11-09 DIAGNOSIS — M545 Low back pain: Principal | ICD-10-CM

## 2014-11-09 DIAGNOSIS — G8929 Other chronic pain: Secondary | ICD-10-CM

## 2014-11-13 ENCOUNTER — Other Ambulatory Visit: Payer: Self-pay | Admitting: *Deleted

## 2014-11-13 MED ORDER — AMLODIPINE BESYLATE 5 MG PO TABS: ORAL_TABLET | ORAL | Status: DC

## 2014-11-13 NOTE — Progress Notes (Signed)
HPI: FU permanent atrial fibrillation. Last echo in May of 2011 showed normal LV function, mild LVH, mild MR and trace AI; mild biatrial enlargement. Since last seen, patient notes some increased dyspnea on exertion and wheeze. Mild increased pedal edema. No chest pain, palpitations, syncope or bleeding.  Current Outpatient Prescriptions  Medication Sig Dispense Refill  . amLODipine (NORVASC) 5 MG tablet TAKE 1 TABLET BY MOUTH EVERY DAY 18 tablet 0  . Calcium 600 MG tablet Take 2 tablets by mouth 2 (two) times daily. Every other day    . carvedilol (COREG) 12.5 MG tablet TAKE 1 TABLET BY MOUTH TWICE DAILY WITH A MEAL 180 tablet 3  . cholecalciferol (VITAMIN D) 1000 UNITS tablet Take 1,000 Units by mouth daily.      . Ferrous Sulfate (IRON) 325 (65 FE) MG TABS Take 1 tablet by mouth daily.      . fish oil-omega-3 fatty acids 1000 MG capsule Take 2 capsules by mouth 2 (two) times daily.     Marland Kitchen FLUoxetine (PROZAC) 20 MG tablet Take 20 mg by mouth daily.    Marland Kitchen losartan-hydrochlorothiazide (HYZAAR) 100-12.5 MG per tablet Take 1 tablet by mouth daily.      . Magnesium 100 MG CAPS Take 1 capsule by mouth daily.    . Multiple Vitamin (MULTIVITAMIN) tablet Take 1 tablet by mouth daily.    Marland Kitchen omeprazole (PRILOSEC) 20 MG capsule Take 20 mg by mouth daily.      Marland Kitchen oxyCODONE-acetaminophen (PERCOCET) 7.5-325 MG per tablet Take 1 tablet by mouth every 4 (four) hours as needed.      . potassium chloride (KLOR-CON M10) 10 MEQ tablet TAKE 1 TABLET BY MOUTH EVERY DAY 30 tablet 3  . pravastatin (PRAVACHOL) 10 MG tablet Take 10 mg by mouth daily.      . vitamin E 400 UNIT capsule Take 400 Units by mouth daily.      Marland Kitchen warfarin (COUMADIN) 5 MG tablet Take 1-1.5 tablets by mouth daily as directed by coumadin clinic 40 tablet 3   No current facility-administered medications for this visit.     Past Medical History  Diagnosis Date  . Unspecified essential hypertension   . Anxiety state, unspecified     past  at death of spouse  . Depressive disorder, not elsewhere classified     past at death of spouse  . Other and unspecified hyperlipidemia   . Dyskinesia of esophagus   . Long term (current) use of anticoagulants   . Hiatal hernia   . Duodenitis without mention of hemorrhage   . Unspecified gastritis and gastroduodenitis without mention of hemorrhage   . Unspecified hemorrhoids without mention of complication   . Diverticulosis of colon (without mention of hemorrhage)   . Barrett's esophagus   . Irritable bowel syndrome   . Unspecified sleep apnea   . Anemia   . Atrial fibrillation   . Skin cancer (melanoma)   . Status post dilation of esophageal narrowing   . GERD (gastroesophageal reflux disease)   . Hereditary and idiopathic peripheral neuropathy 10/23/2014    Past Surgical History  Procedure Laterality Date  . Nissen fundoplication  1308  . Turp vaporization  1980s  . Carpal tunnel release  2003    bilateral  . Appendectomy  1945  . Tonsillectomy    . Vasectomy    . Lumbar laminectomy    . Small bowel obsturction      x4  . Cardiac catheterization  1991  .  Central decompressive laminectomy      L2-L5  . Esophagogastroduodenoscopy    . Colonoscopy    . Spinal cord stimulator implant  06/2011    Dr. Anselm Lis  . Eus  11/13/2011    Procedure: LOWER ENDOSCOPIC ULTRASOUND (EUS);  Surgeon: Milus Banister, MD;  Location: [facility];  Service: Endoscopy;  Laterality: N/A;    Social History   Social History  . Marital Status: Widowed    Spouse Name: N/A  . Number of Children: 1  . Years of Education: N/A   Occupational History  . retired    Social History Main Topics  . Smoking status: Former Smoker    Quit date: 03/31/1972  . Smokeless tobacco: Never Used  . Alcohol Use: 0.6 oz/week    1 Shots of liquor per week     Comment: ocass  . Drug Use: No  . Sexual Activity: Not on file   Other Topics Concern  . Not on file   Social History Narrative   Alcohol use-  yes occasional    ROS: no fevers or chills, productive cough, hemoptysis, dysphasia, odynophagia, melena, hematochezia, dysuria, hematuria, rash, seizure activity, orthopnea, PND, claudication. Remaining systems are negative.  Physical Exam: Well-developed well-nourished in no acute distress.  Skin is warm and dry.  HEENT is normal.  Neck is supple.  Chest is clear to auscultation with normal expansion.  Cardiovascular exam is bradycardic and irregular Abdominal exam nontender or distended. No masses palpated. Extremities show 2+ edema. neuro grossly intact  ECG atrial fibrillation with a slow ventricular response. Normal axis. RV conduction delay. No ST changes.

## 2014-11-16 ENCOUNTER — Encounter: Payer: Self-pay | Admitting: *Deleted

## 2014-11-16 ENCOUNTER — Encounter: Payer: Self-pay | Admitting: Cardiology

## 2014-11-16 ENCOUNTER — Ambulatory Visit (INDEPENDENT_AMBULATORY_CARE_PROVIDER_SITE_OTHER): Payer: Medicare Other | Admitting: Cardiology

## 2014-11-16 VITALS — BP 130/66 | HR 47 | Ht 71.5 in | Wt 191.6 lb

## 2014-11-16 DIAGNOSIS — I4891 Unspecified atrial fibrillation: Secondary | ICD-10-CM | POA: Diagnosis not present

## 2014-11-16 DIAGNOSIS — R0600 Dyspnea, unspecified: Secondary | ICD-10-CM | POA: Diagnosis not present

## 2014-11-16 MED ORDER — LOSARTAN POTASSIUM 100 MG PO TABS: 100.0000 mg | ORAL_TABLET | Freq: Every day | ORAL | Status: DC

## 2014-11-16 MED ORDER — FUROSEMIDE 20 MG PO TABS: 20.0000 mg | ORAL_TABLET | Freq: Every day | ORAL | Status: DC

## 2014-11-16 MED ORDER — CARVEDILOL 6.25 MG PO TABS: ORAL_TABLET | ORAL | Status: DC

## 2014-11-16 NOTE — Assessment & Plan Note (Signed)
Continue present blood pressure medications. 

## 2014-11-16 NOTE — Assessment & Plan Note (Signed)
Patient is bradycardic today. Decrease carvedilol to 6.25 mg by mouth twice a day. In one week check 24-hour Holter monitor. If heart rate remains decreased we may need to discontinue carvedilol altogether. Continue Coumadin. Check hemoglobin.

## 2014-11-16 NOTE — Assessment & Plan Note (Signed)
Continue statin. 

## 2014-11-16 NOTE — Patient Instructions (Signed)
Your physician recommends that you schedule a follow-up appointment in: [place] CARVEDILOL TO 6.25 MG TWICE DAILY= 1/2 OF THE 1`2.5 MG TABLETS TWICE DAILY  Your physician has requested that you have an echocardiogram. Echocardiography is a painless test that uses sound waves to create images of your heart. It provides your doctor with information about the size and shape of your heart and how well your heart's chambers and valves are working. This procedure takes approximately one hour. There are no restrictions for this procedure.   Your physician has recommended that you wear a 24 HOUR holter monitor. Holter monitors are medical devices that record the heart's electrical activity. Doctors most often use these monitors to diagnose arrhythmias. Arrhythmias are problems with the speed or rhythm of the heartbeat. The monitor is a small, portable device. You can wear one while you do your normal daily activities. This is usually used to diagnose what is causing palpitations/syncope (passing out). SCHEDULE IN ONE WEEK  STOP LOSARTAN/HCTZ  START LOSARTAN 100 MG ONCE DAILY  START FUROSEMIDE 20 MG ONCE DAILY  Your physician recommends that you return for lab work in: [place]

## 2014-11-16 NOTE — Assessment & Plan Note (Signed)
Patient notes increased dyspnea. He appears to be volume overloaded on examination. Repeat echocardiogram to assess LV function. Discontinue HCTZ. Begin Lasix 20 mg daily. In 1 week check potassium, renal function and BNP. Hopefully his symptoms will improve with more vigorous diuresis. Also improving his bradycardia may improve his cardiac output and congestive heart failure symptoms.

## 2014-11-17 LAB — BASIC METABOLIC PANEL
BUN: 21 mg/dL (ref 7–25)
CHLORIDE: 106 mmol/L (ref 98–110)
CO2: 26 mmol/L (ref 20–31)
CREATININE: 0.83 mg/dL (ref 0.70–1.11)
Calcium: 9.2 mg/dL (ref 8.6–10.3)
GLUCOSE: 85 mg/dL (ref 65–99)
Potassium: 4.2 mmol/L (ref 3.5–5.3)
Sodium: 139 mmol/L (ref 135–146)

## 2014-11-17 LAB — CBC
HEMATOCRIT: 36.2 % — AB (ref 39.0–52.0)
HEMOGLOBIN: 12.6 g/dL — AB (ref 13.0–17.0)
MCH: 32.1 pg (ref 26.0–34.0)
MCHC: 34.8 g/dL (ref 30.0–36.0)
MCV: 92.1 fL (ref 78.0–100.0)
MPV: 9.7 fL (ref 8.6–12.4)
Platelets: 132 10*3/uL — ABNORMAL LOW (ref 150–400)
RBC: 3.93 MIL/uL — AB (ref 4.22–5.81)
RDW: 15.5 % (ref 11.5–15.5)
WBC: 3.9 10*3/uL — ABNORMAL LOW (ref 4.0–10.5)

## 2014-11-18 LAB — BRAIN NATRIURETIC PEPTIDE: Brain Natriuretic Peptide: 388.1 pg/mL — ABNORMAL HIGH (ref 0.0–100.0)

## 2014-11-20 ENCOUNTER — Ambulatory Visit (INDEPENDENT_AMBULATORY_CARE_PROVIDER_SITE_OTHER): Payer: Medicare Other | Admitting: *Deleted

## 2014-11-20 ENCOUNTER — Telehealth: Payer: Self-pay | Admitting: *Deleted

## 2014-11-20 ENCOUNTER — Ambulatory Visit
Admission: RE | Admit: 2014-11-20 | Discharge: 2014-11-20 | Disposition: A | Discharge status: Home / Self Care | Payer: Medicare Other | Source: Ambulatory Visit | Attending: Specialist | Admitting: Specialist

## 2014-11-20 DIAGNOSIS — I4891 Unspecified atrial fibrillation: Secondary | ICD-10-CM | POA: Diagnosis not present

## 2014-11-20 DIAGNOSIS — Z5181 Encounter for therapeutic drug level monitoring: Secondary | ICD-10-CM | POA: Diagnosis not present

## 2014-11-20 DIAGNOSIS — M545 Low back pain, unspecified: Secondary | ICD-10-CM

## 2014-11-20 DIAGNOSIS — G8929 Other chronic pain: Secondary | ICD-10-CM

## 2014-11-20 DIAGNOSIS — I482 Chronic atrial fibrillation, unspecified: Secondary | ICD-10-CM

## 2014-11-20 DIAGNOSIS — Z7901 Long term (current) use of anticoagulants: Secondary | ICD-10-CM | POA: Diagnosis not present

## 2014-11-20 DIAGNOSIS — I5031 Acute diastolic (congestive) heart failure: Secondary | ICD-10-CM

## 2014-11-20 LAB — POCT INR: INR: 1.4

## 2014-11-20 MED ORDER — DIAZEPAM 5 MG PO TABS
10.0000 mg | ORAL_TABLET | Freq: Once | ORAL | Status: AC
  Administered 2014-11-20: 5 mg via ORAL

## 2014-11-20 MED ORDER — ONDANSETRON HCL 4 MG/2ML IJ SOLN: 4.0000 mg | Freq: Four times a day (QID) | INTRAMUSCULAR | Status: DC | PRN

## 2014-11-20 MED ORDER — IOHEXOL 180 MG/ML  SOLN
15.0000 mL | Freq: Once | INTRAMUSCULAR | Status: DC | PRN
  Administered 2014-11-20: 15 mL via INTRATHECAL

## 2014-11-20 NOTE — Discharge Instructions (Signed)
Myelogram Discharge Instructions  1. Go home and rest quietly for the next 24 hours.  It is important to lie flat for the next 24 hours.  Get up only to go to the restroom.  You may lie in the bed or on a couch on your back, your stomach, your left side or your right side.  You may have one pillow under your head.  You may have pillows between your knees while you are on your side or under your knees while you are on your back.  2. DO NOT drive today.  Recline the seat as far back as it will go, while still wearing your seat belt, on the way home.  3. You may get up to go to the bathroom as needed.  You may sit up for 10 minutes to eat.  You may resume your normal diet and medications unless otherwise indicated.  Drink lots of extra fluids today and tomorrow.  4. The incidence of headache, nausea, or vomiting is about 5% (one in 20 patients).  If you develop a headache, lie flat and drink plenty of fluids until the headache goes away.  Caffeinated beverages may be helpful.  If you develop severe nausea and vomiting or a headache that does not go away with flat bed rest, call [phone].  5. You may resume normal activities after your 24 hours of bed rest is over; however, do not exert yourself strongly or do any heavy lifting tomorrow. If when you get up you have a headache when standing, go back to bed and force fluids for another 24 hours.  6. Call your physician for a follow-up appointment.  The results of your myelogram will be sent directly to your physician by the following day.  7. If you have any questions or if complications develop after you arrive home, please call [phone].  Discharge instructions have been explained to the patient.  The patient, or the person responsible for the patient, fully understands these instructions.         May resume Warfarin today.   May resume Prozac on Aug. 23, 2016, after 1:00 pm.

## 2014-11-20 NOTE — Telephone Encounter (Signed)
Spoke with pt, Aware of dr Jacalyn Lefevre recommendations.  Lab orders mailed to the patient

## 2014-11-20 NOTE — Progress Notes (Signed)
Patient states he has been off Coumadin for at least the past five days and Prozac for at least the past two days.

## 2014-11-20 NOTE — Telephone Encounter (Signed)
-----   Message from Lelon Perla, MD sent at 11/19/2014  7:29 AM EDT ----- bmet one week after starting lasix Kirk Ruths

## 2014-11-21 ENCOUNTER — Ambulatory Visit (INDEPENDENT_AMBULATORY_CARE_PROVIDER_SITE_OTHER): Payer: Medicare Other

## 2014-11-21 ENCOUNTER — Other Ambulatory Visit: Payer: Self-pay

## 2014-11-21 ENCOUNTER — Ambulatory Visit (HOSPITAL_COMMUNITY): Payer: Medicare Other | Attending: Cardiovascular Disease

## 2014-11-21 DIAGNOSIS — G4733 Obstructive sleep apnea (adult) (pediatric): Secondary | ICD-10-CM | POA: Insufficient documentation

## 2014-11-21 DIAGNOSIS — I10 Essential (primary) hypertension: Secondary | ICD-10-CM | POA: Insufficient documentation

## 2014-11-21 DIAGNOSIS — I351 Nonrheumatic aortic (valve) insufficiency: Secondary | ICD-10-CM | POA: Diagnosis not present

## 2014-11-21 DIAGNOSIS — I4891 Unspecified atrial fibrillation: Secondary | ICD-10-CM

## 2014-11-21 DIAGNOSIS — Z87891 Personal history of nicotine dependence: Secondary | ICD-10-CM | POA: Insufficient documentation

## 2014-11-21 DIAGNOSIS — I071 Rheumatic tricuspid insufficiency: Secondary | ICD-10-CM | POA: Diagnosis not present

## 2014-11-21 DIAGNOSIS — E785 Hyperlipidemia, unspecified: Secondary | ICD-10-CM | POA: Insufficient documentation

## 2014-11-21 DIAGNOSIS — I340 Nonrheumatic mitral (valve) insufficiency: Secondary | ICD-10-CM | POA: Insufficient documentation

## 2014-11-21 DIAGNOSIS — I517 Cardiomegaly: Secondary | ICD-10-CM | POA: Insufficient documentation

## 2014-11-21 DIAGNOSIS — Z8249 Family history of ischemic heart disease and other diseases of the circulatory system: Secondary | ICD-10-CM | POA: Diagnosis not present

## 2014-11-28 ENCOUNTER — Telehealth: Payer: Self-pay | Admitting: *Deleted

## 2014-11-28 LAB — BASIC METABOLIC PANEL
BUN: 26 mg/dL — AB (ref 7–25)
CO2: 25 mmol/L (ref 20–31)
CREATININE: 0.74 mg/dL (ref 0.70–1.11)
Calcium: 9.3 mg/dL (ref 8.6–10.3)
Chloride: 106 mmol/L (ref 98–110)
GLUCOSE: 82 mg/dL (ref 65–99)
Potassium: 4.5 mmol/L (ref 3.5–5.3)
Sodium: 141 mmol/L (ref 135–146)

## 2014-11-28 NOTE — Telephone Encounter (Signed)
-----   Message from Lelon Perla, MD sent at 11/27/2014  8:34 AM EDT ----- Decrease coreg to 6.25 mg po BID Kirk Ruths

## 2014-11-28 NOTE — Telephone Encounter (Signed)
pt aware of results  Patient voiced understanding to stop carvedilol

## 2014-11-29 ENCOUNTER — Ambulatory Visit (INDEPENDENT_AMBULATORY_CARE_PROVIDER_SITE_OTHER): Payer: Medicare Other | Admitting: *Deleted

## 2014-11-29 DIAGNOSIS — Z7901 Long term (current) use of anticoagulants: Secondary | ICD-10-CM | POA: Diagnosis not present

## 2014-11-29 DIAGNOSIS — Z5181 Encounter for therapeutic drug level monitoring: Secondary | ICD-10-CM

## 2014-11-29 DIAGNOSIS — I4891 Unspecified atrial fibrillation: Secondary | ICD-10-CM | POA: Diagnosis not present

## 2014-11-29 DIAGNOSIS — I482 Chronic atrial fibrillation, unspecified: Secondary | ICD-10-CM

## 2014-11-29 LAB — POCT INR: INR: 2.3

## 2014-11-30 ENCOUNTER — Other Ambulatory Visit: Payer: Self-pay | Admitting: Cardiology

## 2014-12-12 ENCOUNTER — Telehealth: Payer: Self-pay | Admitting: *Deleted

## 2014-12-12 NOTE — Telephone Encounter (Signed)
Pt is needing clearance to hold coumadin for 5 days prior to spinal epidural steroid injection. Will forward to pharm md for clearance

## 2014-12-13 NOTE — Telephone Encounter (Signed)
Per Protocol, CHADS2 score of 4 in patient with AF. Ok to hold warfarin x 5 days for procedure.         Tommy Medal PharmD CPP    [facility]     WILL FAX NOTE TO THE NUMBER PROVIDED

## 2014-12-20 ENCOUNTER — Ambulatory Visit (INDEPENDENT_AMBULATORY_CARE_PROVIDER_SITE_OTHER): Payer: Medicare Other | Admitting: *Deleted

## 2014-12-20 DIAGNOSIS — Z5181 Encounter for therapeutic drug level monitoring: Secondary | ICD-10-CM

## 2014-12-20 DIAGNOSIS — Z7901 Long term (current) use of anticoagulants: Secondary | ICD-10-CM | POA: Diagnosis not present

## 2014-12-20 DIAGNOSIS — I4891 Unspecified atrial fibrillation: Secondary | ICD-10-CM | POA: Diagnosis not present

## 2014-12-20 DIAGNOSIS — I482 Chronic atrial fibrillation, unspecified: Secondary | ICD-10-CM

## 2014-12-20 LAB — POCT INR: INR: 2.6

## 2014-12-28 ENCOUNTER — Other Ambulatory Visit: Payer: Self-pay | Admitting: *Deleted

## 2014-12-28 MED ORDER — PRAVASTATIN SODIUM 10 MG PO TABS: 10.0000 mg | ORAL_TABLET | Freq: Every day | ORAL | Status: DC

## 2015-01-01 ENCOUNTER — Ambulatory Visit (INDEPENDENT_AMBULATORY_CARE_PROVIDER_SITE_OTHER): Payer: Medicare Other

## 2015-01-01 DIAGNOSIS — Z5181 Encounter for therapeutic drug level monitoring: Secondary | ICD-10-CM | POA: Diagnosis not present

## 2015-01-01 DIAGNOSIS — I4891 Unspecified atrial fibrillation: Secondary | ICD-10-CM

## 2015-01-01 DIAGNOSIS — I482 Chronic atrial fibrillation, unspecified: Secondary | ICD-10-CM

## 2015-01-01 DIAGNOSIS — Z7901 Long term (current) use of anticoagulants: Secondary | ICD-10-CM | POA: Diagnosis not present

## 2015-01-01 LAB — POCT INR: INR: 1.3

## 2015-01-03 ENCOUNTER — Other Ambulatory Visit: Payer: Self-pay | Admitting: Cardiology

## 2015-01-03 MED ORDER — POTASSIUM CHLORIDE CRYS ER 10 MEQ PO TBCR: EXTENDED_RELEASE_TABLET | ORAL | Status: DC

## 2015-01-12 ENCOUNTER — Encounter: Payer: Self-pay | Admitting: *Deleted

## 2015-01-15 ENCOUNTER — Ambulatory Visit (INDEPENDENT_AMBULATORY_CARE_PROVIDER_SITE_OTHER): Payer: Medicare Other | Admitting: *Deleted

## 2015-01-15 DIAGNOSIS — I482 Chronic atrial fibrillation, unspecified: Secondary | ICD-10-CM

## 2015-01-15 DIAGNOSIS — I4891 Unspecified atrial fibrillation: Secondary | ICD-10-CM

## 2015-01-15 DIAGNOSIS — Z5181 Encounter for therapeutic drug level monitoring: Secondary | ICD-10-CM | POA: Diagnosis not present

## 2015-01-15 DIAGNOSIS — Z7901 Long term (current) use of anticoagulants: Secondary | ICD-10-CM | POA: Diagnosis not present

## 2015-01-15 LAB — POCT INR: INR: 2.5

## 2015-01-15 NOTE — Progress Notes (Signed)
HPI: FU permanent atrial fibrillation. Last echo in August 2016 showed normal LV function, elevated left ventricular filling pressures, mild aortic and mitral regurgitation, severe left atrial enlargement, moderate right atrial enlargement, mild right ventricular enlargement and moderate tricuspid regurgitation. At last office visit patient noted increased dyspnea. Holter monitor August 2016 showed A. Fib with rate decreased. Carvedilol discontinued. BNP 388. CBC showed white blood cell count 3.9, hemoglobin 12.6 and platelets 132. Started on lasix. Since last seen, History dyspnea on exertion has improved. He denies chest pain, palpitations or syncope. He continues to have pedal edema but improving. He is only taking 10 mg of Lasix daily.  Current Outpatient Prescriptions  Medication Sig Dispense Refill  . amLODipine (NORVASC) 5 MG tablet TAKE 1 TABLET BY MOUTH EVERY DAY 90 tablet 1  . Calcium 600 MG tablet Take 2 tablets by mouth 2 (two) times daily. Every other day    . cholecalciferol (VITAMIN D) 1000 UNITS tablet Take 1,000 Units by mouth daily.      . Ferrous Sulfate (IRON) 325 (65 FE) MG TABS Take 1 tablet by mouth daily.      . fish oil-omega-3 fatty acids 1000 MG capsule Take 2 capsules by mouth 2 (two) times daily.     Marland Kitchen FLUoxetine (PROZAC) 20 MG tablet Take 20 mg by mouth daily.    . furosemide (LASIX) 20 MG tablet Take 1 tablet (20 mg total) by mouth daily. 90 tablet 3  . losartan (COZAAR) 100 MG tablet Take 1 tablet (100 mg total) by mouth daily. 90 tablet 3  . Magnesium 100 MG CAPS Take 1 capsule by mouth daily.    . Multiple Vitamin (MULTIVITAMIN) tablet Take 1 tablet by mouth daily.    Marland Kitchen omeprazole (PRILOSEC) 20 MG capsule Take 20 mg by mouth daily.      Marland Kitchen oxyCODONE-acetaminophen (PERCOCET) 7.5-325 MG per tablet Take 1 tablet by mouth every 4 (four) hours as needed.      . potassium chloride (KLOR-CON M10) 10 MEQ tablet TAKE 1 TABLET BY MOUTH EVERY DAY 30 tablet 3  .  pravastatin (PRAVACHOL) 10 MG tablet Take 1 tablet (10 mg total) by mouth daily. 30 tablet 3  . vitamin E 400 UNIT capsule Take 400 Units by mouth daily.      Marland Kitchen warfarin (COUMADIN) 5 MG tablet Take 1-1.5 tablets by mouth daily as directed by coumadin clinic 40 tablet 3   No current facility-administered medications for this visit.     Past Medical History  Diagnosis Date  . Unspecified essential hypertension   . Anxiety state, unspecified     past at death of spouse  . Depressive disorder, not elsewhere classified     past at death of spouse  . Other and unspecified hyperlipidemia   . Dyskinesia of esophagus   . Long term (current) use of anticoagulants   . Hiatal hernia   . Duodenitis without mention of hemorrhage   . Unspecified gastritis and gastroduodenitis without mention of hemorrhage   . Unspecified hemorrhoids without mention of complication   . Diverticulosis of colon (without mention of hemorrhage)   . Barrett's esophagus   . Irritable bowel syndrome   . Unspecified sleep apnea   . Anemia   . Atrial fibrillation ([place])   . Skin cancer (melanoma) ([place])   . Status post dilation of esophageal narrowing   . GERD (gastroesophageal reflux disease)   . Hereditary and idiopathic peripheral neuropathy 10/23/2014    Past Surgical  History  Procedure Laterality Date  . Nissen fundoplication  2130  . Turp vaporization  1980s  . Carpal tunnel release Bilateral 2003  . Appendectomy  1945  . Tonsillectomy    . Vasectomy    . Lumbar laminectomy    . Small bowel obsturction      x4  . Cardiac catheterization  1991  . Central decompressive laminectomy      L2-L5  . Esophagogastroduodenoscopy    . Colonoscopy    . Spinal cord stimulator implant  06/2011    Dr. Anselm Lis  . Eus  11/13/2011    Procedure: LOWER ENDOSCOPIC ULTRASOUND (EUS);  Surgeon: Milus Banister, MD;  Location: [facility];  Service: Endoscopy;  Laterality: N/A;    Social History   Social History  .  Marital Status: Widowed    Spouse Name: N/A  . Number of Children: 1  . Years of Education: N/A   Occupational History  . retired    Social History Main Topics  . Smoking status: Former Smoker    Quit date: 03/31/1972  . Smokeless tobacco: Never Used  . Alcohol Use: 0.6 oz/week    1 Shots of liquor per week     Comment: ocass  . Drug Use: No  . Sexual Activity: Not on file   Other Topics Concern  . Not on file   Social History Narrative   Alcohol use- yes occasional    ROS: no fevers or chills, productive cough, hemoptysis, dysphasia, odynophagia, melena, hematochezia, dysuria, hematuria, rash, seizure activity, orthopnea, PND, claudication. Remaining systems are negative.  Physical Exam: Well-developed well-nourished in no acute distress.  Skin is warm and dry.  HEENT is normal.  Neck is supple.  Chest is clear to auscultation with normal expansion.  Cardiovascular exam is irregular Abdominal exam nontender or distended. No masses palpated. Extremities show 1+ edema. neuro grossly intact

## 2015-01-16 ENCOUNTER — Ambulatory Visit (INDEPENDENT_AMBULATORY_CARE_PROVIDER_SITE_OTHER): Payer: Medicare Other | Admitting: Cardiology

## 2015-01-16 ENCOUNTER — Encounter: Payer: Self-pay | Admitting: Cardiology

## 2015-01-16 VITALS — BP 160/80 | HR 60 | Ht 71.0 in | Wt 184.3 lb

## 2015-01-16 DIAGNOSIS — I4891 Unspecified atrial fibrillation: Secondary | ICD-10-CM

## 2015-01-16 DIAGNOSIS — D696 Thrombocytopenia, unspecified: Secondary | ICD-10-CM | POA: Diagnosis not present

## 2015-01-16 NOTE — Assessment & Plan Note (Signed)
Patient remains in atrial fibrillation. Rate is now controlled on no medications. Continue Coumadin.

## 2015-01-16 NOTE — Assessment & Plan Note (Signed)
Blood pressure elevated. Increase amlodipine to 10 mg daily. 

## 2015-01-16 NOTE — Assessment & Plan Note (Signed)
Patient's volume excess has improved. I have asked him to take Lasix 20 mg daily. Check potassium and renal function in 1 week.

## 2015-01-16 NOTE — Assessment & Plan Note (Signed)
Since this showed mild pancytopenia. Plan repeat CBC. If pancytopenia persists will ask him to follow-up with primary care and may need hematology evaluation.

## 2015-01-16 NOTE — Patient Instructions (Signed)
Medication Instructions:   TAKE FUROSEMIDE 20 MG EVERYDAY  Labwork:  Your physician recommends that you return for lab work in: [place]:  Your physician recommends that you schedule a follow-up appointment in: [place]

## 2015-01-16 NOTE — Assessment & Plan Note (Signed)
Continue statin. 

## 2015-01-23 ENCOUNTER — Other Ambulatory Visit: Payer: Self-pay | Admitting: *Deleted

## 2015-01-23 LAB — BASIC METABOLIC PANEL
BUN: 28 mg/dL — AB (ref 7–25)
CALCIUM: 9.3 mg/dL (ref 8.6–10.3)
CO2: 30 mmol/L (ref 20–31)
CREATININE: 0.74 mg/dL (ref 0.70–1.11)
Chloride: 103 mmol/L (ref 98–110)
Glucose, Bld: 85 mg/dL (ref 65–99)
Potassium: 4.1 mmol/L (ref 3.5–5.3)
Sodium: 141 mmol/L (ref 135–146)

## 2015-01-23 LAB — CBC
HCT: 41.4 % (ref 39.0–52.0)
Hemoglobin: 14.3 g/dL (ref 13.0–17.0)
MCH: 32.3 pg (ref 26.0–34.0)
MCHC: 34.5 g/dL (ref 30.0–36.0)
MCV: 93.5 fL (ref 78.0–100.0)
MPV: 9.3 fL (ref 8.6–12.4)
PLATELETS: 190 10*3/uL (ref 150–400)
RBC: 4.43 MIL/uL (ref 4.22–5.81)
RDW: 15.1 % (ref 11.5–15.5)
WBC: 4.6 10*3/uL (ref 4.0–10.5)

## 2015-01-23 MED ORDER — AMLODIPINE BESYLATE 10 MG PO TABS: 10.0000 mg | ORAL_TABLET | Freq: Every day | ORAL | Status: DC

## 2015-02-12 ENCOUNTER — Ambulatory Visit (INDEPENDENT_AMBULATORY_CARE_PROVIDER_SITE_OTHER): Payer: Medicare Other | Admitting: *Deleted

## 2015-02-12 DIAGNOSIS — I482 Chronic atrial fibrillation, unspecified: Secondary | ICD-10-CM

## 2015-02-12 DIAGNOSIS — Z7901 Long term (current) use of anticoagulants: Secondary | ICD-10-CM

## 2015-02-12 DIAGNOSIS — I4891 Unspecified atrial fibrillation: Secondary | ICD-10-CM | POA: Diagnosis not present

## 2015-02-12 DIAGNOSIS — Z5181 Encounter for therapeutic drug level monitoring: Secondary | ICD-10-CM | POA: Diagnosis not present

## 2015-02-12 LAB — POCT INR: INR: 1.5

## 2015-02-26 ENCOUNTER — Ambulatory Visit (INDEPENDENT_AMBULATORY_CARE_PROVIDER_SITE_OTHER): Payer: Medicare Other | Admitting: *Deleted

## 2015-02-26 ENCOUNTER — Other Ambulatory Visit: Payer: Self-pay | Admitting: *Deleted

## 2015-02-26 DIAGNOSIS — Z5181 Encounter for therapeutic drug level monitoring: Secondary | ICD-10-CM | POA: Diagnosis not present

## 2015-02-26 DIAGNOSIS — Z7901 Long term (current) use of anticoagulants: Secondary | ICD-10-CM | POA: Diagnosis not present

## 2015-02-26 DIAGNOSIS — I4891 Unspecified atrial fibrillation: Secondary | ICD-10-CM

## 2015-02-26 DIAGNOSIS — I482 Chronic atrial fibrillation, unspecified: Secondary | ICD-10-CM

## 2015-02-26 LAB — POCT INR: INR: 2.1

## 2015-02-26 MED ORDER — WARFARIN SODIUM 5 MG PO TABS: ORAL_TABLET | ORAL | Status: DC

## 2015-03-09 ENCOUNTER — Encounter: Payer: Self-pay | Admitting: Cardiology

## 2015-03-19 ENCOUNTER — Ambulatory Visit (INDEPENDENT_AMBULATORY_CARE_PROVIDER_SITE_OTHER): Payer: Medicare Other

## 2015-03-19 DIAGNOSIS — I482 Chronic atrial fibrillation, unspecified: Secondary | ICD-10-CM

## 2015-03-19 DIAGNOSIS — Z7901 Long term (current) use of anticoagulants: Secondary | ICD-10-CM | POA: Diagnosis not present

## 2015-03-19 DIAGNOSIS — Z5181 Encounter for therapeutic drug level monitoring: Secondary | ICD-10-CM

## 2015-03-19 DIAGNOSIS — I4891 Unspecified atrial fibrillation: Secondary | ICD-10-CM

## 2015-03-19 LAB — POCT INR: INR: 1.9

## 2015-03-27 ENCOUNTER — Encounter: Payer: Self-pay | Admitting: Gastroenterology

## 2015-04-12 ENCOUNTER — Ambulatory Visit (INDEPENDENT_AMBULATORY_CARE_PROVIDER_SITE_OTHER): Payer: Medicare Other | Admitting: Pharmacist

## 2015-04-12 DIAGNOSIS — I4891 Unspecified atrial fibrillation: Secondary | ICD-10-CM | POA: Diagnosis not present

## 2015-04-12 DIAGNOSIS — I482 Chronic atrial fibrillation, unspecified: Secondary | ICD-10-CM

## 2015-04-12 DIAGNOSIS — Z5181 Encounter for therapeutic drug level monitoring: Secondary | ICD-10-CM | POA: Diagnosis not present

## 2015-04-12 DIAGNOSIS — Z7901 Long term (current) use of anticoagulants: Secondary | ICD-10-CM | POA: Diagnosis not present

## 2015-04-12 LAB — POCT INR: INR: 2.5

## 2015-04-16 NOTE — Progress Notes (Signed)
HPI: FU permanent atrial fibrillation. Last echo in August 2016 showed normal LV function, elevated left ventricular filling pressures, mild aortic and mitral regurgitation, severe left atrial enlargement, moderate right atrial enlargement, mild right ventricular enlargement and moderate tricuspid regurgitation. At previous office visit patient noted increased dyspnea. Holter monitor August 2016 showed A. Fib with rate decreased. Carvedilol discontinued. BNP 388. CBC showed white blood cell count 3.9, hemoglobin 12.6 and platelets 132. Started on lasix. Since last seen, Patient denies dyspnea, exertional chest pain, syncope or bleeding.Chronic pedal edema.  Current Outpatient Prescriptions  Medication Sig Dispense Refill  . amLODipine (NORVASC) 10 MG tablet Take 1 tablet (10 mg total) by mouth daily. 90 tablet 3  . Calcium 600 MG tablet Take 2 tablets by mouth 2 (two) times daily. Every other day    . cholecalciferol (VITAMIN D) 1000 UNITS tablet Take 1,000 Units by mouth daily.      . Ferrous Sulfate (IRON) 325 (65 FE) MG TABS Take 1 tablet by mouth daily.      . fish oil-omega-3 fatty acids 1000 MG capsule Take 2 capsules by mouth 2 (two) times daily.     Marland Kitchen FLUoxetine (PROZAC) 20 MG tablet Take 20 mg by mouth daily.    . furosemide (LASIX) 20 MG tablet Take 1 tablet (20 mg total) by mouth daily. 90 tablet 3  . losartan (COZAAR) 100 MG tablet Take 1 tablet (100 mg total) by mouth daily. 90 tablet 3  . Magnesium 100 MG CAPS Take 1 capsule by mouth daily.    . Multiple Vitamin (MULTIVITAMIN) tablet Take 1 tablet by mouth daily.    Marland Kitchen omeprazole (PRILOSEC) 20 MG capsule Take 20 mg by mouth daily.      Marland Kitchen oxyCODONE-acetaminophen (PERCOCET) 7.5-325 MG per tablet Take 1 tablet by mouth every 4 (four) hours as needed.      . potassium chloride (KLOR-CON M10) 10 MEQ tablet TAKE 1 TABLET BY MOUTH EVERY DAY 30 tablet 3  . pravastatin (PRAVACHOL) 10 MG tablet Take 1 tablet (10 mg total) by mouth  daily. 30 tablet 3  . vitamin E 400 UNIT capsule Take 400 Units by mouth daily.      Marland Kitchen warfarin (COUMADIN) 5 MG tablet Take 1-1.5 tablets by mouth daily as directed by coumadin clinic 40 tablet 3   No current facility-administered medications for this visit.     Past Medical History  Diagnosis Date  . Unspecified essential hypertension   . Anxiety state, unspecified     past at death of spouse  . Depressive disorder, not elsewhere classified     past at death of spouse  . Other and unspecified hyperlipidemia   . Dyskinesia of esophagus   . Long term (current) use of anticoagulants   . Hiatal hernia   . Duodenitis without mention of hemorrhage   . Unspecified gastritis and gastroduodenitis without mention of hemorrhage   . Unspecified hemorrhoids without mention of complication   . Diverticulosis of colon (without mention of hemorrhage)   . Barrett's esophagus   . Irritable bowel syndrome   . Unspecified sleep apnea   . Anemia   . Atrial fibrillation ([place])   . Skin cancer (melanoma) ([place])   . Status post dilation of esophageal narrowing   . GERD (gastroesophageal reflux disease)   . Hereditary and idiopathic peripheral neuropathy 10/23/2014    Past Surgical History  Procedure Laterality Date  . Nissen fundoplication  [id number]  . Turp vaporization  1980s  .  Carpal tunnel release Bilateral 2003  . Appendectomy  1945  . Tonsillectomy    . Vasectomy    . Lumbar laminectomy    . Small bowel obsturction      x4  . Cardiac catheterization  1991  . Central decompressive laminectomy      L2-L5  . Esophagogastroduodenoscopy    . Colonoscopy    . Spinal cord stimulator implant  06/2011    Dr. Anselm Lis  . Eus  11/13/2011    Procedure: LOWER ENDOSCOPIC ULTRASOUND (EUS);  Surgeon: Milus Banister, MD;  Location: [facility];  Service: Endoscopy;  Laterality: N/A;    Social History   Social History  . Marital Status: Widowed    Spouse Name: N/A  . Number of Children: 1  . Years  of Education: N/A   Occupational History  . retired    Social History Main Topics  . Smoking status: Former Smoker    Quit date: 03/31/1972  . Smokeless tobacco: Never Used  . Alcohol Use: 0.6 oz/week    1 Shots of liquor per week     Comment: ocass  . Drug Use: No  . Sexual Activity: Not on file   Other Topics Concern  . Not on file   Social History Narrative   Alcohol use- yes occasional    Family History  Problem Relation Age of Onset  . Stroke Mother   . Coronary artery disease Father   . Colon cancer Neg Hx   . Heart disease Other     [place]    ROS: Leg weakness but no fevers or chills, productive cough, hemoptysis, dysphasia, odynophagia, melena, hematochezia, dysuria, hematuria, rash, seizure activity, orthopnea, PND, claudication. Remaining systems are negative.  Physical Exam: Well-developed well-nourished in no acute distress.  Skin is warm and dry.  HEENT is normal.  Neck is supple.  Chest is clear to auscultation with normal expansion.  Cardiovascular exam is irregular Abdominal exam nontender or distended. No masses palpated. Extremities show 1+ edema. neuro grossly intact  ECG Atrial fibrillation at a rate of 65. Right bundle branch block.

## 2015-04-17 ENCOUNTER — Ambulatory Visit (INDEPENDENT_AMBULATORY_CARE_PROVIDER_SITE_OTHER): Payer: Medicare Other | Admitting: Cardiology

## 2015-04-17 ENCOUNTER — Encounter: Payer: Self-pay | Admitting: Cardiology

## 2015-04-17 VITALS — BP 160/84 | HR 65 | Ht 71.0 in | Wt 186.8 lb

## 2015-04-17 DIAGNOSIS — I5032 Chronic diastolic (congestive) heart failure: Secondary | ICD-10-CM | POA: Diagnosis not present

## 2015-04-17 DIAGNOSIS — I4891 Unspecified atrial fibrillation: Secondary | ICD-10-CM

## 2015-04-17 NOTE — Patient Instructions (Signed)

## 2015-04-17 NOTE — Assessment & Plan Note (Signed)
Blood pressure mildly elevated but he follows this at home and typically control. Continue present medications and follow.

## 2015-04-17 NOTE — Assessment & Plan Note (Signed)
Continue statin. 

## 2015-04-17 NOTE — Assessment & Plan Note (Signed)
Continue Coumadin. Patient is not interested in NOACs. Rate is controlled on the medications.

## 2015-04-17 NOTE — Assessment & Plan Note (Signed)
Continue present dose of Lasix. He can take an additional 20 mg daily as needed. Check potassium and renal function.

## 2015-04-18 LAB — BASIC METABOLIC PANEL
BUN: 23 mg/dL (ref 7–25)
CALCIUM: 9.8 mg/dL (ref 8.6–10.3)
CO2: 27 mmol/L (ref 20–31)
CREATININE: 0.83 mg/dL (ref 0.70–1.11)
Chloride: 103 mmol/L (ref 98–110)
GLUCOSE: 85 mg/dL (ref 65–99)
Potassium: 4.2 mmol/L (ref 3.5–5.3)
Sodium: 141 mmol/L (ref 135–146)

## 2015-04-27 ENCOUNTER — Ambulatory Visit: Payer: Medicare Other | Admitting: Cardiology

## 2015-05-01 ENCOUNTER — Other Ambulatory Visit: Payer: Self-pay | Admitting: *Deleted

## 2015-05-01 MED ORDER — POTASSIUM CHLORIDE CRYS ER 10 MEQ PO TBCR: EXTENDED_RELEASE_TABLET | ORAL | Status: DC

## 2015-05-10 ENCOUNTER — Ambulatory Visit (INDEPENDENT_AMBULATORY_CARE_PROVIDER_SITE_OTHER): Payer: Medicare Other | Admitting: *Deleted

## 2015-05-10 DIAGNOSIS — I482 Chronic atrial fibrillation, unspecified: Secondary | ICD-10-CM

## 2015-05-10 DIAGNOSIS — Z7901 Long term (current) use of anticoagulants: Secondary | ICD-10-CM

## 2015-05-10 DIAGNOSIS — Z5181 Encounter for therapeutic drug level monitoring: Secondary | ICD-10-CM

## 2015-05-10 DIAGNOSIS — I4891 Unspecified atrial fibrillation: Secondary | ICD-10-CM

## 2015-05-10 LAB — POCT INR: INR: 3.1

## 2015-05-25 ENCOUNTER — Telehealth: Payer: Self-pay | Admitting: Internal Medicine

## 2015-05-25 NOTE — Telephone Encounter (Signed)
Patient reports that he is having pain and constipation. He reports he is not able to have a BM.  He tried a Fleet enema last night with only flatus as a result.  He is advised to try Miralax 1-3 times a day until office visit 05/29/15 3:45 with Dr. Carlean Purl.  He will call back for worsening symptoms.

## 2015-05-29 ENCOUNTER — Encounter: Payer: Self-pay | Admitting: Internal Medicine

## 2015-05-29 ENCOUNTER — Ambulatory Visit (INDEPENDENT_AMBULATORY_CARE_PROVIDER_SITE_OTHER): Payer: Medicare Other | Admitting: Internal Medicine

## 2015-05-29 VITALS — BP 140/80 | HR 58 | Ht 71.0 in | Wt 184.0 lb

## 2015-05-29 DIAGNOSIS — K2270 Barrett's esophagus without dysplasia: Secondary | ICD-10-CM | POA: Diagnosis not present

## 2015-05-29 DIAGNOSIS — K5900 Constipation, unspecified: Secondary | ICD-10-CM

## 2015-05-29 DIAGNOSIS — K5909 Other constipation: Secondary | ICD-10-CM

## 2015-05-29 NOTE — Progress Notes (Signed)
   Subjective:    Patient ID: Ian Sullivan, male    DOB: 1927/07/12, 82 y.o.   MRN: [id number] Cc: constipation HPI  Pleasant elderly wm w/ hx chronic constipation. Things worsened recently and was advised by my RN to try MiraLax which he did and on 1-2 doses a day is having satisfactory defecation. Had flex sig 2013 - rectal lipoma (EUS dx - and 2008 CT colonoscopy)  Asking about stopping PPI - wants to see if he can get off it - not concerned about potential side effects Medications, allergies, past medical history, past surgical history, family history and social history are reviewed and updated in the EMR.  Review of Systems Back pain, urinary incontinence since spine surgery, LE weakness    Objective:   Physical Exam BP 140/80 mmHg  Pulse 58  Ht 5\' 11"  (1.803 m)  Wt 184 lb (83.462 kg)  BMI 25.67 kg/m2 Looks younger Rectal - no mass, NL tone, no stool     Assessment & Plan:  Chronic constipation  Barrett's esophagus without dysplasia  He will stay on MiraLAx He is advised to see if he can taper the PPI - recent guidelines tell us PPI optional in Barrett's - at his age if he can stop the PPi seems reasonable

## 2015-05-29 NOTE — Patient Instructions (Addendum)
  Take miralax daily, coupon provided.   Try and taper your prilosec if you like.  Take it every other day, then go to one every third day over a few weeks or so.  If symptoms return then retry it.    I appreciate the opportunity to care for you. Silvano Rusk, MD, [facility]

## 2015-06-01 ENCOUNTER — Ambulatory Visit (INDEPENDENT_AMBULATORY_CARE_PROVIDER_SITE_OTHER): Payer: Medicare Other | Admitting: *Deleted

## 2015-06-01 DIAGNOSIS — Z7901 Long term (current) use of anticoagulants: Secondary | ICD-10-CM

## 2015-06-01 DIAGNOSIS — I482 Chronic atrial fibrillation, unspecified: Secondary | ICD-10-CM

## 2015-06-01 DIAGNOSIS — I4891 Unspecified atrial fibrillation: Secondary | ICD-10-CM

## 2015-06-01 DIAGNOSIS — Z5181 Encounter for therapeutic drug level monitoring: Secondary | ICD-10-CM | POA: Diagnosis not present

## 2015-06-01 LAB — POCT INR: INR: 2.8

## 2015-06-25 ENCOUNTER — Encounter: Payer: Self-pay | Admitting: Neurology

## 2015-06-25 ENCOUNTER — Ambulatory Visit (INDEPENDENT_AMBULATORY_CARE_PROVIDER_SITE_OTHER): Payer: Self-pay | Admitting: Neurology

## 2015-06-25 VITALS — Ht 71.0 in

## 2015-06-25 DIAGNOSIS — M5416 Radiculopathy, lumbar region: Secondary | ICD-10-CM

## 2015-06-26 ENCOUNTER — Encounter: Payer: Self-pay | Admitting: Neurology

## 2015-06-26 NOTE — Progress Notes (Signed)
The patient came too late for the study to be done today, will need to be rescheduled.

## 2015-06-27 ENCOUNTER — Other Ambulatory Visit: Payer: Self-pay | Admitting: Cardiology

## 2015-07-16 ENCOUNTER — Ambulatory Visit (INDEPENDENT_AMBULATORY_CARE_PROVIDER_SITE_OTHER): Payer: Medicare Other | Admitting: Pharmacist

## 2015-07-16 DIAGNOSIS — I4891 Unspecified atrial fibrillation: Secondary | ICD-10-CM | POA: Diagnosis not present

## 2015-07-16 DIAGNOSIS — Z5181 Encounter for therapeutic drug level monitoring: Secondary | ICD-10-CM

## 2015-07-16 DIAGNOSIS — I482 Chronic atrial fibrillation, unspecified: Secondary | ICD-10-CM

## 2015-07-16 DIAGNOSIS — Z7901 Long term (current) use of anticoagulants: Secondary | ICD-10-CM

## 2015-07-16 LAB — POCT INR: INR: 2.7

## 2015-07-17 ENCOUNTER — Encounter: Payer: Self-pay | Admitting: Neurology

## 2015-07-17 ENCOUNTER — Ambulatory Visit (INDEPENDENT_AMBULATORY_CARE_PROVIDER_SITE_OTHER): Payer: Medicare Other | Admitting: Neurology

## 2015-07-17 ENCOUNTER — Ambulatory Visit (INDEPENDENT_AMBULATORY_CARE_PROVIDER_SITE_OTHER): Payer: Self-pay | Admitting: Neurology

## 2015-07-17 DIAGNOSIS — M5416 Radiculopathy, lumbar region: Secondary | ICD-10-CM

## 2015-07-17 DIAGNOSIS — G609 Hereditary and idiopathic neuropathy, unspecified: Secondary | ICD-10-CM

## 2015-07-17 NOTE — Progress Notes (Signed)
Please refer to EMG and nerve conduction study procedure note. 

## 2015-07-17 NOTE — Procedures (Signed)
HISTORY:  Ian Sullivan is an 82 year old gentleman with a history of prior lumbosacral spine surgery who has had a gradual worsening of his ability to ambulate, with a sensation of imbalance. He reports numbness in the feet, he has had 2 prior EMG studies suggesting bilateral S1 radiculopathies, and a peripheral neuropathy. He is sent back for a follow-up study.  NERVE CONDUCTION STUDIES:  Nerve conduction studies were performed on the left upper extremity. The distal motor latency for the left median nerve was prolonged with a normal motor amplitude. The distal motor latency and motor amplitude for the ulnar nerve was normal. The F wave latencies and nerve conduction velocities for the left median and ulnar nerves were normal. The sensory latencies for the left median nerve was prolonged, normal for the left ulnar nerve.  Nerve conduction studies were performed on both lower extremities. The peroneal nerves revealed no response on the left, with a normal distal motor latency on the right with a low motor amplitude. The nerve conduction velocities above and below the knee on the right peroneal nerve were normal. Studies of the posterior tibial nerves bilaterally were unresponsive. The H reflex latencies were unobtainable bilaterally, and the peroneal sensory latencies were unobtainable bilaterally.  EMG STUDIES:  EMG study was performed on the right lower extremity:  The tibialis anterior muscle reveals 2 to 4K motor units with decreased recruitment. No fibrillations or positive waves were seen. The peroneus tertius muscle reveals 2 to 4K motor units with decreased recruitment. No fibrillations or positive waves were seen. The medial gastrocnemius muscle reveals 1 to 3K motor units with decreased recruitment. 2+ fibrillations and positive waves were seen. The vastus lateralis muscle reveals 2 to 3K motor units with slightly decreased recruitment. No fibrillations or positive waves were  seen. The iliopsoas muscle reveals 2 to 4K motor units with full recruitment. No fibrillations or positive waves were seen. The biceps femoris muscle (long head) reveals 2 to 4K motor units with full recruitment. No fibrillations or positive waves were seen. The lumbosacral paraspinal muscles were tested at 3 levels, and revealed no abnormalities of insertional activity at the upper level, with 1+ positive waves that the middle level, 2+ at the lower level. There was good relaxation.  EMG study was performed on the left lower extremity:  The tibialis anterior muscle reveals 2 to 4K motor units with decreased recruitment. No fibrillations or positive waves were seen. The peroneus tertius muscle reveals 2 to 4K motor units with decreased recruitment. No fibrillations or positive waves were seen. The medial gastrocnemius muscle reveals up to 6K motor units with decreased recruitment. 2+ fibrillations and positive waves were seen. The vastus lateralis muscle reveals 2 to 3 K motor units with decreased recruitment. No fibrillations or positive waves were seen. The iliopsoas muscle reveals 2 to 4K motor units with full recruitment. No fibrillations or positive waves were seen. The biceps femoris muscle (long head) reveals 2 to 4K motor units with full recruitment. No fibrillations or positive waves were seen. The lumbosacral paraspinal muscles were tested at 3 levels, and revealed no abnormalities of insertional activity at the upper and middle levels tested. 2+ fibrillations and positive waves were seen at the lower level. There was good relaxation.   IMPRESSION:  Nerve conduction studies done on the left upper extremity and both lower extremities shows evidence of a severe primarily axonal peripheral neuropathy with an overlying mild left carpal tunnel syndrome. In comparison to a prior study done  on 10/23/2014, there has been slight progression of neuropathy on this evaluation. EMG evaluation of both  lower extremities shows mild distal chronic denervation consistent with a peripheral neuropathy with findings that are less severe than what the nerve conduction studies would suggest. There appears to be evidence of bilateral chronic and acute findings consistent with an S1 radiculopathy. EMG findings are similar to what was seen previously in July 2016.  Jill Alexanders MD 07/17/2015 1:05 PM  [facility] [address] [place], [place] [address]  Phone [phone] Fax [phone]

## 2015-08-06 ENCOUNTER — Telehealth: Payer: Self-pay | Admitting: Cardiology

## 2015-08-06 NOTE — Telephone Encounter (Signed)
Spoke with pt, he still has some swelling in the morning when he gets up but by the end of the day it is worse. He denies SOB. Pt instructed to take the furosemide 2 tablets once daily for 3 days and then decrease back to one tablet daily. He will call back if this does not help. Instructions regarding low salt/sodium diet, fluid restrictions and encouraged pt to use his compression hose during the day. Pt agreed with this plan.

## 2015-08-06 NOTE — Telephone Encounter (Signed)
Pt says his legs and feet are still swelling,pt says he is taking Furosemide..Please call,pt says he might need to come in.

## 2015-08-20 ENCOUNTER — Ambulatory Visit (INDEPENDENT_AMBULATORY_CARE_PROVIDER_SITE_OTHER): Payer: Medicare Other | Admitting: *Deleted

## 2015-08-20 DIAGNOSIS — I4891 Unspecified atrial fibrillation: Secondary | ICD-10-CM | POA: Diagnosis not present

## 2015-08-20 DIAGNOSIS — Z5181 Encounter for therapeutic drug level monitoring: Secondary | ICD-10-CM | POA: Diagnosis not present

## 2015-08-20 DIAGNOSIS — I482 Chronic atrial fibrillation, unspecified: Secondary | ICD-10-CM

## 2015-08-20 DIAGNOSIS — Z7901 Long term (current) use of anticoagulants: Secondary | ICD-10-CM | POA: Diagnosis not present

## 2015-08-20 LAB — POCT INR: INR: 2.6

## 2015-09-03 ENCOUNTER — Telehealth: Payer: Self-pay | Admitting: Internal Medicine

## 2015-09-03 ENCOUNTER — Inpatient Hospital Stay (HOSPITAL_COMMUNITY)
Admission: EM | Admit: 2015-09-03 | Discharge: 2015-09-13 | DRG: 330 | Disposition: A | Discharge status: Home / Self Care | Payer: Medicare Other | Attending: Family Medicine | Admitting: Family Medicine

## 2015-09-03 ENCOUNTER — Emergency Department (HOSPITAL_COMMUNITY): Payer: Medicare Other

## 2015-09-03 ENCOUNTER — Encounter (HOSPITAL_COMMUNITY): Payer: Self-pay | Admitting: *Deleted

## 2015-09-03 DIAGNOSIS — Z4659 Encounter for fitting and adjustment of other gastrointestinal appliance and device: Secondary | ICD-10-CM

## 2015-09-03 DIAGNOSIS — R933 Abnormal findings on diagnostic imaging of other parts of digestive tract: Secondary | ICD-10-CM | POA: Diagnosis not present

## 2015-09-03 DIAGNOSIS — K219 Gastro-esophageal reflux disease without esophagitis: Secondary | ICD-10-CM | POA: Diagnosis present

## 2015-09-03 DIAGNOSIS — K589 Irritable bowel syndrome without diarrhea: Secondary | ICD-10-CM | POA: Diagnosis present

## 2015-09-03 DIAGNOSIS — I110 Hypertensive heart disease with heart failure: Secondary | ICD-10-CM | POA: Diagnosis present

## 2015-09-03 DIAGNOSIS — Z8249 Family history of ischemic heart disease and other diseases of the circulatory system: Secondary | ICD-10-CM

## 2015-09-03 DIAGNOSIS — R1084 Generalized abdominal pain: Secondary | ICD-10-CM | POA: Diagnosis not present

## 2015-09-03 DIAGNOSIS — E876 Hypokalemia: Secondary | ICD-10-CM | POA: Insufficient documentation

## 2015-09-03 DIAGNOSIS — I5032 Chronic diastolic (congestive) heart failure: Secondary | ICD-10-CM | POA: Diagnosis present

## 2015-09-03 DIAGNOSIS — K562 Volvulus: Principal | ICD-10-CM | POA: Diagnosis present

## 2015-09-03 DIAGNOSIS — F329 Major depressive disorder, single episode, unspecified: Secondary | ICD-10-CM | POA: Diagnosis present

## 2015-09-03 DIAGNOSIS — I482 Chronic atrial fibrillation: Secondary | ICD-10-CM | POA: Diagnosis present

## 2015-09-03 DIAGNOSIS — Z9049 Acquired absence of other specified parts of digestive tract: Secondary | ICD-10-CM

## 2015-09-03 DIAGNOSIS — G609 Hereditary and idiopathic neuropathy, unspecified: Secondary | ICD-10-CM | POA: Diagnosis present

## 2015-09-03 DIAGNOSIS — G5790 Unspecified mononeuropathy of unspecified lower limb: Secondary | ICD-10-CM | POA: Diagnosis present

## 2015-09-03 DIAGNOSIS — M545 Low back pain: Secondary | ICD-10-CM | POA: Diagnosis present

## 2015-09-03 DIAGNOSIS — I452 Bifascicular block: Secondary | ICD-10-CM | POA: Diagnosis present

## 2015-09-03 DIAGNOSIS — G4730 Sleep apnea, unspecified: Secondary | ICD-10-CM | POA: Diagnosis present

## 2015-09-03 DIAGNOSIS — R791 Abnormal coagulation profile: Secondary | ICD-10-CM | POA: Diagnosis present

## 2015-09-03 DIAGNOSIS — I080 Rheumatic disorders of both mitral and aortic valves: Secondary | ICD-10-CM | POA: Diagnosis present

## 2015-09-03 DIAGNOSIS — I4891 Unspecified atrial fibrillation: Secondary | ICD-10-CM | POA: Diagnosis present

## 2015-09-03 DIAGNOSIS — M810 Age-related osteoporosis without current pathological fracture: Secondary | ICD-10-CM | POA: Diagnosis present

## 2015-09-03 DIAGNOSIS — I4510 Unspecified right bundle-branch block: Secondary | ICD-10-CM | POA: Diagnosis present

## 2015-09-03 DIAGNOSIS — K2270 Barrett's esophagus without dysplasia: Secondary | ICD-10-CM | POA: Diagnosis present

## 2015-09-03 DIAGNOSIS — Z79899 Other long term (current) drug therapy: Secondary | ICD-10-CM

## 2015-09-03 DIAGNOSIS — Z823 Family history of stroke: Secondary | ICD-10-CM

## 2015-09-03 DIAGNOSIS — F411 Generalized anxiety disorder: Secondary | ICD-10-CM | POA: Diagnosis present

## 2015-09-03 DIAGNOSIS — Z87891 Personal history of nicotine dependence: Secondary | ICD-10-CM

## 2015-09-03 DIAGNOSIS — D179 Benign lipomatous neoplasm, unspecified: Secondary | ICD-10-CM | POA: Diagnosis present

## 2015-09-03 DIAGNOSIS — K5909 Other constipation: Secondary | ICD-10-CM | POA: Insufficient documentation

## 2015-09-03 DIAGNOSIS — Z7901 Long term (current) use of anticoagulants: Secondary | ICD-10-CM

## 2015-09-03 DIAGNOSIS — E785 Hyperlipidemia, unspecified: Secondary | ICD-10-CM | POA: Diagnosis present

## 2015-09-03 DIAGNOSIS — I10 Essential (primary) hypertension: Secondary | ICD-10-CM | POA: Diagnosis present

## 2015-09-03 DIAGNOSIS — G8929 Other chronic pain: Secondary | ICD-10-CM | POA: Diagnosis present

## 2015-09-03 DIAGNOSIS — Z85820 Personal history of malignant melanoma of skin: Secondary | ICD-10-CM

## 2015-09-03 LAB — CBC WITH DIFFERENTIAL/PLATELET
BASOS ABS: 0 10*3/uL (ref 0.0–0.1)
BASOS PCT: 0 %
EOS ABS: 0 10*3/uL (ref 0.0–0.7)
EOS PCT: 0 %
HCT: 42.6 % (ref 39.0–52.0)
Hemoglobin: 15 g/dL (ref 13.0–17.0)
Lymphocytes Relative: 13 %
Lymphs Abs: 1.1 10*3/uL (ref 0.7–4.0)
MCH: 32.5 pg (ref 26.0–34.0)
MCHC: 35.2 g/dL (ref 30.0–36.0)
MCV: 92.4 fL (ref 78.0–100.0)
MONO ABS: 0.9 10*3/uL (ref 0.1–1.0)
Monocytes Relative: 11 %
Neutro Abs: 6.8 10*3/uL (ref 1.7–7.7)
Neutrophils Relative %: 76 %
PLATELETS: 203 10*3/uL (ref 150–400)
RBC: 4.61 MIL/uL (ref 4.22–5.81)
RDW: 13.9 % (ref 11.5–15.5)
WBC: 8.9 10*3/uL (ref 4.0–10.5)

## 2015-09-03 LAB — I-STAT CG4 LACTIC ACID, ED
LACTIC ACID, VENOUS: 2 mmol/L (ref 0.5–2.0)
LACTIC ACID, VENOUS: 1.97 mmol/L (ref 0.5–2.0)

## 2015-09-03 LAB — COMPREHENSIVE METABOLIC PANEL
ALT: 27 U/L (ref 17–63)
AST: 48 U/L — AB (ref 15–41)
Albumin: 4.9 g/dL (ref 3.5–5.0)
Alkaline Phosphatase: 111 U/L (ref 38–126)
Anion gap: 14 (ref 5–15)
BUN: 23 mg/dL — ABNORMAL HIGH (ref 6–20)
CHLORIDE: 102 mmol/L (ref 101–111)
CO2: 22 mmol/L (ref 22–32)
Calcium: 9.4 mg/dL (ref 8.9–10.3)
Creatinine, Ser: 0.99 mg/dL (ref 0.61–1.24)
Glucose, Bld: 107 mg/dL — ABNORMAL HIGH (ref 65–99)
POTASSIUM: 3.3 mmol/L — AB (ref 3.5–5.1)
Sodium: 138 mmol/L (ref 135–145)
Total Bilirubin: 2.2 mg/dL — ABNORMAL HIGH (ref 0.3–1.2)
Total Protein: 8.2 g/dL — ABNORMAL HIGH (ref 6.5–8.1)

## 2015-09-03 LAB — PROTIME-INR
INR: 3.16 — AB (ref 0.00–1.49)
Prothrombin Time: 30.9 seconds — ABNORMAL HIGH (ref 11.6–15.2)

## 2015-09-03 MED ORDER — SODIUM CHLORIDE 0.9 % IV BOLUS (SEPSIS)
500.0000 mL | Freq: Once | INTRAVENOUS | Status: AC
  Administered 2015-09-03: 500 mL via INTRAVENOUS

## 2015-09-03 MED ORDER — SODIUM CHLORIDE 0.9 % IV SOLN
INTRAVENOUS | Status: DC
  Administered 2015-09-03: 22:00:00 via INTRAVENOUS

## 2015-09-03 MED ORDER — SODIUM CHLORIDE 0.9 % IV BOLUS (SEPSIS)
1000.0000 mL | Freq: Once | INTRAVENOUS | Status: AC
  Administered 2015-09-03: 1000 mL via INTRAVENOUS

## 2015-09-03 MED ORDER — LIDOCAINE VISCOUS 2 % MT SOLN
15.0000 mL | Freq: Once | OROMUCOSAL | Status: AC
  Administered 2015-09-03: 15 mL via OROMUCOSAL
  Filled 2015-09-03: qty 15

## 2015-09-03 MED ORDER — FENTANYL CITRATE (PF) 100 MCG/2ML IJ SOLN
INTRAMUSCULAR | Status: AC
  Filled 2015-09-03: qty 2

## 2015-09-03 MED ORDER — IOPAMIDOL (ISOVUE-370) INJECTION 76%
100.0000 mL | Freq: Once | INTRAVENOUS | Status: AC | PRN
  Administered 2015-09-03: 100 mL via INTRAVENOUS

## 2015-09-03 MED ORDER — MIDAZOLAM HCL 5 MG/ML IJ SOLN
INTRAMUSCULAR | Status: AC
  Filled 2015-09-03: qty 2

## 2015-09-03 NOTE — ED Notes (Signed)
Pt c/o abdominal pain, constipation, distention. Last BM 2 weeks ago. Pt has Hx of Nissan Fundoplication 35 years ago has long history of same, states he always requires NG tube.

## 2015-09-03 NOTE — Telephone Encounter (Signed)
Ok

## 2015-09-03 NOTE — Telephone Encounter (Signed)
Pt states that his abdomen is very swollen like he is 9 mth pregnant. States this has happened to him in the past and he has to go to the ER and have his stomach pumped. States this has happened to him in the past and he wanted to let Dr. Carlean Purl know he will be going to the ER. Dr. Carlean Purl notified.

## 2015-09-03 NOTE — Consult Note (Signed)
HPI :  82 y/o male consulted by the ER staff Dr. Vallery Ridge, known to Dr. Carlean Purl with history of A fibb on Coumadin presenting with abdominal pain / distension, and lack of bowel movements for one week, with progressive worsening. He reports intermittent episodes of abdominal distension over the years which have come and gone sporadically. He reports over the past week his abdomen has been distended with lower abdominal pain, not passing much gas or any stool at all. Seen in the ER this evening, CT scan shows sigmoid volvulus with transition at the rectosigmoid colon. No significant leukocytosis, normal lactate, labs stable. Patient has not been able to tolerate any meds for 3 days. INR at 3.1. No fevers. He denies having known history of volvulus before. HE has a known lipoma of the rectum noted on EUS in 2013. Prior colonoscopy in 2008 notable for diverticulosis. Surgery was consulted initially who recommended endoscopic management at present time.   Past Medical History  Diagnosis Date  . Unspecified essential hypertension   . Anxiety state, unspecified     past at death of spouse  . Depressive disorder, not elsewhere classified     past at death of spouse  . Other and unspecified hyperlipidemia   . Dyskinesia of esophagus   . Long term (current) use of anticoagulants   . Hiatal hernia   . Duodenitis without mention of hemorrhage   . Unspecified gastritis and gastroduodenitis without mention of hemorrhage   . Unspecified hemorrhoids without mention of complication   . Diverticulosis of colon (without mention of hemorrhage)   . Barrett's esophagus   . Irritable bowel syndrome   . Unspecified sleep apnea   . Anemia   . Atrial fibrillation ([place])   . Skin cancer (melanoma) ([place])   . Status post dilation of esophageal narrowing   . GERD (gastroesophageal reflux disease)   . Hereditary and idiopathic peripheral neuropathy 10/23/2014     Past Surgical History  Procedure Laterality Date    . Nissen fundoplication  [id number]  . Turp vaporization  1980s  . Carpal tunnel release Bilateral 2003  . Appendectomy  1945  . Tonsillectomy    . Vasectomy    . Lumbar laminectomy    . Small bowel obsturction      x4  . Cardiac catheterization  1991  . Central decompressive laminectomy      L2-L5  . Esophagogastroduodenoscopy    . Colonoscopy    . Spinal cord stimulator implant  06/2011    Dr. Anselm Lis  . Eus  11/13/2011    Procedure: LOWER ENDOSCOPIC ULTRASOUND (EUS);  Surgeon: Milus Banister, MD;  Location: [facility];  Service: Endoscopy;  Laterality: N/A;   Family History  Problem Relation Age of Onset  . Stroke Mother   . Coronary artery disease Father   . Colon cancer Neg Hx   . Heart disease Other     [facility]   Social History  Substance Use Topics  . Smoking status: Former Smoker    Quit date: 03/31/1972  . Smokeless tobacco: Never Used  . Alcohol Use: 0.6 oz/week    1 Shots of liquor per week     Comment: ocass   Current Facility-Administered Medications  Medication Dose Route Frequency Provider Last Rate Last Dose  . 0.9 %  sodium chloride infusion   Intravenous Continuous Charlesetta Shanks, MD 125 mL/hr at 09/03/15 2135     Allergies  Allergen Reactions  . Horse-Derived Products  Review of Systems: All systems reviewed and negative except where noted in HPI.    Dg Abd 1 View  09/03/2015  CLINICAL DATA:  Evaluate NG tube placement EXAM: ABDOMEN - 1 VIEW COMPARISON:  KUB from earlier today FINDINGS: The distal tip of the NG tube is just below the GE junction with side port above the GE junction. Recommend advancement. Diffuse dilated loops of large and small bowel again identified. IMPRESSION: The side port of the NG tube is above the GE junction. The patient may benefit from advancing the NG tube several cm. Continued bowel distention. Electronically Signed   By: Dorise Bullion III M.D   On: 09/03/2015 16:59   Ct Abdomen Pelvis W Contrast  09/03/2015  CLINICAL  DATA:  Generalized abdominal pain EXAM: CT ABDOMEN AND PELVIS WITH CONTRAST TECHNIQUE: Multidetector CT imaging of the abdomen and pelvis was performed using the standard protocol following bolus administration of intravenous contrast. CONTRAST:  100 mL Isovue-370 COMPARISON:  Plain film from earlier in the same day FINDINGS: Lung bases demonstrate some minimal right basilar atelectatic changes. No focal infiltrate or sizable effusion is noted. The liver, gallbladder, spleen, adrenal glands and pancreas are within normal limits. Kidneys are well visualized bilaterally with a normal enhancement pattern. Delayed images demonstrate normal excretion of contrast and small para pelvic cysts bilaterally. A nasogastric catheter is noted coiled within the stomach. There is significant distension of the colon identified with an abrupt caliber change in the rectosigmoid with swirling vasculature surrounding the area of narrowing consistent with sigmoid volvulus. Significant redundancy of the sigmoid is noted. Small bowel as visualized is within normal limits. No pneumatosis or free air is identified at this time. The appendix is not visualize consistent with a prior surgical history. The bladder is well distended. No pelvic mass lesion or sidewall abnormality is noted. No acute bony abnormality is noted. Postsurgical changes in the lumbar spine are seen IMPRESSION: Changes consistent with sigmoid volvulus with significant colonic distention. No free air or pneumatosis is noted at this time. Mild bibasilar atelectasis. No other acute abnormality is noted. Critical Value/emergent results were called by telephone at the time of interpretation on 09/03/2015 at 7:13 pm to Dr. Johnney Killian , who verbally acknowledged these results. Electronically Signed   By: Inez Catalina M.D.   On: 09/03/2015 19:15   Dg Abd Acute W/chest  09/03/2015  CLINICAL DATA:  Constipation and increasing abdominal distention. EXAM: DG ABDOMEN ACUTE W/ 1V CHEST  COMPARISON:  Chest x-ray 09/13/2014. FINDINGS: The heart is mildly enlarged but stable. Stable tortuosity and calcification of the thoracic aorta. Bibasilar scarring changes and eventration of the right hemidiaphragm. Stable scarring changes at the left lung base. The bony thorax is intact. They spinal cord stimulator is noted. Dilated small bowel and colon consistent with a diffuse ileus. No findings for small bowel obstruction or free air. Lower lumbar fusion hardware noted. IMPRESSION: Diffusely distended, gas-filled small bowel and colon consistent with a diffuse ileus. No definite free air. No acute cardiopulmonary findings. Electronically Signed   By: Marijo Sanes M.D.   On: 09/03/2015 15:03    Lab Results  Component Value Date   ALT 27 09/03/2015   AST 48* 09/03/2015   ALKPHOS 111 09/03/2015   BILITOT 2.2* 09/03/2015    Lab Results  Component Value Date   WBC 8.9 09/03/2015   HGB 15.0 09/03/2015   HCT 42.6 09/03/2015   MCV 92.4 09/03/2015   PLT 203 09/03/2015    Lab Results  Component Value Date   CREATININE 0.99 09/03/2015   BUN 23* 09/03/2015   NA 138 09/03/2015   K 3.3* 09/03/2015   CL 102 09/03/2015   CO2 22 09/03/2015    Lab Results  Component Value Date   INR 3.16* 09/03/2015   INR 2.6 08/20/2015   INR 2.7 07/16/2015   PROTIME 19.2 08/31/2008     Physical Exam: BP 183/79 mmHg  Pulse 75  Temp(Src) 98.1 F (36.7 C) (Oral)  Resp 21  SpO2 95% Constitutional: Pleasant, male with NG in place HEENT: Normocephalic and atraumatic. Conjunctivae are normal. No scleral icterus. Neck supple.  Cardiovascular: Normal rate, irregularly irregular Pulmonary/chest: Effort normal and breath sounds normal. No wheezing, rales or rhonchi. Abdominal: significantly distended, mild TTP without rebound or gaurding.  Extremities: trace edema B Neurological: Alert and oriented to person place and time. Skin: Skin is warm and dry. No rashes noted. Psychiatric: Normal mood and  affect. Behavior is normal.   ASSESSMENT AND PLAN: 82 y/o male with progressive distension and obstipation over the past several days, presented with worsening symptoms this evening. CT scan shows sigmoid volvulus. He is not acidotic and labs stable, but is at risk for vascular compromise / gangrene without intervention. We discussed options for management including surgery and endoscopy. Surgery was consulted initially who recommended endoscopic management which is reasonable at this time. I discussed the risks of sedation with him. I discussed risks / benefits of endoscopy with him to include bleeding and perforation. I quoted him a success rate of 75-95% for decompression with endoscopic therapy. We will consider placing rectal tube at time of decompression. He is typed / screened for FFP if needed. He should be admitted for observation following this procedure with surgical consult tomorrow, given high risk of recurrence of volvulus without definitive therapy.  [place] Cellar, MD [facility] Pager [phone]

## 2015-09-03 NOTE — H&P (View-Only) (Signed)
HPI :  82 y/o male consulted by the ER staff Dr. Vallery Ridge, known to Dr. Carlean Purl with history of A fibb on Coumadin presenting with abdominal pain / distension, and lack of bowel movements for one week, with progressive worsening. He reports intermittent episodes of abdominal distension over the years which have come and gone sporadically. He reports over the past week his abdomen has been distended with lower abdominal pain, not passing much gas or any stool at all. Seen in the ER this evening, CT scan shows sigmoid volvulus with transition at the rectosigmoid colon. No significant leukocytosis, normal lactate, labs stable. Patient has not been able to tolerate any meds for 3 days. INR at 3.1. No fevers. He denies having known history of volvulus before. HE has a known lipoma of the rectum noted on EUS in 2013. Prior colonoscopy in 2008 notable for diverticulosis. Surgery was consulted initially who recommended endoscopic management at present time.   Past Medical History  Diagnosis Date  . Unspecified essential hypertension   . Anxiety state, unspecified     past at death of spouse  . Depressive disorder, not elsewhere classified     past at death of spouse  . Other and unspecified hyperlipidemia   . Dyskinesia of esophagus   . Long term (current) use of anticoagulants   . Hiatal hernia   . Duodenitis without mention of hemorrhage   . Unspecified gastritis and gastroduodenitis without mention of hemorrhage   . Unspecified hemorrhoids without mention of complication   . Diverticulosis of colon (without mention of hemorrhage)   . Barrett's esophagus   . Irritable bowel syndrome   . Unspecified sleep apnea   . Anemia   . Atrial fibrillation ([place])   . Skin cancer (melanoma) ([place])   . Status post dilation of esophageal narrowing   . GERD (gastroesophageal reflux disease)   . Hereditary and idiopathic peripheral neuropathy 10/23/2014     Past Surgical History  Procedure Laterality Date    . Nissen fundoplication  [id number]  . Turp vaporization  1980s  . Carpal tunnel release Bilateral 2003  . Appendectomy  1945  . Tonsillectomy    . Vasectomy    . Lumbar laminectomy    . Small bowel obsturction      x4  . Cardiac catheterization  1991  . Central decompressive laminectomy      L2-L5  . Esophagogastroduodenoscopy    . Colonoscopy    . Spinal cord stimulator implant  06/2011    Dr. Anselm Lis  . Eus  11/13/2011    Procedure: LOWER ENDOSCOPIC ULTRASOUND (EUS);  Surgeon: Milus Banister, MD;  Location: [facility];  Service: Endoscopy;  Laterality: N/A;   Family History  Problem Relation Age of Onset  . Stroke Mother   . Coronary artery disease Father   . Colon cancer Neg Hx   . Heart disease Other     [facility]   Social History  Substance Use Topics  . Smoking status: Former Smoker    Quit date: 03/31/1972  . Smokeless tobacco: Never Used  . Alcohol Use: 0.6 oz/week    1 Shots of liquor per week     Comment: ocass   Current Facility-Administered Medications  Medication Dose Route Frequency Provider Last Rate Last Dose  . 0.9 %  sodium chloride infusion   Intravenous Continuous Charlesetta Shanks, MD 125 mL/hr at 09/03/15 2135     Allergies  Allergen Reactions  . Horse-Derived Products  Review of Systems: All systems reviewed and negative except where noted in HPI.    Dg Abd 1 View  09/03/2015  CLINICAL DATA:  Evaluate NG tube placement EXAM: ABDOMEN - 1 VIEW COMPARISON:  KUB from earlier today FINDINGS: The distal tip of the NG tube is just below the GE junction with side port above the GE junction. Recommend advancement. Diffuse dilated loops of large and small bowel again identified. IMPRESSION: The side port of the NG tube is above the GE junction. The patient may benefit from advancing the NG tube several cm. Continued bowel distention. Electronically Signed   By: Dorise Bullion III M.D   On: 09/03/2015 16:59   Ct Abdomen Pelvis W Contrast  09/03/2015  CLINICAL  DATA:  Generalized abdominal pain EXAM: CT ABDOMEN AND PELVIS WITH CONTRAST TECHNIQUE: Multidetector CT imaging of the abdomen and pelvis was performed using the standard protocol following bolus administration of intravenous contrast. CONTRAST:  100 mL Isovue-370 COMPARISON:  Plain film from earlier in the same day FINDINGS: Lung bases demonstrate some minimal right basilar atelectatic changes. No focal infiltrate or sizable effusion is noted. The liver, gallbladder, spleen, adrenal glands and pancreas are within normal limits. Kidneys are well visualized bilaterally with a normal enhancement pattern. Delayed images demonstrate normal excretion of contrast and small para pelvic cysts bilaterally. A nasogastric catheter is noted coiled within the stomach. There is significant distension of the colon identified with an abrupt caliber change in the rectosigmoid with swirling vasculature surrounding the area of narrowing consistent with sigmoid volvulus. Significant redundancy of the sigmoid is noted. Small bowel as visualized is within normal limits. No pneumatosis or free air is identified at this time. The appendix is not visualize consistent with a prior surgical history. The bladder is well distended. No pelvic mass lesion or sidewall abnormality is noted. No acute bony abnormality is noted. Postsurgical changes in the lumbar spine are seen IMPRESSION: Changes consistent with sigmoid volvulus with significant colonic distention. No free air or pneumatosis is noted at this time. Mild bibasilar atelectasis. No other acute abnormality is noted. Critical Value/emergent results were called by telephone at the time of interpretation on 09/03/2015 at 7:13 pm to Dr. Johnney Killian , who verbally acknowledged these results. Electronically Signed   By: Inez Catalina M.D.   On: 09/03/2015 19:15   Dg Abd Acute W/chest  09/03/2015  CLINICAL DATA:  Constipation and increasing abdominal distention. EXAM: DG ABDOMEN ACUTE W/ 1V CHEST  COMPARISON:  Chest x-ray 09/13/2014. FINDINGS: The heart is mildly enlarged but stable. Stable tortuosity and calcification of the thoracic aorta. Bibasilar scarring changes and eventration of the right hemidiaphragm. Stable scarring changes at the left lung base. The bony thorax is intact. They spinal cord stimulator is noted. Dilated small bowel and colon consistent with a diffuse ileus. No findings for small bowel obstruction or free air. Lower lumbar fusion hardware noted. IMPRESSION: Diffusely distended, gas-filled small bowel and colon consistent with a diffuse ileus. No definite free air. No acute cardiopulmonary findings. Electronically Signed   By: Marijo Sanes M.D.   On: 09/03/2015 15:03    Lab Results  Component Value Date   ALT 27 09/03/2015   AST 48* 09/03/2015   ALKPHOS 111 09/03/2015   BILITOT 2.2* 09/03/2015    Lab Results  Component Value Date   WBC 8.9 09/03/2015   HGB 15.0 09/03/2015   HCT 42.6 09/03/2015   MCV 92.4 09/03/2015   PLT 203 09/03/2015    Lab Results  Component Value Date   CREATININE 0.99 09/03/2015   BUN 23* 09/03/2015   NA 138 09/03/2015   K 3.3* 09/03/2015   CL 102 09/03/2015   CO2 22 09/03/2015    Lab Results  Component Value Date   INR 3.16* 09/03/2015   INR 2.6 08/20/2015   INR 2.7 07/16/2015   PROTIME 19.2 08/31/2008     Physical Exam: BP 183/79 mmHg  Pulse 75  Temp(Src) 98.1 F (36.7 C) (Oral)  Resp 21  SpO2 95% Constitutional: Pleasant, male with NG in place HEENT: Normocephalic and atraumatic. Conjunctivae are normal. No scleral icterus. Neck supple.  Cardiovascular: Normal rate, irregularly irregular Pulmonary/chest: Effort normal and breath sounds normal. No wheezing, rales or rhonchi. Abdominal: significantly distended, mild TTP without rebound or gaurding.  Extremities: trace edema B Neurological: Alert and oriented to person place and time. Skin: Skin is warm and dry. No rashes noted. Psychiatric: Normal mood and  affect. Behavior is normal.   ASSESSMENT AND PLAN: 82 y/o male with progressive distension and obstipation over the past several days, presented with worsening symptoms this evening. CT scan shows sigmoid volvulus. He is not acidotic and labs stable, but is at risk for vascular compromise / gangrene without intervention. We discussed options for management including surgery and endoscopy. Surgery was consulted initially who recommended endoscopic management which is reasonable at this time. I discussed the risks of sedation with him. I discussed risks / benefits of endoscopy with him to include bleeding and perforation. I quoted him a success rate of 75-95% for decompression with endoscopic therapy. We will consider placing rectal tube at time of decompression. He is typed / screened for FFP if needed. He should be admitted for observation following this procedure with surgical consult tomorrow, given high risk of recurrence of volvulus without definitive therapy.  [place] Cellar, MD [facility] Pager [phone]

## 2015-09-03 NOTE — ED Provider Notes (Addendum)
Assumed care of the patient from Dr. Tyrone Nine. Plan is to follow up CT results to determine if patient has surgical condition or obstruction. Dr. Tyrone Nine had paced NG tube which has had output of green gastric contents but patient continued to be distended. CT scan results returned and positive for sigmoid volvulus.  Patient reports he's never had surgical intervention for bowel obstruction. He had had several episodes of abdominal distention after a Nissen fundoplication some 30 years ago. He had reported that it always resolved with an NG tube. Last time that happened was 10 years ago.  Consult with Dr. Zella Richer, he advises this is a nonsurgical management with GI consultation for decompression and hospitalist admission. Patient's vital signs are stable without leukocytosis.  Consult with Dr. Havery Moros. He will see the patient for decompression of sigmoid volvulus.  Charlesetta Shanks, MD 09/03/15 [id number]  Charlesetta Shanks, MD 09/03/15 2228

## 2015-09-03 NOTE — ED Provider Notes (Signed)
CSN: [id number]     Arrival date & time 09/03/15  1127 History   First MD Initiated Contact with Patient 09/03/15 1354     Chief Complaint  Patient presents with  . Abdominal Pain  . Constipation     (Consider location/radiation/quality/duration/timing/severity/associated sxs/prior Treatment) Patient is a 82 y.o. male presenting with abdominal pain and constipation. The history is provided by the patient.  Abdominal Pain Pain location:  Generalized Pain quality: aching and bloating   Pain radiates to:  Does not radiate Pain severity:  Moderate Onset quality:  Sudden Duration:  3 days Timing:  Constant Progression:  Worsening Chronicity:  Recurrent Relieved by:  Nothing Worsened by:  Nothing tried Ineffective treatments:  None tried Associated symptoms: constipation and nausea   Associated symptoms: no chest pain, no chills, no diarrhea, no fever, no shortness of breath and no vomiting   Constipation Associated symptoms: abdominal pain and nausea   Associated symptoms: no diarrhea, no fever and no vomiting    82 yo M With a chief complaint of abdominal distention and pain. This is a recurrent issue for this patient. He had a Nissen done some 30 years ago. Has had multiple episodes where he is unable to belch due to the tightness of the repair. He normally gets an NG tube and has significant resolution of symptoms and is able to go home. Has never come to this hospital system for the same. Has had some constipation and no flatus over the past couple days as well. Denies fevers or chills.  Past Medical History  Diagnosis Date  . Unspecified essential hypertension   . Anxiety state, unspecified     past at death of spouse  . Depressive disorder, not elsewhere classified     past at death of spouse  . Other and unspecified hyperlipidemia   . Dyskinesia of esophagus   . Long term (current) use of anticoagulants   . Hiatal hernia   . Duodenitis without mention of hemorrhage   .  Unspecified gastritis and gastroduodenitis without mention of hemorrhage   . Unspecified hemorrhoids without mention of complication   . Diverticulosis of colon (without mention of hemorrhage)   . Barrett's esophagus   . Irritable bowel syndrome   . Unspecified sleep apnea   . Anemia   . Atrial fibrillation ([place])   . Skin cancer (melanoma) ([place])   . Status post dilation of esophageal narrowing   . GERD (gastroesophageal reflux disease)   . Hereditary and idiopathic peripheral neuropathy 10/23/2014   Past Surgical History  Procedure Laterality Date  . Nissen fundoplication  [id number]  . Turp vaporization  1980s  . Carpal tunnel release Bilateral 2003  . Appendectomy  1945  . Tonsillectomy    . Vasectomy    . Lumbar laminectomy    . Small bowel obsturction      x4  . Cardiac catheterization  1991  . Central decompressive laminectomy      L2-L5  . Esophagogastroduodenoscopy    . Colonoscopy    . Spinal cord stimulator implant  06/2011    Dr. Anselm Lis  . Eus  11/13/2011    Procedure: LOWER ENDOSCOPIC ULTRASOUND (EUS);  Surgeon: Milus Banister, MD;  Location: [facility];  Service: Endoscopy;  Laterality: N/A;   Family History  Problem Relation Age of Onset  . Stroke Mother   . Coronary artery disease Father   . Colon cancer Neg Hx   . Heart disease Other     [facility]  Social History  Substance Use Topics  . Smoking status: Former Smoker    Quit date: 03/31/1972  . Smokeless tobacco: Never Used  . Alcohol Use: 0.6 oz/week    1 Shots of liquor per week     Comment: ocass    Review of Systems  Constitutional: Negative for fever and chills.  HENT: Negative for congestion and facial swelling.   Eyes: Negative for discharge and visual disturbance.  Respiratory: Negative for shortness of breath.   Cardiovascular: Negative for chest pain and palpitations.  Gastrointestinal: Positive for nausea, abdominal pain and constipation. Negative for vomiting and diarrhea.  Musculoskeletal:  Negative for myalgias and arthralgias.  Skin: Negative for color change and rash.  Neurological: Negative for tremors, syncope and headaches.  Psychiatric/Behavioral: Negative for confusion and dysphoric mood.      Allergies  Horse-derived products  Home Medications   Prior to Admission medications   Medication Sig Start Date End Date Taking? Authorizing Provider  amLODipine (NORVASC) 10 MG tablet Take 1 tablet (10 mg total) by mouth daily. 01/23/15  Yes Lelon Perla, MD  Calcium 600 MG tablet Take 1,200 mg by mouth daily. Every other day   Yes Historical Provider, MD  cholecalciferol (VITAMIN D) 1000 UNITS tablet Take 1,000 Units by mouth daily.     Yes Historical Provider, MD  Ferrous Sulfate (IRON) 325 (65 FE) MG TABS Take 1 tablet by mouth daily.     Yes Historical Provider, MD  fish oil-omega-3 fatty acids 1000 MG capsule Take 2 capsules by mouth 2 (two) times daily.    Yes Historical Provider, MD  FLUoxetine (PROZAC) 20 MG tablet Take 20 mg by mouth daily.   Yes Historical Provider, MD  furosemide (LASIX) 20 MG tablet Take 1 tablet (20 mg total) by mouth daily. 11/16/14  Yes Lelon Perla, MD  losartan (COZAAR) 100 MG tablet Take 1 tablet (100 mg total) by mouth daily. 11/16/14  Yes Lelon Perla, MD  Magnesium 100 MG CAPS Take 100 mg by mouth daily.    Yes Historical Provider, MD  Multiple Vitamin (MULTIVITAMIN) tablet Take 1 tablet by mouth daily.   Yes Historical Provider, MD  omeprazole (PRILOSEC) 20 MG capsule Take 20 mg by mouth daily.     Yes Historical Provider, MD  potassium chloride (KLOR-CON M10) 10 MEQ tablet TAKE 1 TABLET BY MOUTH EVERY DAY Patient taking differently: Take 10 mEq by mouth daily.  05/01/15  Yes Lelon Perla, MD  pravastatin (PRAVACHOL) 10 MG tablet Take 1 tablet (10 mg total) by mouth daily. 12/28/14  Yes Lelon Perla, MD  vitamin E 400 UNIT capsule Take 400 Units by mouth daily.     Yes Historical Provider, MD  warfarin (COUMADIN) 5 MG  tablet TAKE 1 TO 1 & 1/2 TABLETS BY MOUTH DAILY AS DIRECTED BY COUMADIN CLINIC Patient taking differently: TAKE 5 MG BY MOUTH DAILY EXCEPT TAKE 7.5 MG ON M,W,FRI 06/27/15  Yes Lelon Perla, MD   BP 149/74 mmHg  Pulse 74  Temp(Src) 99.8 F (37.7 C) (Oral)  Resp 18  Ht 5\' 11"  (1.803 m)  Wt 173 lb 8 oz (78.7 kg)  BMI 24.21 kg/m2  SpO2 97% Physical Exam  Constitutional: He is oriented to person, place, and time. He appears well-developed and well-nourished.  HENT:  Head: Normocephalic and atraumatic.  Eyes: EOM are normal. Pupils are equal, round, and reactive to light.  Neck: Normal range of motion. Neck supple. No JVD present.  Cardiovascular:  Normal rate and regular rhythm.  Exam reveals no gallop and no friction rub.   No murmur heard. Pulmonary/Chest: No respiratory distress. He has no wheezes.  Abdominal: He exhibits distension (marked, tympanitic). There is no tenderness. There is no rebound and no guarding.  Musculoskeletal: Normal range of motion.  Neurological: He is alert and oriented to person, place, and time.  Skin: No rash noted. No pallor.  Psychiatric: He has a normal mood and affect. His behavior is normal.  Nursing note and vitals reviewed.   ED Course  NG placement Date/Time: 09/03/2015 4:51 PM Performed by: Tyrone Nine Lounell Schumacher Authorized by: Deno Etienne Consent: Verbal consent obtained. Risks and benefits: risks, benefits and alternatives were discussed Consent given by: patient Required items: required blood products, implants, devices, and special equipment available Patient identity confirmed: verbally with patient Time out: Immediately prior to procedure a "time out" was called to verify the correct patient, procedure, equipment, support staff and site/side marked as required. Local anesthesia used: yes Anesthesia: local infiltration Local anesthetic: topical anesthetic Patient sedated: no Patient tolerance: Patient tolerated the procedure well with no  immediate complications   (including critical care time) Labs Review Labs Reviewed  COMPREHENSIVE METABOLIC PANEL - Abnormal; Notable for the following:    Potassium 3.3 (*)    Glucose, Bld 107 (*)    BUN 23 (*)    Total Protein 8.2 (*)    AST 48 (*)    Total Bilirubin 2.2 (*)    All other components within normal limits  PROTIME-INR - Abnormal; Notable for the following:    Prothrombin Time 30.9 (*)    INR 3.16 (*)    All other components within normal limits  CBC - Abnormal; Notable for the following:    RBC 4.15 (*)    All other components within normal limits  BASIC METABOLIC PANEL - Abnormal; Notable for the following:    Potassium 3.0 (*)    Calcium 8.4 (*)    All other components within normal limits  PROTIME-INR - Abnormal; Notable for the following:    Prothrombin Time 32.3 (*)    INR 3.35 (*)    All other components within normal limits  PROTIME-INR - Abnormal; Notable for the following:    Prothrombin Time 18.6 (*)    INR 1.55 (*)    All other components within normal limits  BASIC METABOLIC PANEL - Abnormal; Notable for the following:    Potassium 3.1 (*)    Glucose, Bld 113 (*)    Calcium 8.6 (*)    All other components within normal limits  CBC WITH DIFFERENTIAL/PLATELET  MAGNESIUM  I-STAT CG4 LACTIC ACID, ED  I-STAT CG4 LACTIC ACID, ED  TYPE AND SCREEN  ABO/RH    Imaging Review Dg Abd 1 View  09/04/2015  CLINICAL DATA:  Sigmoidoscopy for sigmoid volvulus. EXAM: ABDOMEN - 1 VIEW COMPARISON:  09/03/2015 CT FINDINGS: Pigtail catheter has been placed in the rectum. Distal colonic distention has improved. Proximal colonic distention remains prominent. No supine evidence of pneumoperitoneum. Nasogastric tube with tip at the distal stomach. IMPRESSION: After endoscopic decompression distal colonic distention is improved. Proximal colonic distention remains prominent. Electronically Signed   By: Monte Fantasia M.D.   On: 09/04/2015 03:38   Dg Abd 1  View  09/03/2015  CLINICAL DATA:  Evaluate NG tube placement EXAM: ABDOMEN - 1 VIEW COMPARISON:  KUB from earlier today FINDINGS: The distal tip of the NG tube is just below the GE junction with side port  above the GE junction. Recommend advancement. Diffuse dilated loops of large and small bowel again identified. IMPRESSION: The side port of the NG tube is above the GE junction. The patient may benefit from advancing the NG tube several cm. Continued bowel distention. Electronically Signed   By: Dorise Bullion III M.D   On: 09/03/2015 16:59   Ct Abdomen Pelvis W Contrast  09/03/2015  CLINICAL DATA:  Generalized abdominal pain EXAM: CT ABDOMEN AND PELVIS WITH CONTRAST TECHNIQUE: Multidetector CT imaging of the abdomen and pelvis was performed using the standard protocol following bolus administration of intravenous contrast. CONTRAST:  100 mL Isovue-370 COMPARISON:  Plain film from earlier in the same day FINDINGS: Lung bases demonstrate some minimal right basilar atelectatic changes. No focal infiltrate or sizable effusion is noted. The liver, gallbladder, spleen, adrenal glands and pancreas are within normal limits. Kidneys are well visualized bilaterally with a normal enhancement pattern. Delayed images demonstrate normal excretion of contrast and small para pelvic cysts bilaterally. A nasogastric catheter is noted coiled within the stomach. There is significant distension of the colon identified with an abrupt caliber change in the rectosigmoid with swirling vasculature surrounding the area of narrowing consistent with sigmoid volvulus. Significant redundancy of the sigmoid is noted. Small bowel as visualized is within normal limits. No pneumatosis or free air is identified at this time. The appendix is not visualize consistent with a prior surgical history. The bladder is well distended. No pelvic mass lesion or sidewall abnormality is noted. No acute bony abnormality is noted. Postsurgical changes in the  lumbar spine are seen IMPRESSION: Changes consistent with sigmoid volvulus with significant colonic distention. No free air or pneumatosis is noted at this time. Mild bibasilar atelectasis. No other acute abnormality is noted. Critical Value/emergent results were called by telephone at the time of interpretation on 09/03/2015 at 7:13 pm to Dr. Johnney Killian , who verbally acknowledged these results. Electronically Signed   By: Inez Catalina M.D.   On: 09/03/2015 19:15   Dg Abd Acute W/chest  09/03/2015  CLINICAL DATA:  Constipation and increasing abdominal distention. EXAM: DG ABDOMEN ACUTE W/ 1V CHEST COMPARISON:  Chest x-ray 09/13/2014. FINDINGS: The heart is mildly enlarged but stable. Stable tortuosity and calcification of the thoracic aorta. Bibasilar scarring changes and eventration of the right hemidiaphragm. Stable scarring changes at the left lung base. The bony thorax is intact. They spinal cord stimulator is noted. Dilated small bowel and colon consistent with a diffuse ileus. No findings for small bowel obstruction or free air. Lower lumbar fusion hardware noted. IMPRESSION: Diffusely distended, gas-filled small bowel and colon consistent with a diffuse ileus. No definite free air. No acute cardiopulmonary findings. Electronically Signed   By: Marijo Sanes M.D.   On: 09/03/2015 15:03   Dg Abd Portable 1v  09/04/2015  CLINICAL DATA:  Abdominal distension with sigmoid volvulus EXAM: PORTABLE ABDOMEN - 1 VIEW COMPARISON:  09/04/2015 at 2:59 a.m. FINDINGS: NG tube again projects over the stomach. No change in the position of rectal catheter which projects with tip in the midline of the inferior pelvis. Diffuse gaseous distention of small and large bowel again identified including large bowel distention in the midline of the mid to upper abdomen to a diameter of 13.5 cm. IMPRESSION: No change in appearance when compared to prior study with significant persistent gaseous distensionof the large bowel.  Electronically Signed   By: Skipper Cliche M.D.   On: 09/04/2015 09:48   I have personally reviewed and evaluated these  images and lab results as part of my medical decision-making.   EKG Interpretation   Date/Time:  Tuesday September 04 2015 01:47:10 EDT Ventricular Rate:  72 PR Interval:    QRS Duration: 149 QT Interval:  498 QTC Calculation: 545 R Axis:   -58 Text Interpretation:  Atrial fibrillation RBBB and LAFB Rate is slower  Confirmed by Florina Ou  MD, Jenny Reichmann ([id number]) on 09/04/2015 1:51:43 AM Also  confirmed by Florina Ou  MD, Jenny Reichmann ([id number]), editor WALKER, CCT, [place] ([id number])   on 09/04/2015 7:00:25 AM      MDM   Final diagnoses:  Encounter for nasogastric (NG) tube placement  Sigmoid volvulus ([place])    82 yo M with a chief complaints of abdominal distention. Patient had multiple episodes like this in the past. Will attempt to place an NG tube obtain a KUB laboratory evaluation.  KUB with ileus, NG placed, will obs patient for improvement based on prior hx.   CT ordered.  Turned over to Dr. Fransico Michael.   The patients results and plan were reviewed and discussed.   Any x-rays performed were independently reviewed by myself.   Differential diagnosis were considered with the presenting HPI.  Medications  famotidine (PEPCID) IVPB 20 mg premix (20 mg Intravenous Given 09/04/15 2200)  hydrALAZINE (APRESOLINE) injection 20 mg (20 mg Intravenous Given 09/04/15 2157)  sodium chloride flush (NS) 0.9 % injection 3 mL (3 mLs Intravenous Given 09/04/15 2159)  acetaminophen (TYLENOL) tablet 650 mg (not administered)    Or  acetaminophen (TYLENOL) suppository 650 mg (not administered)  morphine 2 MG/ML injection 2 mg (not administered)  ondansetron (ZOFRAN) tablet 4 mg (not administered)    Or  ondansetron (ZOFRAN) injection 4 mg (not administered)  0.9 % NaCl with KCl 20 mEq/ L  infusion ( Intravenous Rate/Dose Change 09/04/15 1049)  antiseptic oral rinse (CPC / CETYLPYRIDINIUM CHLORIDE 0.05%) solution  7 mL (7 mLs Mouth Rinse Given 09/04/15 1819)  polyethylene glycol (MIRALAX / GLYCOLAX) packet 17 g (17 g Oral Given 09/04/15 2157)  phenol (CHLORASEPTIC) mouth spray 1 spray (1 spray Mouth/Throat Given 09/04/15 2253)  sodium chloride 0.9 % bolus 1,000 mL (0 mLs Intravenous Stopped 09/03/15 1530)  lidocaine (XYLOCAINE) 2 % viscous mouth solution 15 mL (15 mLs Mouth/Throat Given 09/03/15 1655)  iopamidol (ISOVUE-370) 76 % injection 100 mL (100 mLs Intravenous Contrast Given 09/03/15 1839)  sodium chloride 0.9 % bolus 500 mL (0 mLs Intravenous Stopped 09/03/15 2130)  phytonadione (VITAMIN K) 1 mg in dextrose 5 % 50 mL IVPB (1 mg Intravenous Given 09/04/15 1212)  potassium chloride SA (K-DUR,KLOR-CON) CR tablet 40 mEq (40 mEq Oral Given 09/04/15 2157)    Filed Vitals:   09/04/15 1429 09/04/15 2054 09/04/15 2253 09/05/15 0445  BP: 140/54 177/78 149/76 149/74  Pulse: 52 70  74  Temp: 98.6 F (37 C) 98.8 F (37.1 C)  99.8 F (37.7 C)  TempSrc: Oral Oral  Oral  Resp:  18  18  Height:      Weight:      SpO2: 98% 97%  97%    Final diagnoses:  Encounter for nasogastric (NG) tube placement  Sigmoid volvulus ([place])       Deno Etienne, DO 09/05/15 [id number]

## 2015-09-03 NOTE — Interval H&P Note (Signed)
History and Physical Interval Note:  09/03/2015 11:51 PM  Ian Sullivan.  has presented today for surgery, with the diagnosis of Sigmoid volvulus  The various methods of treatment have been discussed with the patient and family. After consideration of risks, benefits and other options for treatment, the patient has consented to  Procedure(s): FLEXIBLE SIGMOIDOSCOPY (N/A) as a surgical intervention .  The patient's history has been reviewed, patient examined, no change in status, stable for surgery.  I have reviewed the patient's chart and labs.  Questions were answered to the patient's satisfaction.     [name]

## 2015-09-04 ENCOUNTER — Observation Stay (HOSPITAL_COMMUNITY): Payer: Medicare Other

## 2015-09-04 ENCOUNTER — Encounter (HOSPITAL_COMMUNITY)
Admission: EM | Disposition: A | Discharge status: Home / Self Care | Payer: Self-pay | Source: Home / Self Care | Attending: Internal Medicine

## 2015-09-04 DIAGNOSIS — R933 Abnormal findings on diagnostic imaging of other parts of digestive tract: Secondary | ICD-10-CM | POA: Diagnosis not present

## 2015-09-04 DIAGNOSIS — I480 Paroxysmal atrial fibrillation: Secondary | ICD-10-CM | POA: Diagnosis not present

## 2015-09-04 DIAGNOSIS — Z79899 Other long term (current) drug therapy: Secondary | ICD-10-CM | POA: Diagnosis not present

## 2015-09-04 DIAGNOSIS — K589 Irritable bowel syndrome without diarrhea: Secondary | ICD-10-CM | POA: Diagnosis present

## 2015-09-04 DIAGNOSIS — G609 Hereditary and idiopathic neuropathy, unspecified: Secondary | ICD-10-CM | POA: Diagnosis present

## 2015-09-04 DIAGNOSIS — K5900 Constipation, unspecified: Secondary | ICD-10-CM | POA: Diagnosis not present

## 2015-09-04 DIAGNOSIS — Z85820 Personal history of malignant melanoma of skin: Secondary | ICD-10-CM | POA: Diagnosis not present

## 2015-09-04 DIAGNOSIS — K219 Gastro-esophageal reflux disease without esophagitis: Secondary | ICD-10-CM | POA: Diagnosis present

## 2015-09-04 DIAGNOSIS — G8929 Other chronic pain: Secondary | ICD-10-CM | POA: Diagnosis present

## 2015-09-04 DIAGNOSIS — Z87891 Personal history of nicotine dependence: Secondary | ICD-10-CM | POA: Diagnosis not present

## 2015-09-04 DIAGNOSIS — F411 Generalized anxiety disorder: Secondary | ICD-10-CM | POA: Diagnosis present

## 2015-09-04 DIAGNOSIS — M545 Low back pain: Secondary | ICD-10-CM | POA: Diagnosis present

## 2015-09-04 DIAGNOSIS — Z9049 Acquired absence of other specified parts of digestive tract: Secondary | ICD-10-CM | POA: Diagnosis not present

## 2015-09-04 DIAGNOSIS — I080 Rheumatic disorders of both mitral and aortic valves: Secondary | ICD-10-CM | POA: Diagnosis present

## 2015-09-04 DIAGNOSIS — I5032 Chronic diastolic (congestive) heart failure: Secondary | ICD-10-CM | POA: Diagnosis present

## 2015-09-04 DIAGNOSIS — E876 Hypokalemia: Secondary | ICD-10-CM | POA: Diagnosis present

## 2015-09-04 DIAGNOSIS — G4730 Sleep apnea, unspecified: Secondary | ICD-10-CM | POA: Diagnosis present

## 2015-09-04 DIAGNOSIS — R791 Abnormal coagulation profile: Secondary | ICD-10-CM | POA: Diagnosis present

## 2015-09-04 DIAGNOSIS — D179 Benign lipomatous neoplasm, unspecified: Secondary | ICD-10-CM | POA: Diagnosis present

## 2015-09-04 DIAGNOSIS — Z8249 Family history of ischemic heart disease and other diseases of the circulatory system: Secondary | ICD-10-CM | POA: Diagnosis not present

## 2015-09-04 DIAGNOSIS — Z7901 Long term (current) use of anticoagulants: Secondary | ICD-10-CM

## 2015-09-04 DIAGNOSIS — I10 Essential (primary) hypertension: Secondary | ICD-10-CM | POA: Diagnosis not present

## 2015-09-04 DIAGNOSIS — K5909 Other constipation: Secondary | ICD-10-CM | POA: Insufficient documentation

## 2015-09-04 DIAGNOSIS — K562 Volvulus: Secondary | ICD-10-CM | POA: Diagnosis present

## 2015-09-04 DIAGNOSIS — R1084 Generalized abdominal pain: Secondary | ICD-10-CM | POA: Diagnosis present

## 2015-09-04 DIAGNOSIS — F329 Major depressive disorder, single episode, unspecified: Secondary | ICD-10-CM | POA: Diagnosis present

## 2015-09-04 DIAGNOSIS — M810 Age-related osteoporosis without current pathological fracture: Secondary | ICD-10-CM | POA: Diagnosis present

## 2015-09-04 DIAGNOSIS — I110 Hypertensive heart disease with heart failure: Secondary | ICD-10-CM | POA: Diagnosis not present

## 2015-09-04 DIAGNOSIS — I4510 Unspecified right bundle-branch block: Secondary | ICD-10-CM | POA: Diagnosis present

## 2015-09-04 DIAGNOSIS — E785 Hyperlipidemia, unspecified: Secondary | ICD-10-CM | POA: Diagnosis present

## 2015-09-04 DIAGNOSIS — G5790 Unspecified mononeuropathy of unspecified lower limb: Secondary | ICD-10-CM | POA: Diagnosis present

## 2015-09-04 DIAGNOSIS — K2270 Barrett's esophagus without dysplasia: Secondary | ICD-10-CM | POA: Diagnosis present

## 2015-09-04 DIAGNOSIS — Z01818 Encounter for other preprocedural examination: Secondary | ICD-10-CM | POA: Diagnosis not present

## 2015-09-04 DIAGNOSIS — Z823 Family history of stroke: Secondary | ICD-10-CM | POA: Diagnosis not present

## 2015-09-04 DIAGNOSIS — I482 Chronic atrial fibrillation: Secondary | ICD-10-CM | POA: Diagnosis not present

## 2015-09-04 DIAGNOSIS — I452 Bifascicular block: Secondary | ICD-10-CM | POA: Diagnosis not present

## 2015-09-04 HISTORY — PX: FLEXIBLE SIGMOIDOSCOPY: SHX5431

## 2015-09-04 LAB — BASIC METABOLIC PANEL
Anion gap: 11 (ref 5–15)
BUN: 16 mg/dL (ref 6–20)
CHLORIDE: 103 mmol/L (ref 101–111)
CO2: 25 mmol/L (ref 22–32)
CREATININE: 0.61 mg/dL (ref 0.61–1.24)
Calcium: 8.4 mg/dL — ABNORMAL LOW (ref 8.9–10.3)
GFR calc Af Amer: >60 mL/min (ref >60)
Glucose, Bld: 92 mg/dL (ref 65–99)
POTASSIUM: 3 mmol/L — AB (ref 3.5–5.1)
SODIUM: 139 mmol/L (ref 135–145)

## 2015-09-04 LAB — CBC
HEMATOCRIT: 39.5 % (ref 39.0–52.0)
HEMOGLOBIN: 13.6 g/dL (ref 13.0–17.0)
MCH: 32.8 pg (ref 26.0–34.0)
MCHC: 34.4 g/dL (ref 30.0–36.0)
MCV: 95.2 fL (ref 78.0–100.0)
Platelets: 189 10*3/uL (ref 150–400)
RBC: 4.15 MIL/uL — AB (ref 4.22–5.81)
RDW: 14.1 % (ref 11.5–15.5)
WBC: 7.2 10*3/uL (ref 4.0–10.5)

## 2015-09-04 LAB — TYPE AND SCREEN
ABO/RH(D): AB POS
Antibody Screen: NEGATIVE

## 2015-09-04 LAB — PROTIME-INR
INR: 3.35 — ABNORMAL HIGH (ref 0.00–1.49)
PROTHROMBIN TIME: 32.3 s — AB (ref 11.6–15.2)

## 2015-09-04 LAB — ABO/RH: ABO/RH(D): AB POS

## 2015-09-04 LAB — MAGNESIUM: MAGNESIUM: 2 mg/dL (ref 1.7–2.4)

## 2015-09-04 SURGERY — SIGMOIDOSCOPY, FLEXIBLE
Anesthesia: Moderate Sedation

## 2015-09-04 MED ORDER — ACETAMINOPHEN 325 MG PO TABS: 650.0000 mg | ORAL_TABLET | Freq: Four times a day (QID) | ORAL | Status: DC | PRN

## 2015-09-04 MED ORDER — ONDANSETRON HCL 4 MG PO TABS: 4.0000 mg | ORAL_TABLET | Freq: Four times a day (QID) | ORAL | Status: DC | PRN

## 2015-09-04 MED ORDER — PHENOL 1.4 % MT LIQD
1.0000 | OROMUCOSAL | Status: DC | PRN
  Administered 2015-09-04: 1 via OROMUCOSAL
  Filled 2015-09-04: qty 177

## 2015-09-04 MED ORDER — SODIUM CHLORIDE 0.9% FLUSH
3.0000 mL | Freq: Two times a day (BID) | INTRAVENOUS | Status: DC
  Administered 2015-09-04 – 2015-09-06 (×5): 3 mL via INTRAVENOUS

## 2015-09-04 MED ORDER — HYDRALAZINE HCL 20 MG/ML IJ SOLN
20.0000 mg | Freq: Four times a day (QID) | INTRAMUSCULAR | Status: DC | PRN
  Administered 2015-09-04: 20 mg via INTRAVENOUS
  Filled 2015-09-04: qty 1

## 2015-09-04 MED ORDER — ONDANSETRON HCL 4 MG/2ML IJ SOLN: 4.0000 mg | Freq: Four times a day (QID) | INTRAMUSCULAR | Status: DC | PRN

## 2015-09-04 MED ORDER — FAMOTIDINE IN NACL 20-0.9 MG/50ML-% IV SOLN
20.0000 mg | Freq: Two times a day (BID) | INTRAVENOUS | Status: DC
  Administered 2015-09-04 – 2015-09-06 (×6): 20 mg via INTRAVENOUS
  Filled 2015-09-04 (×7): qty 50

## 2015-09-04 MED ORDER — FENTANYL CITRATE (PF) 100 MCG/2ML IJ SOLN
INTRAMUSCULAR | Status: DC | PRN
  Administered 2015-09-03 – 2015-09-04 (×2): 25 ug via INTRAVENOUS

## 2015-09-04 MED ORDER — VITAMIN K1 10 MG/ML IJ SOLN
1.0000 mg | Freq: Once | INTRAVENOUS | Status: AC
  Administered 2015-09-04: 1 mg via INTRAVENOUS
  Filled 2015-09-04: qty 0.1

## 2015-09-04 MED ORDER — POTASSIUM CHLORIDE IN NACL 20-0.9 MEQ/L-% IV SOLN
INTRAVENOUS | Status: AC
  Administered 2015-09-04: 05:00:00 via INTRAVENOUS
  Filled 2015-09-04: qty 1000

## 2015-09-04 MED ORDER — POLYETHYLENE GLYCOL 3350 17 G PO PACK
17.0000 g | PACK | Freq: Two times a day (BID) | ORAL | Status: DC
  Administered 2015-09-04 – 2015-09-05 (×4): 17 g via ORAL
  Filled 2015-09-04 (×5): qty 1

## 2015-09-04 MED ORDER — PHYTONADIONE 5 MG PO TABS
2.5000 mg | ORAL_TABLET | Freq: Once | ORAL | Status: DC
  Filled 2015-09-04: qty 1

## 2015-09-04 MED ORDER — MIDAZOLAM HCL 10 MG/2ML IJ SOLN
INTRAMUSCULAR | Status: DC | PRN
  Administered 2015-09-03: 2 mg via INTRAVENOUS
  Administered 2015-09-04: 1 mg via INTRAVENOUS

## 2015-09-04 MED ORDER — MORPHINE SULFATE (PF) 2 MG/ML IV SOLN: 2.0000 mg | INTRAVENOUS | Status: DC | PRN

## 2015-09-04 MED ORDER — POTASSIUM CHLORIDE CRYS ER 20 MEQ PO TBCR
40.0000 meq | EXTENDED_RELEASE_TABLET | Freq: Three times a day (TID) | ORAL | Status: AC
  Administered 2015-09-04 (×3): 40 meq via ORAL
  Filled 2015-09-04 (×3): qty 2

## 2015-09-04 MED ORDER — ACETAMINOPHEN 650 MG RE SUPP: 650.0000 mg | Freq: Four times a day (QID) | RECTAL | Status: DC | PRN

## 2015-09-04 MED ORDER — POTASSIUM CHLORIDE 10 MEQ/100ML IV SOLN: 10.0000 meq | INTRAVENOUS | Status: DC

## 2015-09-04 MED ORDER — CETYLPYRIDINIUM CHLORIDE 0.05 % MT LIQD
7.0000 mL | Freq: Two times a day (BID) | OROMUCOSAL | Status: DC
  Administered 2015-09-04 – 2015-09-06 (×6): 7 mL via OROMUCOSAL

## 2015-09-04 NOTE — Op Note (Signed)
[facility] Patient Name: Onecimo Purchase Procedure Date: 09/03/2015 MRN: [id number] Attending MD: Carlota Raspberry. Havery Moros , MD Date of Birth: Mar 07, 1928 CSN: [id number] Age: 82 Admit Type: Outpatient Procedure:                Flexible Sigmoidoscopy Indications:              Abnormal CT of the GI tract, Suspected volvulus,                            For therapy of volvulus Providers:                Remo Lipps P. Havery Moros, MD, Kingsley Plan, RN,                            Corie Dalton, Technician Referring MD:              Medicines:                Fentanyl 50 micrograms IV, Midazolam 3 mg IV, None Complications:            No immediate complications. Estimated blood loss:                            None. Estimated Blood Loss:     Estimated blood loss: none. Procedure:                Pre-Anesthesia Assessment:                           - Prior to the procedure, a History and Physical                            was performed, and patient medications and                            allergies were reviewed. The patient's tolerance of                            previous anesthesia was also reviewed. The risks                            and benefits of the procedure and the sedation                            options and risks were discussed with the patient.                            All questions were answered, and informed consent                            was obtained. Prior Anticoagulants: The patient has                            taken Coumadin (warfarin), last dose was 3 days  prior to procedure. ASA Grade Assessment: III - A                            patient with severe systemic disease. After                            reviewing the risks and benefits, the patient was                            deemed in satisfactory condition to undergo the                            procedure.                           After obtaining informed consent,  the scope was                            passed under direct vision. The [id number]                            scope was introduced through the anus and advanced                            to the the descending colon. The flexible                            sigmoidoscopy was technically difficult and complex                            due to poor bowel prep. The patient tolerated the                            procedure well. The quality of the bowel                            preparation was poor. Scope In: Scope Out: Findings:      The perianal and digital rectal examinations were normal.      A large amount of stool was found in the rectum, in the recto-sigmoid       colon, in the sigmoid colon and in the descending colon, interfering       with visualization, making this a challenging exam      A stenosis was found in the recto-sigmoid colon, suspected site of       volvulus and was easily traversed. The mucosa distal and proximal to the       area appeared normal without any gangrenous or ischemic changes. The       mucosa itself however was poorly visualized and unable to clearly       evaluate the area due to the prep, but no overt mass lesion was noted. A       colonic decompression tube was placed and traversed to the sigmoid       colon, although was challenging to pass in this area and coiled in the  rectum with multiple attempts. Overall the area was traversed, air was       suctioned from the descending and sigmoid colon to collapse the colon.       Visualization was poor throughout to clearly evaluate the mucosa in all       portions of the colon. The patient's abdominal distension was       considerably improved following the endoscopy. Impression:               - Preparation of the colon was poor.                           - Stool in the rectum, in the recto-sigmoid colon,                            in the sigmoid colon and in the descending colon.                            - Suspected volvulus in the recto-sigmoid colonm,                            traversed, air suctioned from descending / sigmoid                            colon, rectal tube placed although high risk for                            tube migration. Moderate Sedation:      Moderate (conscious) sedation was administered by the endoscopy nurse       and supervised by the endoscopist. The following parameters were       monitored: oxygen saturation, heart rate, blood pressure, and response       to care. Total physician intraservice time was 34 minutes. Recommendation:           - Admit the patient to hospital ward for ongoing                            care.                           - Abdominal xray now                           - NPO                           - General surgery consultation                           - GI service will reassess the patient in the                            morning Procedure Code(s):        --- Professional ---                           [id number], Sigmoidoscopy, flexible; with decompression                            (  for pathologic distention) (eg, volvulus,                            megacolon), including placement of decompression                            tube, when performed                           [id number], Moderate sedation services provided by the                            same physician or other qualified health care                            professional performing the diagnostic or                            therapeutic service that the sedation supports,                            requiring the presence of an independent trained                            observer to assist in the monitoring of the                            patient's level of consciousness and physiological                            status; initial 15 minutes of intraservice time,                            patient age 27 years or older                            [phone], Moderate sedation services; each additional                            15 minutes intraservice time Diagnosis Code(s):        --- Professional ---                           K56.69, Other intestinal obstruction                           K56.2, Volvulus                           R93.3, Abnormal findings on diagnostic imaging of                            other parts of digestive tract CPT copyright 2016 [employer]. All rights reserved. The codes documented in this report are preliminary and upon coder review may  be revised to meet current compliance requirements. Remo Lipps P. Armbruster,  MD 09/04/2015 12:45:46 AM This report has been signed electronically. Number of Addenda: 0

## 2015-09-04 NOTE — Consult Note (Signed)
Reason for Consult: Sigmoid volvulus Referring Physician:  Dr. Penelope Galas. is an 82 y.o. male.  HPI:  He has a history of chronic constipation and presented to the emergency department with progressively increasing abdominal distention and discomfort and failure to have a bowel movement.  Evaluation included plain films and CT scan. He was hurting mostly in his left lower quadrant. CT scan was consistent with a sigmoid volvulus. Dr. Havery Moros performed a flexible sigmoidoscopy and was able to reduce the volvulus and evacuate a large amount of gas and stool. Patient states he feels significantly better and feels like his abdominal size is normal. He has had a bowel movement since the procedure. He is no longer having abdominal pain. He denies having any fever. He was having bilious vomiting.  His son is in the room with him.  Of note is that he has chronic atrial fibrillation and is on chronic warfarin anticoagulation.  Past Medical History  Diagnosis Date  . Unspecified essential hypertension   . Anxiety state, unspecified     past at death of spouse  . Depressive disorder, not elsewhere classified     past at death of spouse  . Other and unspecified hyperlipidemia   . Dyskinesia of esophagus   . Long term (current) use of anticoagulants   . Hiatal hernia   . Duodenitis without mention of hemorrhage   . Unspecified gastritis and gastroduodenitis without mention of hemorrhage   . Unspecified hemorrhoids without mention of complication   . Diverticulosis of colon (without mention of hemorrhage)   . Barrett's esophagus   . Irritable bowel syndrome   . Unspecified sleep apnea   . Anemia   . Atrial fibrillation ([place])   . Skin cancer (melanoma) ([place])   . Status post dilation of esophageal narrowing   . GERD (gastroesophageal reflux disease)   . Hereditary and idiopathic peripheral neuropathy 10/23/2014    Past Surgical History  Procedure Laterality Date  . Nissen  fundoplication  9937  . Turp vaporization  1980s  . Carpal tunnel release Bilateral 2003  . Appendectomy  1945  . Tonsillectomy    . Vasectomy    . Lumbar laminectomy    . Small bowel obsturction      x4  . Cardiac catheterization  1991  . Central decompressive laminectomy      L2-L5  . Esophagogastroduodenoscopy    . Colonoscopy    . Spinal cord stimulator implant  06/2011    Dr. Anselm Lis  . Eus  11/13/2011    Procedure: LOWER ENDOSCOPIC ULTRASOUND (EUS);  Surgeon: Milus Banister, MD;  Location: [facility];  Service: Endoscopy;  Laterality: N/A;    Family History  Problem Relation Age of Onset  . Stroke Mother   . Coronary artery disease Father   . Colon cancer Neg Hx   . Heart disease Other     [facility]    Social History:  reports that he quit smoking about 43 years ago. He has never used smokeless tobacco. He reports that he drinks about 0.6 oz of alcohol per week. He reports that he does not use illicit drugs.  Allergies:  Allergies  Allergen Reactions  . Horse-Derived Products     Prior to Admission medications   Medication Sig Start Date End Date Taking? Authorizing Provider  amLODipine (NORVASC) 10 MG tablet Take 1 tablet (10 mg total) by mouth daily. 01/23/15  Yes Lelon Perla, MD  Calcium 600 MG tablet Take 1,200  mg by mouth daily. Every other day   Yes Historical Provider, MD  cholecalciferol (VITAMIN D) 1000 UNITS tablet Take 1,000 Units by mouth daily.     Yes Historical Provider, MD  Ferrous Sulfate (IRON) 325 (65 FE) MG TABS Take 1 tablet by mouth daily.     Yes Historical Provider, MD  fish oil-omega-3 fatty acids 1000 MG capsule Take 2 capsules by mouth 2 (two) times daily.    Yes Historical Provider, MD  FLUoxetine (PROZAC) 20 MG tablet Take 20 mg by mouth daily.   Yes Historical Provider, MD  furosemide (LASIX) 20 MG tablet Take 1 tablet (20 mg total) by mouth daily. 11/16/14  Yes Lelon Perla, MD  losartan (COZAAR) 100 MG tablet Take 1 tablet (100 mg  total) by mouth daily. 11/16/14  Yes Lelon Perla, MD  Magnesium 100 MG CAPS Take 100 mg by mouth daily.    Yes Historical Provider, MD  Multiple Vitamin (MULTIVITAMIN) tablet Take 1 tablet by mouth daily.   Yes Historical Provider, MD  omeprazole (PRILOSEC) 20 MG capsule Take 20 mg by mouth daily.     Yes Historical Provider, MD  potassium chloride (KLOR-CON M10) 10 MEQ tablet TAKE 1 TABLET BY MOUTH EVERY DAY Patient taking differently: Take 10 mEq by mouth daily.  05/01/15  Yes Lelon Perla, MD  pravastatin (PRAVACHOL) 10 MG tablet Take 1 tablet (10 mg total) by mouth daily. 12/28/14  Yes Lelon Perla, MD  vitamin E 400 UNIT capsule Take 400 Units by mouth daily.     Yes Historical Provider, MD  warfarin (COUMADIN) 5 MG tablet TAKE 1 TO 1 & 1/2 TABLETS BY MOUTH DAILY AS DIRECTED BY COUMADIN CLINIC Patient taking differently: TAKE 5 MG BY MOUTH DAILY EXCEPT TAKE 7.5 MG ON M,W,FRI 06/27/15  Yes Lelon Perla, MD     Results for orders placed or performed during the hospital encounter of 09/03/15 (from the past 48 hour(s))  CBC with Differential     Status: None   Collection Time: 09/03/15  2:15 PM  Result Value Ref Range   WBC 8.9 4.0 - 10.5 K/uL   RBC 4.61 4.22 - 5.81 MIL/uL   Hemoglobin 15.0 13.0 - 17.0 g/dL   HCT 42.6 39.0 - 52.0 %   MCV 92.4 78.0 - 100.0 fL   MCH 32.5 26.0 - 34.0 pg   MCHC 35.2 30.0 - 36.0 g/dL   RDW 13.9 11.5 - 15.5 %   Platelets 203 150 - 400 K/uL   Neutrophils Relative % 76 %   Neutro Abs 6.8 1.7 - 7.7 K/uL   Lymphocytes Relative 13 %   Lymphs Abs 1.1 0.7 - 4.0 K/uL   Monocytes Relative 11 %   Monocytes Absolute 0.9 0.1 - 1.0 K/uL   Eosinophils Relative 0 %   Eosinophils Absolute 0.0 0.0 - 0.7 K/uL   Basophils Relative 0 %   Basophils Absolute 0.0 0.0 - 0.1 K/uL  Comprehensive metabolic panel     Status: Abnormal   Collection Time: 09/03/15  2:15 PM  Result Value Ref Range   Sodium 138 135 - 145 mmol/L   Potassium 3.3 (L) 3.5 - 5.1 mmol/L    Chloride 102 101 - 111 mmol/L   CO2 22 22 - 32 mmol/L   Glucose, Bld 107 (H) 65 - 99 mg/dL   BUN 23 (H) 6 - 20 mg/dL   Creatinine, Ser 0.99 0.61 - 1.24 mg/dL   Calcium 9.4 8.9 - 10.3  mg/dL   Total Protein 8.2 (H) 6.5 - 8.1 g/dL   Albumin 4.9 3.5 - 5.0 g/dL   AST 48 (H) 15 - 41 U/L   ALT 27 17 - 63 U/L   Alkaline Phosphatase 111 38 - 126 U/L   Total Bilirubin 2.2 (H) 0.3 - 1.2 mg/dL   GFR calc non Af Amer >60 >60 mL/min   GFR calc Af Amer >60 >60 mL/min    Comment: (NOTE) The eGFR has been calculated using the CKD EPI equation. This calculation has not been validated in all clinical situations. eGFR's persistently <60 mL/min signify possible Chronic Kidney Disease.    Anion gap 14 5 - 15  I-Stat CG4 Lactic Acid, ED     Status: None   Collection Time: 09/03/15  2:27 PM  Result Value Ref Range   Lactic Acid, Venous 2.00 0.5 - 2.0 mmol/L  I-Stat CG4 Lactic Acid, ED     Status: None   Collection Time: 09/03/15  7:53 PM  Result Value Ref Range   Lactic Acid, Venous 1.97 0.5 - 2.0 mmol/L  Protime-INR     Status: Abnormal   Collection Time: 09/03/15 10:58 PM  Result Value Ref Range   Prothrombin Time 30.9 (H) 11.6 - 15.2 seconds   INR 3.16 (H) 0.00 - 1.49  Type and screen [place]     Status: None   Collection Time: 09/03/15 10:58 PM  Result Value Ref Range   ABO/RH(D) AB POS    Antibody Screen NEG    Sample Expiration 09/06/2015   ABO/Rh     Status: None   Collection Time: 09/03/15 10:58 PM  Result Value Ref Range   ABO/RH(D) AB POS   CBC     Status: Abnormal   Collection Time: 09/04/15  4:56 AM  Result Value Ref Range   WBC 7.2 4.0 - 10.5 K/uL   RBC 4.15 (L) 4.22 - 5.81 MIL/uL   Hemoglobin 13.6 13.0 - 17.0 g/dL   HCT 39.5 39.0 - 52.0 %   MCV 95.2 78.0 - 100.0 fL   MCH 32.8 26.0 - 34.0 pg   MCHC 34.4 30.0 - 36.0 g/dL   RDW 14.1 11.5 - 15.5 %   Platelets 189 150 - 400 K/uL  Basic metabolic panel     Status: Abnormal   Collection Time: 09/04/15   4:56 AM  Result Value Ref Range   Sodium 139 135 - 145 mmol/L   Potassium 3.0 (L) 3.5 - 5.1 mmol/L   Chloride 103 101 - 111 mmol/L   CO2 25 22 - 32 mmol/L   Glucose, Bld 92 65 - 99 mg/dL   BUN 16 6 - 20 mg/dL   Creatinine, Ser 0.61 0.61 - 1.24 mg/dL   Calcium 8.4 (L) 8.9 - 10.3 mg/dL   GFR calc non Af Amer >60 >60 mL/min   GFR calc Af Amer >60 >60 mL/min    Comment: (NOTE) The eGFR has been calculated using the CKD EPI equation. This calculation has not been validated in all clinical situations. eGFR's persistently <60 mL/min signify possible Chronic Kidney Disease.    Anion gap 11 5 - 15  Protime-INR     Status: Abnormal   Collection Time: 09/04/15  4:56 AM  Result Value Ref Range   Prothrombin Time 32.3 (H) 11.6 - 15.2 seconds   INR 3.35 (H) 0.00 - 1.49    Dg Abd 1 View  09/04/2015  CLINICAL DATA:  Sigmoidoscopy for sigmoid volvulus. EXAM:  ABDOMEN - 1 VIEW COMPARISON:  09/03/2015 CT FINDINGS: Pigtail catheter has been placed in the rectum. Distal colonic distention has improved. Proximal colonic distention remains prominent. No supine evidence of pneumoperitoneum. Nasogastric tube with tip at the distal stomach. IMPRESSION: After endoscopic decompression distal colonic distention is improved. Proximal colonic distention remains prominent. Electronically Signed   By: Monte Fantasia M.D.   On: 09/04/2015 03:38   Dg Abd 1 View  09/03/2015  CLINICAL DATA:  Evaluate NG tube placement EXAM: ABDOMEN - 1 VIEW COMPARISON:  KUB from earlier today FINDINGS: The distal tip of the NG tube is just below the GE junction with side port above the GE junction. Recommend advancement. Diffuse dilated loops of large and small bowel again identified. IMPRESSION: The side port of the NG tube is above the GE junction. The patient may benefit from advancing the NG tube several cm. Continued bowel distention. Electronically Signed   By: Dorise Bullion III M.D   On: 09/03/2015 16:59   Ct Abdomen Pelvis W  Contrast  09/03/2015  CLINICAL DATA:  Generalized abdominal pain EXAM: CT ABDOMEN AND PELVIS WITH CONTRAST TECHNIQUE: Multidetector CT imaging of the abdomen and pelvis was performed using the standard protocol following bolus administration of intravenous contrast. CONTRAST:  100 mL Isovue-370 COMPARISON:  Plain film from earlier in the same day FINDINGS: Lung bases demonstrate some minimal right basilar atelectatic changes. No focal infiltrate or sizable effusion is noted. The liver, gallbladder, spleen, adrenal glands and pancreas are within normal limits. Kidneys are well visualized bilaterally with a normal enhancement pattern. Delayed images demonstrate normal excretion of contrast and small para pelvic cysts bilaterally. A nasogastric catheter is noted coiled within the stomach. There is significant distension of the colon identified with an abrupt caliber change in the rectosigmoid with swirling vasculature surrounding the area of narrowing consistent with sigmoid volvulus. Significant redundancy of the sigmoid is noted. Small bowel as visualized is within normal limits. No pneumatosis or free air is identified at this time. The appendix is not visualize consistent with a prior surgical history. The bladder is well distended. No pelvic mass lesion or sidewall abnormality is noted. No acute bony abnormality is noted. Postsurgical changes in the lumbar spine are seen IMPRESSION: Changes consistent with sigmoid volvulus with significant colonic distention. No free air or pneumatosis is noted at this time. Mild bibasilar atelectasis. No other acute abnormality is noted. Critical Value/emergent results were called by telephone at the time of interpretation on 09/03/2015 at 7:13 pm to Dr. Johnney Killian , who verbally acknowledged these results. Electronically Signed   By: Inez Catalina M.D.   On: 09/03/2015 19:15   Dg Abd Acute W/chest  09/03/2015  CLINICAL DATA:  Constipation and increasing abdominal distention. EXAM:  DG ABDOMEN ACUTE W/ 1V CHEST COMPARISON:  Chest x-ray 09/13/2014. FINDINGS: The heart is mildly enlarged but stable. Stable tortuosity and calcification of the thoracic aorta. Bibasilar scarring changes and eventration of the right hemidiaphragm. Stable scarring changes at the left lung base. The bony thorax is intact. They spinal cord stimulator is noted. Dilated small bowel and colon consistent with a diffuse ileus. No findings for small bowel obstruction or free air. Lower lumbar fusion hardware noted. IMPRESSION: Diffusely distended, gas-filled small bowel and colon consistent with a diffuse ileus. No definite free air. No acute cardiopulmonary findings. Electronically Signed   By: Marijo Sanes M.D.   On: 09/03/2015 15:03    Review of Systems  Constitutional: Negative for fever and chills.  Gastrointestinal: Positive for vomiting, abdominal pain and constipation.   Blood pressure 167/68, pulse 62, temperature 97.9 F (36.6 C), temperature source Oral, resp. rate 16, height _0  (1.803 m), weight 78.7 kg (173 lb 8 oz), SpO2 96 %. Physical Exam  Constitutional:  Elderly male in no acute distress.  HENT:  Nasogastric tube and draining bilious fluid.  Eyes: No scleral icterus.  GI: Soft. He exhibits distension (mild). There is no tenderness.  Under midline scar.  Small palpable fascial defect just to the left and superior to the umbilicus.  Neurological: He is alert.  Skin: Skin is warm and dry.    Assessment/Plan: 1. Acute sigmoid volvulus leading to bowel obstruction. Volvulus has been reduced. He is feeling much better.  2 Multiple medical co-morbidities and chronic anticoagulation.  Plan: Check abdominal x-ray this morning.  We discussed the risk for recurrence which can be up to 60%. We talked about elective partial colectomy while he is here in the hospital.  His INR was corrected and he would need a preoperative cardiac evaluation. I  explained the procedure and risks of colon  resection.  Risks include but are not limited to bleeding, infection, wound problems, anesthesia, anastomotic leak, need for colostomy, need for reoperative surgery,  injury to intraabominal organs.  He stated he would like to avoid any major surgery was all possible.  He is aware that the recurrence rate is not trivial. We will follow him here while he is in the hospital. Stefanos Haynesworth J 09/04/2015, 9:09 AM

## 2015-09-04 NOTE — Progress Notes (Addendum)
     [facility] Progress Note  Assessment / Plan: *82 y/o male with progressive distension and obstipation over the past several days, presented with worsening symptoms this evening.  CT scan showed sigmoid volvulus. Had endoscopic reduction last night.  Surgery consulting given high risk of recurrence of volvulus without definitive therapy.  X-ray this AM showing continued gaseous distention.    Khai Arrona D.  09/04/2015, 9:29 AM  Pager number [facility] Attending   I have taken an interval history, reviewed the chart and examined the patient. I agree with the Advanced Practitioner's note, impression and recommendations.    He feels much better and says his abdomen is at baseline or better than that. Xray stable gaseous distention similar to post colon decompression Tube is out I will clamp NG Clears K Dur to Tx hypokalemia Miralax Shower  If worsening first step may be to insert red rubber rectal tube  He is considering surgery.  Gatha Mayer, MD, [facility] [phone] (pager) [phone] after 5 PM, weekends and holidays  09/04/2015 11:44 AM   Subjective:  feFls better, less distended - has had some stool, no flatus.  Objective:  Vital signs in last 24 hours: Temp:  [97.9 F (36.6 C)-98.1 F (36.7 C)] 97.9 F (36.6 C) (06/06 0349) Pulse Rate:  [54-77] 62 (06/06 0349) Resp:  [14-23] 16 (06/06 0349) BP: (123-184)/(41-96) 167/68 mmHg (06/06 0349) SpO2:  [94 %-96 %] 96 % (06/06 0349) Weight:  [173 lb 8 oz (78.7 kg)] 173 lb 8 oz (78.7 kg) (06/06 0349)   General:  Alert, Well-developed, in NAD Heart:  Regular rate and rhythm; no murmurs Pulm:  CTAB.  No W/R/R. Abdomen:  Soft, minimally distended.  BS present but quiet at the moment.  Non-tender. Extremities:  Without edema. Neurologic:  Alert and oriented x 4;  grossly normal neurologically. Psych:  Alert and cooperative. Normal mood and  affect.  Intake/Output from previous day: 06/05 0701 - 06/06 0700 In: 198.8 [I.V.:148.8; IV Piggyback:50] Out: 225 [Urine:225]  Lab Results:  Recent Labs  09/03/15 1415 09/04/15 0456  WBC 8.9 7.2  HGB 15.0 13.6  HCT 42.6 39.5  PLT 203 189   BMET  Recent Labs  09/03/15 1415 09/04/15 0456  NA 138 139  K 3.3* 3.0*  CL 102 103  CO2 22 25  GLUCOSE 107* 92  BUN 23* 16  CREATININE 0.99 0.61  CALCIUM 9.4 8.4*   LFT  Recent Labs  09/03/15 1415  PROT 8.2*  ALBUMIN 4.9  AST 48*  ALT 27  ALKPHOS 111  BILITOT 2.2*   PT/INR  Recent Labs  09/03/15 2258 09/04/15 0456  LABPROT 30.9* 32.3*  INR 3.16* 3.35*

## 2015-09-04 NOTE — ED Provider Notes (Signed)
EKG Interpretation  Date/Time:  Tuesday September 04 2015 01:47:10 EDT Ventricular Rate:  72 PR Interval:    QRS Duration: 149 QT Interval:  498 QTC Calculation: 545 R Axis:   -58 Text Interpretation:  Atrial fibrillation RBBB and LAFB Rate is slower Confirmed by Florina Ou  MD, Jenny Reichmann ([id number]) on 09/04/2015 1:51:43 AM        Shanon Rosser, MD 09/04/15 [phone]

## 2015-09-04 NOTE — ED Notes (Signed)
Bed: [id number] Expected date:  Expected time:  Means of arrival:  Comments: Hold for pt-endo

## 2015-09-04 NOTE — ED Notes (Signed)
Patient transported to X-ray 

## 2015-09-04 NOTE — ED Provider Notes (Signed)
Patient initially seen by Dr. Deno Etienne, signed out to Dr. Charlesetta Shanks, with diagnosis of sigmoid volvulus. He was taken to endo lab where volvulus was decompressed by Dr. Havery Moros who advised rectal tube left in place. He recommended overnight observation with formal surgical consultation in the morning as there is a high rate of recurrence of volvulus.   Discussed with Dr. Lily Kocher, [facility], who accepts the patient for admission.   Charlann Lange, PA-C 09/04/15 0123  Charlesetta Shanks, MD 09/16/15 2350

## 2015-09-04 NOTE — H&P (Signed)
History and Physical    Ian Sullivan. [id number] DOB: Apr 08, 1927 DOA: 09/03/2015  PCP: [name], MD  [facility] [place]  Patient coming from: Home  Chief Complaint: Abdominal pain and distention  HPI: Ian Sullivan. is a 82 y.o. gentleman with a history of HTN and atrial fibrillation (anticoagulated with warfarin) who presented to the ED for evaluation of a one week history of progressive abdominal distention associated with pain and constipation.  No significant nausea or vomiting.  No chest pain, shortness of breath, or syncope. No fever.  ED Course: Abdominal imaging showed a sigmoid volvulus.  NG tube was placed to low continuous suction and GI was consulted as recommended by General Surgery.  The patient was taken to the endoscopy lab for acute decompression.  A rectal tube was left in place.  Hospitalist asked to admit, and general surgery will need to be formally consulted again in the AM for definitive management, per GI.  Review of Systems: Limited ROS negative except as stated in the HPI.  Past Medical History  Diagnosis Date  . Unspecified essential hypertension   . Anxiety state, unspecified     past at death of spouse  . Depressive disorder, not elsewhere classified     past at death of spouse  . Other and unspecified hyperlipidemia   . Dyskinesia of esophagus   . Long term (current) use of anticoagulants   . Hiatal hernia   . Duodenitis without mention of hemorrhage   . Unspecified gastritis and gastroduodenitis without mention of hemorrhage   . Unspecified hemorrhoids without mention of complication   . Diverticulosis of colon (without mention of hemorrhage)   . Barrett's esophagus   . Irritable bowel syndrome   . Unspecified sleep apnea   . Anemia   . Atrial fibrillation ([place])   . Skin cancer (melanoma) ([place])   . Status post dilation of esophageal narrowing   . GERD (gastroesophageal reflux disease)   . Hereditary and idiopathic  peripheral neuropathy 10/23/2014    Past Surgical History  Procedure Laterality Date  . Nissen fundoplication  [id number]  . Turp vaporization  1980s  . Carpal tunnel release Bilateral 2003  . Appendectomy  1945  . Tonsillectomy    . Vasectomy    . Lumbar laminectomy    . Small bowel obsturction      x4  . Cardiac catheterization  1991  . Central decompressive laminectomy      L2-L5  . Esophagogastroduodenoscopy    . Colonoscopy    . Spinal cord stimulator implant  06/2011    Dr. Anselm Lis  . Eus  11/13/2011    Procedure: LOWER ENDOSCOPIC ULTRASOUND (EUS);  Surgeon: Milus Banister, MD;  Location: [facility];  Service: Endoscopy;  Laterality: N/A;     reports that he quit smoking about 43 years ago. He has never used smokeless tobacco. He reports that he drinks about 0.6 oz of alcohol per week. He reports that he does not use illicit drugs.  Allergies  Allergen Reactions  . Horse-Derived Products     Family History  Problem Relation Age of Onset  . Stroke Mother   . Coronary artery disease Father   . Colon cancer Neg Hx   . Heart disease Other     [facility]    Prior to Admission medications   Medication Sig Start Date End Date Taking? Authorizing Provider  amLODipine (NORVASC) 10 MG tablet Take 1 tablet (10 mg total) by mouth daily. 01/23/15  Yes Lelon Perla, MD  Calcium 600 MG tablet Take 1,200 mg by mouth daily. Every other day   Yes Historical Provider, MD  cholecalciferol (VITAMIN D) 1000 UNITS tablet Take 1,000 Units by mouth daily.     Yes Historical Provider, MD  Ferrous Sulfate (IRON) 325 (65 FE) MG TABS Take 1 tablet by mouth daily.     Yes Historical Provider, MD  fish oil-omega-3 fatty acids 1000 MG capsule Take 2 capsules by mouth 2 (two) times daily.    Yes Historical Provider, MD  FLUoxetine (PROZAC) 20 MG tablet Take 20 mg by mouth daily.   Yes Historical Provider, MD  furosemide (LASIX) 20 MG tablet Take 1 tablet (20 mg total) by mouth daily. 11/16/14  Yes Lelon Perla, MD  losartan (COZAAR) 100 MG tablet Take 1 tablet (100 mg total) by mouth daily. 11/16/14  Yes Lelon Perla, MD  Magnesium 100 MG CAPS Take 100 mg by mouth daily.    Yes Historical Provider, MD  Multiple Vitamin (MULTIVITAMIN) tablet Take 1 tablet by mouth daily.   Yes Historical Provider, MD  omeprazole (PRILOSEC) 20 MG capsule Take 20 mg by mouth daily.     Yes Historical Provider, MD  potassium chloride (KLOR-CON M10) 10 MEQ tablet TAKE 1 TABLET BY MOUTH EVERY DAY Patient taking differently: Take 10 mEq by mouth daily.  05/01/15  Yes Lelon Perla, MD  pravastatin (PRAVACHOL) 10 MG tablet Take 1 tablet (10 mg total) by mouth daily. 12/28/14  Yes Lelon Perla, MD  vitamin E 400 UNIT capsule Take 400 Units by mouth daily.     Yes Historical Provider, MD  warfarin (COUMADIN) 5 MG tablet TAKE 1 TO 1 & 1/2 TABLETS BY MOUTH DAILY AS DIRECTED BY COUMADIN CLINIC Patient taking differently: TAKE 5 MG BY MOUTH DAILY EXCEPT TAKE 7.5 MG ON M,W,FRI 06/27/15  Yes Lelon Perla, MD    Physical Exam: Filed Vitals:   09/04/15 0035 09/04/15 0040 09/04/15 0050 09/04/15 0235  BP: 131/70 142/73 148/80 162/87  Pulse: 69 63 71 64  Temp:      TempSrc:      Resp: 18 16 18 14   SpO2: 96% 96% 95% 95%      Constitutional: NAD, calm Filed Vitals:   09/04/15 0035 09/04/15 0040 09/04/15 0050 09/04/15 0235  BP: 131/70 142/73 148/80 162/87  Pulse: 69 63 71 64  Temp:      TempSrc:      Resp: 18 16 18 14   SpO2: 96% 96% 95% 95%   Eyes: PERRL, lids and conjunctivae normal ENMT: Mucous membranes are moist. Normal dentition. NG tube in place. Neck: normal, supple Respiratory: clear to auscultation listening anteriorly.  Normal respiratory effort. No accessory muscle use.  Cardiovascular: Irregular but rate controlled.  No murmurs / rubs / gallops. No extremity edema. 2+ pedal pulses. Abdomen: Soft and compressible. No tenderness.  No guarding.  Bowel sounds present.  Musculoskeletal: no  clubbing / cyanosis. No joint deformity upper and lower extremities. Good ROM, no contractures. Normal muscle tone.  Skin: no rashes, warm and dry Neurologic: CN 2-12 grossly intact.  Psychiatric: Normal judgment and insight. Alert and oriented x 3. Normal mood.     Labs on Admission: I have personally reviewed following labs and imaging studies  CBC:  Recent Labs Lab 09/03/15 1415  WBC 8.9  NEUTROABS 6.8  HGB 15.0  HCT 42.6  MCV 92.4  PLT 123456   Basic Metabolic Panel:  Recent  Labs Lab 09/03/15 1415  NA 138  K 3.3*  CL 102  CO2 22  GLUCOSE 107*  BUN 23*  CREATININE 0.99  CALCIUM 9.4   GFR: CrCl cannot be calculated (Unknown ideal weight.). Liver Function Tests:  Recent Labs Lab 09/03/15 1415  AST 48*  ALT 27  ALKPHOS 111  BILITOT 2.2*  PROT 8.2*  ALBUMIN 4.9   Coagulation Profile:  Recent Labs Lab 09/03/15 2258  INR 3.16*    Radiological Exams on Admission: Dg Abd 1 View  09/03/2015  CLINICAL DATA:  Evaluate NG tube placement EXAM: ABDOMEN - 1 VIEW COMPARISON:  KUB from earlier today FINDINGS: The distal tip of the NG tube is just below the GE junction with side port above the GE junction. Recommend advancement. Diffuse dilated loops of large and small bowel again identified. IMPRESSION: The side port of the NG tube is above the GE junction. The patient may benefit from advancing the NG tube several cm. Continued bowel distention. Electronically Signed   By: Dorise Bullion III M.D   On: 09/03/2015 16:59   Ct Abdomen Pelvis W Contrast  09/03/2015  CLINICAL DATA:  Generalized abdominal pain EXAM: CT ABDOMEN AND PELVIS WITH CONTRAST TECHNIQUE: Multidetector CT imaging of the abdomen and pelvis was performed using the standard protocol following bolus administration of intravenous contrast. CONTRAST:  100 mL Isovue-370 COMPARISON:  Plain film from earlier in the same day FINDINGS: Lung bases demonstrate some minimal right basilar atelectatic changes. No focal  infiltrate or sizable effusion is noted. The liver, gallbladder, spleen, adrenal glands and pancreas are within normal limits. Kidneys are well visualized bilaterally with a normal enhancement pattern. Delayed images demonstrate normal excretion of contrast and small para pelvic cysts bilaterally. A nasogastric catheter is noted coiled within the stomach. There is significant distension of the colon identified with an abrupt caliber change in the rectosigmoid with swirling vasculature surrounding the area of narrowing consistent with sigmoid volvulus. Significant redundancy of the sigmoid is noted. Small bowel as visualized is within normal limits. No pneumatosis or free air is identified at this time. The appendix is not visualize consistent with a prior surgical history. The bladder is well distended. No pelvic mass lesion or sidewall abnormality is noted. No acute bony abnormality is noted. Postsurgical changes in the lumbar spine are seen IMPRESSION: Changes consistent with sigmoid volvulus with significant colonic distention. No free air or pneumatosis is noted at this time. Mild bibasilar atelectasis. No other acute abnormality is noted. Critical Value/emergent results were called by telephone at the time of interpretation on 09/03/2015 at 7:13 pm to Dr. Johnney Killian , who verbally acknowledged these results. Electronically Signed   By: Inez Catalina M.D.   On: 09/03/2015 19:15   Dg Abd Acute W/chest  09/03/2015  CLINICAL DATA:  Constipation and increasing abdominal distention. EXAM: DG ABDOMEN ACUTE W/ 1V CHEST COMPARISON:  Chest x-ray 09/13/2014. FINDINGS: The heart is mildly enlarged but stable. Stable tortuosity and calcification of the thoracic aorta. Bibasilar scarring changes and eventration of the right hemidiaphragm. Stable scarring changes at the left lung base. The bony thorax is intact. They spinal cord stimulator is noted. Dilated small bowel and colon consistent with a diffuse ileus. No findings for  small bowel obstruction or free air. Lower lumbar fusion hardware noted. IMPRESSION: Diffusely distended, gas-filled small bowel and colon consistent with a diffuse ileus. No definite free air. No acute cardiopulmonary findings. Electronically Signed   By: Marijo Sanes M.D.   On: 09/03/2015  15:03    EKG: Independently reviewed. Atrial fibrillation, rate controlled.  Assessment/Plan Principal Problem:   Sigmoid volvulus (HCC) Active Problems:   Essential hypertension   ATRIAL FIBRILLATION   Sigmoid volvulus --Management per GI/General Surgery --Maintain NPO status for now  Chronic atrial fibrillation anticoagulated with coumadin --Hold warfarin for now in case he has to go to surgery --Daily INR  HTN --Monitor.  Holding oral medications while NPO.  IV hydralazine prn  Hypokalemia --NS with 69mEq KCl at 75cc/hr for 10 hours.  Repeat BMP in the AM.   DVT prophylaxis: Anticoagulated with warfarin Code Status: FULL Family Communication: patient's son at bedside Disposition Plan: Home when ready Consults called: [facility] has seen and evaluated in the ED.  Will need to call surgery again in the morning. Admission status: Observation, telemetry    Eber Jones MD [facility]   If 7PM-7AM, please contact night-coverage [email or web address] Password [email or web address]  09/04/2015, 3:40 AM

## 2015-09-04 NOTE — Progress Notes (Addendum)
[facility]  82 y/o male with a-fib, HTN who lives alone presented with abdominal pain and distension for 2-3 days. Found to have sigmoid volvulus.  Principal Problem:   Sigmoid volvulus ([place]) - decompressed last night by GI- now has NG- xray showing continued obstruction - Gen surgery would like to operate but patient is hesitant - cont NG tube and serial xrays - have given 1 mg of Vit K IV- he stopped his Coumadin about 4 days ago therefore level should begin to drop - if he decides on surgery, will need cardiac clearance per gen surgery and Miralax prep - slow IVF as he has a h/o heart failure  Active Problems:   Essential hypertension - Hold Norvasc, Losartan, Lasix and follow  Hypokalemia - GI has ordered oral KCL - replace and follow- mg+ normal    ATRIAL FIBRILLATION - rate is controlled but his is not on rate controlling meds- hold Coumadin - last ECHO showed elevated LV ED pressure with dilated b/l atria and mod TR  Diastolic dysfunction - hold Lasix while NPO- slow IVF- follow I and O carefully   Debbe Odea, MD

## 2015-09-05 DIAGNOSIS — I482 Chronic atrial fibrillation: Secondary | ICD-10-CM

## 2015-09-05 DIAGNOSIS — Z01818 Encounter for other preprocedural examination: Secondary | ICD-10-CM

## 2015-09-05 DIAGNOSIS — I10 Essential (primary) hypertension: Secondary | ICD-10-CM

## 2015-09-05 DIAGNOSIS — E876 Hypokalemia: Secondary | ICD-10-CM

## 2015-09-05 LAB — BASIC METABOLIC PANEL
ANION GAP: 8 (ref 5–15)
BUN: 11 mg/dL (ref 6–20)
CO2: 28 mmol/L (ref 22–32)
Calcium: 8.6 mg/dL — ABNORMAL LOW (ref 8.9–10.3)
Chloride: 104 mmol/L (ref 101–111)
Creatinine, Ser: 0.67 mg/dL (ref 0.61–1.24)
GFR calc Af Amer: >60 mL/min (ref >60)
GFR calc non Af Amer: >60 mL/min (ref >60)
GLUCOSE: 113 mg/dL — AB (ref 65–99)
POTASSIUM: 3.1 mmol/L — AB (ref 3.5–5.1)
Sodium: 140 mmol/L (ref 135–145)

## 2015-09-05 LAB — PROTIME-INR
INR: 1.55 — AB (ref 0.00–1.49)
PROTHROMBIN TIME: 18.6 s — AB (ref 11.6–15.2)

## 2015-09-05 MED ORDER — LOSARTAN POTASSIUM 50 MG PO TABS
100.0000 mg | ORAL_TABLET | Freq: Every day | ORAL | Status: DC
  Administered 2015-09-05 – 2015-09-06 (×2): 100 mg via ORAL
  Filled 2015-09-05 (×2): qty 2

## 2015-09-05 MED ORDER — POTASSIUM CHLORIDE CRYS ER 20 MEQ PO TBCR
40.0000 meq | EXTENDED_RELEASE_TABLET | Freq: Three times a day (TID) | ORAL | Status: AC
Start: 2015-09-05 — End: 2015-09-05
  Administered 2015-09-05 (×3): 40 meq via ORAL
  Filled 2015-09-05 (×3): qty 2

## 2015-09-05 MED ORDER — POTASSIUM CHLORIDE CRYS ER 20 MEQ PO TBCR: 40.0000 meq | EXTENDED_RELEASE_TABLET | ORAL | Status: DC

## 2015-09-05 MED ORDER — POTASSIUM CHLORIDE IN NACL 20-0.9 MEQ/L-% IV SOLN
INTRAVENOUS | Status: DC
  Administered 2015-09-05 – 2015-09-07 (×4): via INTRAVENOUS
  Filled 2015-09-05 (×4): qty 1000

## 2015-09-05 NOTE — Progress Notes (Signed)
[facility] Progress Note  1 Day Post-Op  Subjective: Pt doing well, no pain, feels much less bloated.  No N/V.  He's had several loose BM's since the flex sig.  He's thought a lot about surgery and he thinks he would like to proceed.  His NG "fell out" while he was getting cleaned up this am.    Objective: Vital signs in last 24 hours: Temp:  [98.6 F (37 C)-99.8 F (37.7 C)] 99.8 F (37.7 C) (06/07 0445) Pulse Rate:  [52-74] 74 (06/07 0445) Resp:  [18] 18 (06/07 0445) BP: (140-177)/(54-78) 149/74 mmHg (06/07 0445) SpO2:  [97 %-98 %] 97 % (06/07 0445) Last BM Date: 09/04/15  Intake/Output from previous day: 06/06 0701 - 06/07 0700 In: 650 [P.O.:600; IV Piggyback:50] Out: 1250 [Urine:1200; Emesis/NG output:50] Intake/Output this shift: Total I/O In: 240 [P.O.:240] Out: 150 [Urine:150]  PE: Gen:  Alert, NAD, pleasant Card:  RRR, no M/G/R heard Pulm:  CTA, no W/R/R, good effort Abd: Soft, some distension, NT, +BS, no HSM Ext:  No erythema, edema, or tenderness  Lab Results:   Recent Labs  09/03/15 1415 09/04/15 0456  WBC 8.9 7.2  HGB 15.0 13.6  HCT 42.6 39.5  PLT 203 189   BMET  Recent Labs  09/04/15 0456 09/05/15 0459  NA 139 140  K 3.0* 3.1*  CL 103 104  CO2 25 28  GLUCOSE 92 113*  BUN 16 11  CREATININE 0.61 0.67  CALCIUM 8.4* 8.6*   PT/INR  Recent Labs  09/04/15 0456 09/05/15 0459  LABPROT 32.3* 18.6*  INR 3.35* 1.55*   CMP     Component Value Date/Time   NA 140 09/05/2015 0459   K 3.1* 09/05/2015 0459   CL 104 09/05/2015 0459   CO2 28 09/05/2015 0459   GLUCOSE 113* 09/05/2015 0459   BUN 11 09/05/2015 0459   CREATININE 0.67 09/05/2015 0459   CREATININE 0.83 04/17/2015 1535   CALCIUM 8.6* 09/05/2015 0459   PROT 8.2* 09/03/2015 1415   ALBUMIN 4.9 09/03/2015 1415   AST 48* 09/03/2015 1415   ALT 27 09/03/2015 1415   ALKPHOS 111 09/03/2015 1415   BILITOT 2.2* 09/03/2015 1415   GFRNONAA >60 09/05/2015 0459   GFRAA >60  09/05/2015 0459   Lipase  No results found for: LIPASE     Studies/Results: Dg Abd 1 View  09/04/2015  CLINICAL DATA:  Sigmoidoscopy for sigmoid volvulus. EXAM: ABDOMEN - 1 VIEW COMPARISON:  09/03/2015 CT FINDINGS: Pigtail catheter has been placed in the rectum. Distal colonic distention has improved. Proximal colonic distention remains prominent. No supine evidence of pneumoperitoneum. Nasogastric tube with tip at the distal stomach. IMPRESSION: After endoscopic decompression distal colonic distention is improved. Proximal colonic distention remains prominent. Electronically Signed   By: Monte Fantasia M.D.   On: 09/04/2015 03:38   Dg Abd 1 View  09/03/2015  CLINICAL DATA:  Evaluate NG tube placement EXAM: ABDOMEN - 1 VIEW COMPARISON:  KUB from earlier today FINDINGS: The distal tip of the NG tube is just below the GE junction with side port above the GE junction. Recommend advancement. Diffuse dilated loops of large and small bowel again identified. IMPRESSION: The side port of the NG tube is above the GE junction. The patient may benefit from advancing the NG tube several cm. Continued bowel distention. Electronically Signed   By: Dorise Bullion III M.D   On: 09/03/2015 16:59   Ct Abdomen Pelvis W Contrast  09/03/2015  CLINICAL DATA:  Generalized  abdominal pain EXAM: CT ABDOMEN AND PELVIS WITH CONTRAST TECHNIQUE: Multidetector CT imaging of the abdomen and pelvis was performed using the standard protocol following bolus administration of intravenous contrast. CONTRAST:  100 mL Isovue-370 COMPARISON:  Plain film from earlier in the same day FINDINGS: Lung bases demonstrate some minimal right basilar atelectatic changes. No focal infiltrate or sizable effusion is noted. The liver, gallbladder, spleen, adrenal glands and pancreas are within normal limits. Kidneys are well visualized bilaterally with a normal enhancement pattern. Delayed images demonstrate normal excretion of contrast and small para  pelvic cysts bilaterally. A nasogastric catheter is noted coiled within the stomach. There is significant distension of the colon identified with an abrupt caliber change in the rectosigmoid with swirling vasculature surrounding the area of narrowing consistent with sigmoid volvulus. Significant redundancy of the sigmoid is noted. Small bowel as visualized is within normal limits. No pneumatosis or free air is identified at this time. The appendix is not visualize consistent with a prior surgical history. The bladder is well distended. No pelvic mass lesion or sidewall abnormality is noted. No acute bony abnormality is noted. Postsurgical changes in the lumbar spine are seen IMPRESSION: Changes consistent with sigmoid volvulus with significant colonic distention. No free air or pneumatosis is noted at this time. Mild bibasilar atelectasis. No other acute abnormality is noted. Critical Value/emergent results were called by telephone at the time of interpretation on 09/03/2015 at 7:13 pm to Dr. Johnney Killian , who verbally acknowledged these results. Electronically Signed   By: Inez Catalina M.D.   On: 09/03/2015 19:15   Dg Abd Acute W/chest  09/03/2015  CLINICAL DATA:  Constipation and increasing abdominal distention. EXAM: DG ABDOMEN ACUTE W/ 1V CHEST COMPARISON:  Chest x-ray 09/13/2014. FINDINGS: The heart is mildly enlarged but stable. Stable tortuosity and calcification of the thoracic aorta. Bibasilar scarring changes and eventration of the right hemidiaphragm. Stable scarring changes at the left lung base. The bony thorax is intact. They spinal cord stimulator is noted. Dilated small bowel and colon consistent with a diffuse ileus. No findings for small bowel obstruction or free air. Lower lumbar fusion hardware noted. IMPRESSION: Diffusely distended, gas-filled small bowel and colon consistent with a diffuse ileus. No definite free air. No acute cardiopulmonary findings. Electronically Signed   By: Marijo Sanes  M.D.   On: 09/03/2015 15:03   Dg Abd Portable 1v  09/04/2015  CLINICAL DATA:  Abdominal distension with sigmoid volvulus EXAM: PORTABLE ABDOMEN - 1 VIEW COMPARISON:  09/04/2015 at 2:59 a.m. FINDINGS: NG tube again projects over the stomach. No change in the position of rectal catheter which projects with tip in the midline of the inferior pelvis. Diffuse gaseous distention of small and large bowel again identified including large bowel distention in the midline of the mid to upper abdomen to a diameter of 13.5 cm. IMPRESSION: No change in appearance when compared to prior study with significant persistent gaseous distensionof the large bowel. Electronically Signed   By: Skipper Cliche M.D.   On: 09/04/2015 09:48    Anti-infectives: Anti-infectives    None       Assessment/Plan Acute sigmoid volvulus leading to bowel obstruction.  -Volvulus has been reduced - flex sig decompression. He is feeling much better.  -Reoccurance rate can be 60%.  Discussion of elective partial colectomy this hospitalization.  If he is agreeable would need pre-op clearance.  INR being corrected and would be ready for surgery as early as tomorrow.   -NG tube has fallen out,  can keep out for now -Patient is now agreeable to surgery, we discussed the risks/benefits of surgery and went over his extensive list of questions including recovery time, exact procedure, likelihood of colostomy, post-op course, diet progression, NG tube, etc. -Gentle colon prep - miralax BID, if Bm's are clear tomorrow then can proceed, otherwise may need until Friday Hypokalemia-Continue IVF with K and K supplements Multiple medical co-morbidities and chronic anticoagulation. -INR 1.55 today    LOS: 1 day    Nat Christen 09/05/2015, 9:57 AM Pager: [phone]  (7am - 4:30pm M-F; 7am - 11:30am Sa/Su)

## 2015-09-05 NOTE — Progress Notes (Signed)
Pt's bed alarm was going off. This RN entered room to find pt getting out of bed. Tele was off, and pt had removed NG tube. Pt stated he was incontinent and trying to get himself cleaned up and his NG tube came out. Will continue to monitor pt closely. Ian Sullivan I

## 2015-09-05 NOTE — Care Management Obs Status (Signed)
[place] NOTIFICATION   Patient Details  Name: Ian Sullivan. MRN: [id number] Date of Birth: Apr 28, 1927   Medicare Observation Status Notification Given:  Yes, Pt could not understand why he was Obs. Did not sign.    Purcell Mouton, RN 09/05/2015, 9:11 AM

## 2015-09-05 NOTE — Consult Note (Signed)
Patient ID: Ian Sullivan. MRN: [id number], DOB/AGE: 80-Feb-1929   Admit date: 09/03/2015   Reason for Consult: Surgical Clearance Requesting MD: Dr. Coralyn Pear, Internal Medicine  Primary Physician: [name], MD Primary Cardiologist: Dr. Stanford Breed  Pt. Profile:  82 y/o male with permanent atrial fibrillation that is rate controlled, on chronic oral anticoagulation with coumadin, but no other cardiac history, admited for SBO secondary to sigmoid volvulus. This has been successfully reduced however recurrence rate is high, thus elective partial colectomy has been recommended. His INR has been reversed and cardiology has been consulted for surgical clearance/ cardiac risk assessment.   Problem List  Past Medical History  Diagnosis Date  . Unspecified essential hypertension   . Anxiety state, unspecified     past at death of spouse  . Depressive disorder, not elsewhere classified     past at death of spouse  . Other and unspecified hyperlipidemia   . Dyskinesia of esophagus   . Long term (current) use of anticoagulants   . Hiatal hernia   . Duodenitis without mention of hemorrhage   . Unspecified gastritis and gastroduodenitis without mention of hemorrhage   . Unspecified hemorrhoids without mention of complication   . Diverticulosis of colon (without mention of hemorrhage)   . Barrett's esophagus   . Irritable bowel syndrome   . Unspecified sleep apnea   . Anemia   . Atrial fibrillation ([place])   . Skin cancer (melanoma) ([place])   . Status post dilation of esophageal narrowing   . GERD (gastroesophageal reflux disease)   . Hereditary and idiopathic peripheral neuropathy 10/23/2014    Past Surgical History  Procedure Laterality Date  . Nissen fundoplication  [id number]  . Turp vaporization  1980s  . Carpal tunnel release Bilateral 2003  . Appendectomy  1945  . Tonsillectomy    . Vasectomy    . Lumbar laminectomy    . Small bowel obsturction      x4  . Cardiac  catheterization  1991  . Central decompressive laminectomy      L2-L5  . Esophagogastroduodenoscopy    . Colonoscopy    . Spinal cord stimulator implant  06/2011    Dr. Anselm Lis  . Eus  11/13/2011    Procedure: LOWER ENDOSCOPIC ULTRASOUND (EUS);  Surgeon: Milus Banister, MD;  Location: [facility];  Service: Endoscopy;  Laterality: N/A;     Allergies  Allergies  Allergen Reactions  . Horse-Derived Products     HPI  82 y/o male, followed by Dr. Stanford Breed, with a h/o permanent atrial fibrillation, on chronic anticoagulation with Coumadin. Last echo in August 2016 showed normal LV function, elevated left ventricular filling pressures, mild aortic and mitral regurgitation, severe left atrial enlargement, moderate right atrial enlargement, mild right ventricular enlargement and moderate tricuspid regurgitation. At prior office visit in 2016, patient noted increased dyspnea. Subsequent Holter monitor August 2016 showed A. Fib with a slow ventricular response. His Carvedilol was discontinued and symptoms improved. He is currently not on any rate control agents. There is no documented h/o CAD. He reports he had a LHC at age 50 and had clean coronaries.   Cardiology has been consulted for surgical clearance/ cardiac risk assessment. The patient presented to [facility] with abdominal pain and distention and was diagnosed with a SBO secondary to sigmoid volvulus. His volvulus has been successfully reduced, however the risk for recurrence is high, up to 60%, thus elective partial colectomy has been recommended. His INR has been reversed down to 1.55.  EKG shows atrial fibrillation with RBBB and LBFB. He denies any anginal symptomatolgy. He is not very active, due to chronic low back pain s/p several back surgeries resulting in neuropathy and LEE atrophy. He is however able to walk on the treadmill at home, ~15 min at a time w/o exertional CP or dyspnea.    Home Medications  Prior to Admission medications    Medication Sig Start Date End Date Taking? Authorizing Provider  amLODipine (NORVASC) 10 MG tablet Take 1 tablet (10 mg total) by mouth daily. 01/23/15  Yes Lelon Perla, MD  Calcium 600 MG tablet Take 1,200 mg by mouth daily. Every other day   Yes Historical Provider, MD  cholecalciferol (VITAMIN D) 1000 UNITS tablet Take 1,000 Units by mouth daily.     Yes Historical Provider, MD  Ferrous Sulfate (IRON) 325 (65 FE) MG TABS Take 1 tablet by mouth daily.     Yes Historical Provider, MD  fish oil-omega-3 fatty acids 1000 MG capsule Take 2 capsules by mouth 2 (two) times daily.    Yes Historical Provider, MD  FLUoxetine (PROZAC) 20 MG tablet Take 20 mg by mouth daily.   Yes Historical Provider, MD  furosemide (LASIX) 20 MG tablet Take 1 tablet (20 mg total) by mouth daily. 11/16/14  Yes Lelon Perla, MD  losartan (COZAAR) 100 MG tablet Take 1 tablet (100 mg total) by mouth daily. 11/16/14  Yes Lelon Perla, MD  Magnesium 100 MG CAPS Take 100 mg by mouth daily.    Yes Historical Provider, MD  Multiple Vitamin (MULTIVITAMIN) tablet Take 1 tablet by mouth daily.   Yes Historical Provider, MD  omeprazole (PRILOSEC) 20 MG capsule Take 20 mg by mouth daily.     Yes Historical Provider, MD  potassium chloride (KLOR-CON M10) 10 MEQ tablet TAKE 1 TABLET BY MOUTH EVERY DAY Patient taking differently: Take 10 mEq by mouth daily.  05/01/15  Yes Lelon Perla, MD  pravastatin (PRAVACHOL) 10 MG tablet Take 1 tablet (10 mg total) by mouth daily. 12/28/14  Yes Lelon Perla, MD  vitamin E 400 UNIT capsule Take 400 Units by mouth daily.     Yes Historical Provider, MD  warfarin (COUMADIN) 5 MG tablet TAKE 1 TO 1 & 1/2 TABLETS BY MOUTH DAILY AS DIRECTED BY COUMADIN CLINIC Patient taking differently: TAKE 5 MG BY MOUTH DAILY EXCEPT TAKE 7.5 MG ON M,W,FRI 06/27/15  Yes Lelon Perla, MD    Scheduled Meds: . antiseptic oral rinse  7 mL Mouth Rinse q12n4p  . famotidine (PEPCID) IV  20 mg Intravenous  Q12H  . polyethylene glycol  17 g Oral BID  . potassium chloride  40 mEq Oral TID  . sodium chloride flush  3 mL Intravenous Q12H   Continuous Infusions: . 0.9 % NaCl with KCl 20 mEq / L 75 mL/hr at 09/05/15 1104   PRN Meds:.acetaminophen **OR** acetaminophen, hydrALAZINE, morphine injection, ondansetron **OR** ondansetron (ZOFRAN) IV, phenol   Family History  Family History  Problem Relation Age of Onset  . Stroke Mother   . Coronary artery disease Father   . Colon cancer Neg Hx   . Heart disease Other     [facility]    Social History  Social History   Social History  . Marital Status: Widowed    Spouse Name: N/A  . Number of Children: 1  . Years of Education: N/A   Occupational History  . retired    Social History Main Topics  .  Smoking status: Former Smoker    Quit date: 03/31/1972  . Smokeless tobacco: Never Used  . Alcohol Use: 0.6 oz/week    1 Shots of liquor per week     Comment: ocass  . Drug Use: No  . Sexual Activity: Not on file   Other Topics Concern  . Not on file   Social History Narrative   Alcohol use- yes occasional     Review of Systems General:  No chills, fever, night sweats or weight changes.  Cardiovascular:  No chest pain, dyspnea on exertion, edema, orthopnea, palpitations, paroxysmal nocturnal dyspnea. Dermatological: No rash, lesions/masses Respiratory: No cough, dyspnea Urologic: No hematuria, dysuria Abdominal:   No nausea, vomiting, diarrhea, bright red blood per rectum, melena, or hematemesis Neurologic:  No visual changes, wkns, changes in mental status. All other systems reviewed and are otherwise negative except as noted above.  Physical Exam  Blood pressure 149/74, pulse 74, temperature 99.8 F (37.7 C), temperature source Oral, resp. rate 18, height 5\' 11"  (1.803 m), weight 173 lb 8 oz (78.7 kg), SpO2 97 %.  General: Pleasant, NAD Psych: Normal affect. Neuro: Alert and oriented X 3. Moves all extremities  spontaneously. HEENT: Normal  Neck: Supple without bruits or JVD. Lungs:  Resp regular and unlabored, CTA. Heart: irregularly irregular, regular rate no s3, s4, or murmurs. Abdomen: Soft, non-tender, non-distended, BS + x 4.  Extremities: No clubbing, cyanosis or edema. DP/PT/Radials 2+ and equal bilaterally.  Labs  Troponin (Point of Care Test) No results for input(s): TROPIPOC in the last 72 hours. No results for input(s): CKTOTAL, CKMB, TROPONINI in the last 72 hours. Lab Results  Component Value Date   WBC 7.2 09/04/2015   HGB 13.6 09/04/2015   HCT 39.5 09/04/2015   MCV 95.2 09/04/2015   PLT 189 09/04/2015    Recent Labs Lab 09/03/15 1415  09/05/15 0459  NA 138  < > 140  K 3.3*  < > 3.1*  CL 102  < > 104  CO2 22  < > 28  BUN 23*  < > 11  CREATININE 0.99  < > 0.67  CALCIUM 9.4  < > 8.6*  PROT 8.2*  --   --   BILITOT 2.2*  --   --   ALKPHOS 111  --   --   ALT 27  --   --   AST 48*  --   --   GLUCOSE 107*  < > 113*  < > = values in this interval not displayed. No results found for: CHOL, HDL, LDLCALC, TRIG No results found for: DDIMER   Radiology/Studies  Dg Abd 1 View  09/04/2015  CLINICAL DATA:  Sigmoidoscopy for sigmoid volvulus. EXAM: ABDOMEN - 1 VIEW COMPARISON:  09/03/2015 CT FINDINGS: Pigtail catheter has been placed in the rectum. Distal colonic distention has improved. Proximal colonic distention remains prominent. No supine evidence of pneumoperitoneum. Nasogastric tube with tip at the distal stomach. IMPRESSION: After endoscopic decompression distal colonic distention is improved. Proximal colonic distention remains prominent. Electronically Signed   By: Monte Fantasia M.D.   On: 09/04/2015 03:38   Dg Abd 1 View  09/03/2015  CLINICAL DATA:  Evaluate NG tube placement EXAM: ABDOMEN - 1 VIEW COMPARISON:  KUB from earlier today FINDINGS: The distal tip of the NG tube is just below the GE junction with side port above the GE junction. Recommend advancement.  Diffuse dilated loops of large and small bowel again identified. IMPRESSION: The side port of the  NG tube is above the GE junction. The patient may benefit from advancing the NG tube several cm. Continued bowel distention. Electronically Signed   By: Dorise Bullion III M.D   On: 09/03/2015 16:59   Ct Abdomen Pelvis W Contrast  09/03/2015  CLINICAL DATA:  Generalized abdominal pain EXAM: CT ABDOMEN AND PELVIS WITH CONTRAST TECHNIQUE: Multidetector CT imaging of the abdomen and pelvis was performed using the standard protocol following bolus administration of intravenous contrast. CONTRAST:  100 mL Isovue-370 COMPARISON:  Plain film from earlier in the same day FINDINGS: Lung bases demonstrate some minimal right basilar atelectatic changes. No focal infiltrate or sizable effusion is noted. The liver, gallbladder, spleen, adrenal glands and pancreas are within normal limits. Kidneys are well visualized bilaterally with a normal enhancement pattern. Delayed images demonstrate normal excretion of contrast and small para pelvic cysts bilaterally. A nasogastric catheter is noted coiled within the stomach. There is significant distension of the colon identified with an abrupt caliber change in the rectosigmoid with swirling vasculature surrounding the area of narrowing consistent with sigmoid volvulus. Significant redundancy of the sigmoid is noted. Small bowel as visualized is within normal limits. No pneumatosis or free air is identified at this time. The appendix is not visualize consistent with a prior surgical history. The bladder is well distended. No pelvic mass lesion or sidewall abnormality is noted. No acute bony abnormality is noted. Postsurgical changes in the lumbar spine are seen IMPRESSION: Changes consistent with sigmoid volvulus with significant colonic distention. No free air or pneumatosis is noted at this time. Mild bibasilar atelectasis. No other acute abnormality is noted. Critical Value/emergent  results were called by telephone at the time of interpretation on 09/03/2015 at 7:13 pm to Dr. Johnney Killian , who verbally acknowledged these results. Electronically Signed   By: Inez Catalina M.D.   On: 09/03/2015 19:15   Dg Abd Acute W/chest  09/03/2015  CLINICAL DATA:  Constipation and increasing abdominal distention. EXAM: DG ABDOMEN ACUTE W/ 1V CHEST COMPARISON:  Chest x-ray 09/13/2014. FINDINGS: The heart is mildly enlarged but stable. Stable tortuosity and calcification of the thoracic aorta. Bibasilar scarring changes and eventration of the right hemidiaphragm. Stable scarring changes at the left lung base. The bony thorax is intact. They spinal cord stimulator is noted. Dilated small bowel and colon consistent with a diffuse ileus. No findings for small bowel obstruction or free air. Lower lumbar fusion hardware noted. IMPRESSION: Diffusely distended, gas-filled small bowel and colon consistent with a diffuse ileus. No definite free air. No acute cardiopulmonary findings. Electronically Signed   By: Marijo Sanes M.D.   On: 09/03/2015 15:03   Dg Abd Portable 1v  09/04/2015  CLINICAL DATA:  Abdominal distension with sigmoid volvulus EXAM: PORTABLE ABDOMEN - 1 VIEW COMPARISON:  09/04/2015 at 2:59 a.m. FINDINGS: NG tube again projects over the stomach. No change in the position of rectal catheter which projects with tip in the midline of the inferior pelvis. Diffuse gaseous distention of small and large bowel again identified including large bowel distention in the midline of the mid to upper abdomen to a diameter of 13.5 cm. IMPRESSION: No change in appearance when compared to prior study with significant persistent gaseous distensionof the large bowel. Electronically Signed   By: Skipper Cliche M.D.   On: 09/04/2015 09:48    ECG  EKG shows atrial fibrillation with RBBB and LBFB.    ASSESSMENT AND PLAN  Principal Problem:   Sigmoid volvulus ([place]) Active Problems:   Essential  hypertension   ATRIAL  FIBRILLATION   Chronic constipation    1. Sigmoid Volvulus leading to SBO: volvulus was successfully reduced however recurrence rate is high. Elective partial colectomy recommended.   2. Permanent Atrial Fibrillation: rate is well controlled. He has been on coumadin as an outpatient for a/c. Currently on hold given plans for surgery. He denies any prior h/o stroke or TIA so no need to bridge with heparin. Keep on telemetry given risk for rapid afib during the post operative period. May need to resume rate control agent, for the short term if rate becomes elevated.   3. Preoperative Assessment: patient has no known CAD. Negative LHC at age 74.  No recent CP. He is able to walk on treadmill for ~15 min without exertional CP or dyspnea. EKG w/o ischemia. His only cardiac issues is chronic atrial fibrillation, which is rate control. His last 2D echo in 09/2014 showed normal LVEF of 55-60%, normal wall motion, mild AS and mild MR. No indication for further cardiac testing. He can be cleared for surgery. He would be low risk for noncardiac surgery. As outlined above, will need to monitor HR during postoperative period given h/o atrial fibrillation. MD to assess and will provide further recommendations.    Signed, Lyda Jester, PA-C 09/05/2015, 2:48 PM   Patient seen and examined. Agree with assessment and plan. Mr Caesyn Warne is a very pleasant 82 year old Caucasian male who has a history of permanent atrial fibrillation and has been on chronic warfarin therapy.  He has history of hypertension.  He tells me that in the 1980s.  He had Barrett's esophagus and underwent a Nissen fundoplication.  He underwent cardiac catheterization at age 30 and was told of having normal coronary arteries.  He has remained active and exercises regularly without change in exercise tolerance.  Recently he has been able to walk on a treadmill for at least 15 minutes without shortness of breath or any discomfort.  His last  echo Doppler study in August 2016 showed normal systolic function with an EF of 55-60% and normal wall motion.  It was mild aortic insufficiency and on that echo there was no mention of aortic stenosis.  Aortic valve mobility was not restricted.  He recently presented with left lower quadrant pain and was found to have a sigmoid volvulus.  He underwent successful flexible sigmoidoscopy in the volvulus was reduced.  Due to high recurrence rate it is recommended that he undergo definitive surgery with an elective partial colectomy during this hospitalization.  His blood pressure is stable.  His atrial fibrillation rate is controlled.  His ECG from 09/04/15 independently reviewed by me shows atrial fibrillation at 72 bpm with right bundle branch block and left anterior hemiblock.  QTc interval is prolonged at 546 ms. .  On exam, HNT was unremarkable.  He did not have carotid bruits.  Lungs were clear.  Rhythm was irregularly irregular with with a controlled ventricular rate.  There was a faint 1/6 systolic murmur.  I did not appreciate a diastolic murmur.  Abdomen was soft.  Bowel sounds are present.  There was no significant edema.  Neurologic exam is grossly nonfocal.  He has been off Coumadin for 4 days.  He is undergoing bowel prep.  He will be given cardiac clearance for his planned elective partial colectomy.  I would recommend postoperative telemetry and a postoperative ECG.  If surgery is to be delayed, consider short-term heparinization in light of his permanent atrial fibrillation.  Following  surgery when he is stable anticoagulation will need to be reinstituted.   Troy Sine, MD, [facility] 09/05/2015 4:18 PM

## 2015-09-05 NOTE — Clinical Documentation Improvement (Signed)
Hospitalist  Please document query responses in the progress notes and discharge summary, not on the [facility] BPA in [facility]. Please do not deactivate queries without responding to them. Thank you!  "slow IVF as he has a h/o heart failure" is documented in th Dr. Reggy Eye progress note 09/04/15.  Home medications include Lasix and Cozaar.  If known or able to determine, please document the acuity and type of Heart Failure monitored and treated this admission: Acuity - Acute;  Chronic;  Acute on Chronic And  Type - Systolic;  Diastolic;  Combined   Please exercise your independent, professional judgment when responding. A specific answer is not anticipated or expected.   Thank You, Erling Conte  RN BSN [facility] [place]

## 2015-09-05 NOTE — Progress Notes (Signed)
PROGRESS NOTE    Ian Sullivan.  LS:[id number] DOB: Apr 05, 1927 DOA: 09/03/2015 PCP: [name], MD     Brief Narrative:   Patient with history of HTN and Afib presented to the ED with 1 week history of abdominal pain and distension. Abdominal imaging showed sigmoid volvulus. GI and surgery consulted. Was placed with NG tube and later underwent colon decompression via endoscopy. A rectal tube was left in place. Patient had 2 BM's last night and 1 this morning. Abdominal distension significantly decreased. No significant abdominal discomfort. Awaiting decision on elective partial colectomy for definitive treatment.    Assessment & Plan:   Principal Problem:   Sigmoid volvulus ([place]) Active Problems:   Essential hypertension   ATRIAL FIBRILLATION   Chronic constipation   1. Sigmoid vovulus - NG tube fell out, discontinue for now - Patient considering elective partial colectomy as definitive treatment - Surgery and GI consultation  2. Hypokalemia - Most likely due to NG tube - Supplement with KCl PO  3. Atrial Fibrillation  - Continue to hold Warfarin  4.  Chronic diastolic congestive heart failure -Last echo performed in August 2016 showed preserved ejection fraction 55-60% -Currently appears euvolemic, Lasix held as he presented with volvulus with placement of NG tube. -He likely be made nothing by mouth after midnight for surgical intervention in a.m. This will continue holding diuretic therapy  5.  Surgical clearance. -Case discussed with cardiology who will evaluate patient. -He has a history of chronic atrial fibrillation who had been anticoagulated with warfarin. His last transthoracic echocardiogram was performed on 11/21/2014 that revealed an EF of 55-60%. He does not appear to be having acute cardiopulmonary issues at this time. Await further recommendations from cardiology  DVT prophylaxis: SCD Code Status: Full Family Communication: None at  bedside Disposition Plan: Home   Consultants:   Cardiology  GI  Procedures:    None  Antimicrobials:     None   Subjective: Patient feeling better. Had 2 BM's last night and 1 this morning. Abdominal distension significantly decreased. No significant abdominal discomfort.   Objective: Filed Vitals:   09/04/15 1429 09/04/15 2054 09/04/15 2253 09/05/15 0445  BP: 140/54 177/78 149/76 149/74  Pulse: 52 70  74  Temp: 98.6 F (37 C) 98.8 F (37.1 C)  99.8 F (37.7 C)  TempSrc: Oral Oral  Oral  Resp:  18  18  Height:      Weight:      SpO2: 98% 97%  97%    Intake/Output Summary (Last 24 hours) at 09/05/15 1405 Last data filed at 09/05/15 0811  Gross per 24 hour  Intake    530 ml  Output    750 ml  Net   -220 ml   Filed Weights   09/04/15 0349  Weight: 78.7 kg (173 lb 8 oz)    Examination:  General exam: Appears calm and comfortable, in no acute distress  Respiratory system: Clear to auscultation. Respiratory effort normal. Cardiovascular system: S1 & S2 heard, RRR. No JVD, murmurs, rubs, gallops or clicks. No pedal edema. Gastrointestinal system: Abdomen slightly distended, soft and nontender. No organomegaly or masses felt. Normal bowel sounds heard. Central nervous system: Alert and oriented. No focal neurological deficits. Extremities: Symmetric 5 x 5 power. Skin: No rashes, lesions or ulcers Psychiatry: Judgement and insight appear normal. Mood & affect appropriate.     Data Reviewed: I have personally reviewed following labs and imaging studies  CBC:  Recent Labs Lab 09/03/15 1415 09/04/15 0456  WBC 8.9 7.2  NEUTROABS 6.8  --   HGB 15.0 13.6  HCT 42.6 39.5  MCV 92.4 95.2  PLT 203 [id number]   Basic Metabolic Panel:  Recent Labs Lab 09/03/15 1415 09/04/15 0456 09/05/15 0459  NA 138 139 140  K 3.3* 3.0* 3.1*  CL 102 103 104  CO2 22 25 28   GLUCOSE 107* 92 113*  BUN 23* 16 11  CREATININE 0.99 0.61 0.67  CALCIUM 9.4 8.4* 8.6*  MG  --  2.0   --    GFR: Estimated Creatinine Clearance: 70.6 mL/min (by C-G formula based on Cr of 0.67). Liver Function Tests:  Recent Labs Lab 09/03/15 1415  AST 48*  ALT 27  ALKPHOS 111  BILITOT 2.2*  PROT 8.2*  ALBUMIN 4.9   No results for input(s): LIPASE, AMYLASE in the last 168 hours. No results for input(s): AMMONIA in the last 168 hours. Coagulation Profile:  Recent Labs Lab 09/03/15 2258 09/04/15 0456 09/05/15 0459  INR 3.16* 3.35* 1.55*   Cardiac Enzymes: No results for input(s): CKTOTAL, CKMB, CKMBINDEX, TROPONINI in the last 168 hours. BNP (last 3 results) No results for input(s): PROBNP in the last 8760 hours. HbA1C: No results for input(s): HGBA1C in the last 72 hours. CBG: No results for input(s): GLUCAP in the last 168 hours. Lipid Profile: No results for input(s): CHOL, HDL, LDLCALC, TRIG, CHOLHDL, LDLDIRECT in the last 72 hours. Thyroid Function Tests: No results for input(s): TSH, T4TOTAL, FREET4, T3FREE, THYROIDAB in the last 72 hours. Anemia Panel: No results for input(s): VITAMINB12, FOLATE, FERRITIN, TIBC, IRON, RETICCTPCT in the last 72 hours. Urine analysis:    Component Value Date/Time   COLORURINE YELLOW 12/27/2007 [place] 12/27/2007 1315   LABSPEC 1.021 12/27/2007 1315   PHURINE 6.5 12/27/2007 1315   GLUCOSEU NEGATIVE 12/27/2007 1315   HGBUR NEGATIVE 12/27/2007 1315   BILIRUBINUR NEGATIVE 12/27/2007 1315   KETONESUR NEGATIVE 12/27/2007 1315   PROTEINUR NEGATIVE 12/27/2007 1315   UROBILINOGEN 1.0 12/27/2007 1315   NITRITE NEGATIVE 12/27/2007 1315   LEUKOCYTESUR  12/27/2007 1315    NEGATIVE MICROSCOPIC NOT DONE ON URINES WITH NEGATIVE PROTEIN, BLOOD, LEUKOCYTES, NITRITE, OR GLUCOSE <1000 mg/dL.   Sepsis Labs: @LABRCNTIP (procalcitonin:4,lacticidven:4)  )No results found for this or any previous visit (from the past 240 hour(s)).       Radiology Studies: Dg Abd 1 View  09/04/2015  CLINICAL DATA:  Sigmoidoscopy for  sigmoid volvulus. EXAM: ABDOMEN - 1 VIEW COMPARISON:  09/03/2015 CT FINDINGS: Pigtail catheter has been placed in the rectum. Distal colonic distention has improved. Proximal colonic distention remains prominent. No supine evidence of pneumoperitoneum. Nasogastric tube with tip at the distal stomach. IMPRESSION: After endoscopic decompression distal colonic distention is improved. Proximal colonic distention remains prominent. Electronically Signed   By: Monte Fantasia M.D.   On: 09/04/2015 03:38   Dg Abd 1 View  09/03/2015  CLINICAL DATA:  Evaluate NG tube placement EXAM: ABDOMEN - 1 VIEW COMPARISON:  KUB from earlier today FINDINGS: The distal tip of the NG tube is just below the GE junction with side port above the GE junction. Recommend advancement. Diffuse dilated loops of large and small bowel again identified. IMPRESSION: The side port of the NG tube is above the GE junction. The patient may benefit from advancing the NG tube several cm. Continued bowel distention. Electronically Signed   By: Dorise Bullion III M.D   On: 09/03/2015 16:59   Ct Abdomen Pelvis W Contrast  09/03/2015  CLINICAL DATA:  Generalized abdominal pain EXAM: CT ABDOMEN AND PELVIS WITH CONTRAST TECHNIQUE: Multidetector CT imaging of the abdomen and pelvis was performed using the standard protocol following bolus administration of intravenous contrast. CONTRAST:  100 mL Isovue-370 COMPARISON:  Plain film from earlier in the same day FINDINGS: Lung bases demonstrate some minimal right basilar atelectatic changes. No focal infiltrate or sizable effusion is noted. The liver, gallbladder, spleen, adrenal glands and pancreas are within normal limits. Kidneys are well visualized bilaterally with a normal enhancement pattern. Delayed images demonstrate normal excretion of contrast and small para pelvic cysts bilaterally. A nasogastric catheter is noted coiled within the stomach. There is significant distension of the colon identified with an  abrupt caliber change in the rectosigmoid with swirling vasculature surrounding the area of narrowing consistent with sigmoid volvulus. Significant redundancy of the sigmoid is noted. Small bowel as visualized is within normal limits. No pneumatosis or free air is identified at this time. The appendix is not visualize consistent with a prior surgical history. The bladder is well distended. No pelvic mass lesion or sidewall abnormality is noted. No acute bony abnormality is noted. Postsurgical changes in the lumbar spine are seen IMPRESSION: Changes consistent with sigmoid volvulus with significant colonic distention. No free air or pneumatosis is noted at this time. Mild bibasilar atelectasis. No other acute abnormality is noted. Critical Value/emergent results were called by telephone at the time of interpretation on 09/03/2015 at 7:13 pm to Dr. Johnney Killian , who verbally acknowledged these results. Electronically Signed   By: Inez Catalina M.D.   On: 09/03/2015 19:15   Dg Abd Acute W/chest  09/03/2015  CLINICAL DATA:  Constipation and increasing abdominal distention. EXAM: DG ABDOMEN ACUTE W/ 1V CHEST COMPARISON:  Chest x-ray 09/13/2014. FINDINGS: The heart is mildly enlarged but stable. Stable tortuosity and calcification of the thoracic aorta. Bibasilar scarring changes and eventration of the right hemidiaphragm. Stable scarring changes at the left lung base. The bony thorax is intact. They spinal cord stimulator is noted. Dilated small bowel and colon consistent with a diffuse ileus. No findings for small bowel obstruction or free air. Lower lumbar fusion hardware noted. IMPRESSION: Diffusely distended, gas-filled small bowel and colon consistent with a diffuse ileus. No definite free air. No acute cardiopulmonary findings. Electronically Signed   By: Marijo Sanes M.D.   On: 09/03/2015 15:03   Dg Abd Portable 1v  09/04/2015  CLINICAL DATA:  Abdominal distension with sigmoid volvulus EXAM: PORTABLE ABDOMEN - 1  VIEW COMPARISON:  09/04/2015 at 2:59 a.m. FINDINGS: NG tube again projects over the stomach. No change in the position of rectal catheter which projects with tip in the midline of the inferior pelvis. Diffuse gaseous distention of small and large bowel again identified including large bowel distention in the midline of the mid to upper abdomen to a diameter of 13.5 cm. IMPRESSION: No change in appearance when compared to prior study with significant persistent gaseous distensionof the large bowel. Electronically Signed   By: Skipper Cliche M.D.   On: 09/04/2015 09:48        Scheduled Meds: . antiseptic oral rinse  7 mL Mouth Rinse q12n4p  . famotidine (PEPCID) IV  20 mg Intravenous Q12H  . polyethylene glycol  17 g Oral BID  . potassium chloride  40 mEq Oral TID  . sodium chloride flush  3 mL Intravenous Q12H   Continuous Infusions: . 0.9 % NaCl with KCl 20 mEq / L 75 mL/hr at 09/05/15 1104  LOS: 1 day    Time spent: 25 mins    Amy Tasia Catchings, PA-S   [facility] Pager [phone]  If 7PM-7AM, please contact night-coverage [email or web address] Password [facility] 09/05/2015, 2:05 PM   Addendum  Personally evaluated patient on 09/05/2015 and agree with above findings. Ian Sullivan is a pleasant 82 year old with a history of chronic atrial fibrillation, anticoagulated with warfarin, who follows Dr. Stanford Breed cardiology at the office. It appears he also has a history of chronic diastolic congestive heart failure with his last echo performed in 2016 that showed a preserved ejection fraction. He presented with complaints of abdominal distention with imaging studies showing volvulus. This was decompressed endoscopically on 09/04/2015. He had subsequent clinical improvement. Gen. surgery was consulted. It is felt that he has a high risk for recurrence of volvulus without definitive therapy and thus recommended surgical intervention. Case with Dr. Hassell Done of general surgery. Patient agreeable to undergo  surgical intervention. They recommended a cardiology consultation for surgical clearance. I spoke with cardiology who will consult.

## 2015-09-05 NOTE — Progress Notes (Signed)
     [facility] Progress Note  Subjective:  Feels better.  NGT came out O/N.  No abdominal distention.  Passing stool but no flatus.  Tolerating clear liquids.  Seems interested in surgery; is nervous about risk of recurrence.  Objective:  Vital signs in last 24 hours: Temp:  [98.6 F (37 C)-99.8 F (37.7 C)] 99.8 F (37.7 C) (06/07 0445) Pulse Rate:  [52-74] 74 (06/07 0445) Resp:  [18] 18 (06/07 0445) BP: (140-177)/(54-78) 149/74 mmHg (06/07 0445) SpO2:  [97 %-98 %] 97 % (06/07 0445) Last BM Date: 09/04/15 General:  Alert, Well-developed, in NAD Heart:  Regular rate and rhythm; no murmurs Pulm:  CTAB.  No W/R/R. Abdomen:  Soft, non-distended.  BS present but still somewhat quiet.  Non-tender. Extremities:  Without edema. Neurologic:  Alert and oriented x 4;  grossly normal neurologically. Psych:  Alert and cooperative. Normal mood and affect.   Lab Results:  Recent Labs  09/03/15 1415 09/04/15 0456  WBC 8.9 7.2  HGB 15.0 13.6  HCT 42.6 39.5  PLT 203 189   BMET  Recent Labs  09/03/15 1415 09/04/15 0456 09/05/15 0459  NA 138 139 140  K 3.3* 3.0* 3.1*  CL 102 103 104  CO2 22 25 28   GLUCOSE 107* 92 113*  BUN 23* 16 11  CREATININE 0.99 0.61 0.67  CALCIUM 9.4 8.4* 8.6*    Assessment / Plan: *82 y/o male with progressive distension and obstipation over the past several days, presented with worsening symptoms. CT scan showed sigmoid volvulus. Had endoscopic reduction last early AM 6/6. NGT fell out O/N.  Passing stool, no abdominal distention, tolerating clears.  Surgery consulting given high risk of recurrence of volvulus without definitive therapy.  Continue Miralax twice daily for now.  If not having surgery then ok to advance diet to full liquids from our standpoint.  Although, he seems interested in surgery when I saw him this AM. *Hypokalemia:  Keep K+ at 4 or higher.   LOS: 1 day   ZEHR, JESSICA D.  09/05/2015, 8:39 AM  Pager number  [place] Attending   I have taken an interval history, reviewed the chart and examined the patient. I agree with the Advanced Practitioner's note, impression and recommendations.   He is going to have surgery. Needs more K - ordered 40 mEq x 2 tonight  Signing off Call us back prn  Gatha Mayer, MD, [facility] [phone] (pager) [phone] after 5 PM, weekends and holidays  09/05/2015 6:36 PM

## 2015-09-06 ENCOUNTER — Encounter (HOSPITAL_COMMUNITY): Payer: Self-pay | Admitting: Gastroenterology

## 2015-09-06 LAB — CBC
HEMATOCRIT: 36.8 % — AB (ref 39.0–52.0)
Hemoglobin: 12.6 g/dL — ABNORMAL LOW (ref 13.0–17.0)
MCH: 32.1 pg (ref 26.0–34.0)
MCHC: 34.2 g/dL (ref 30.0–36.0)
MCV: 93.6 fL (ref 78.0–100.0)
PLATELETS: 178 10*3/uL (ref 150–400)
RBC: 3.93 MIL/uL — AB (ref 4.22–5.81)
RDW: 13.8 % (ref 11.5–15.5)
WBC: 6.1 10*3/uL (ref 4.0–10.5)

## 2015-09-06 LAB — BASIC METABOLIC PANEL
Anion gap: 5 (ref 5–15)
BUN: 11 mg/dL (ref 6–20)
CHLORIDE: 108 mmol/L (ref 101–111)
CO2: 27 mmol/L (ref 22–32)
CREATININE: 0.78 mg/dL (ref 0.61–1.24)
Calcium: 8.6 mg/dL — ABNORMAL LOW (ref 8.9–10.3)
Glucose, Bld: 101 mg/dL — ABNORMAL HIGH (ref 65–99)
POTASSIUM: 4.4 mmol/L (ref 3.5–5.1)
SODIUM: 140 mmol/L (ref 135–145)

## 2015-09-06 LAB — PROTIME-INR
INR: 1.34 (ref 0.00–1.49)
Prothrombin Time: 16.7 seconds — ABNORMAL HIGH (ref 11.6–15.2)

## 2015-09-06 MED ORDER — CHLORHEXIDINE GLUCONATE CLOTH 2 % EX PADS
6.0000 | MEDICATED_PAD | Freq: Once | CUTANEOUS | Status: AC
  Administered 2015-09-07: 6 via TOPICAL

## 2015-09-06 MED ORDER — NEOMYCIN SULFATE 500 MG PO TABS
1000.0000 mg | ORAL_TABLET | ORAL | Status: AC
  Administered 2015-09-06 (×3): 1000 mg via ORAL
  Filled 2015-09-06 (×4): qty 2

## 2015-09-06 MED ORDER — METRONIDAZOLE 500 MG PO TABS
1000.0000 mg | ORAL_TABLET | ORAL | Status: AC
  Administered 2015-09-06 (×3): 1000 mg via ORAL
  Filled 2015-09-06 (×3): qty 2

## 2015-09-06 MED ORDER — ACETAMINOPHEN 500 MG PO TABS: 1000.0000 mg | ORAL_TABLET | ORAL | Status: DC

## 2015-09-06 MED ORDER — GABAPENTIN 300 MG PO CAPS: 300.0000 mg | ORAL_CAPSULE | ORAL | Status: DC

## 2015-09-06 MED ORDER — CHLORHEXIDINE GLUCONATE CLOTH 2 % EX PADS
6.0000 | MEDICATED_PAD | Freq: Once | CUTANEOUS | Status: AC
  Administered 2015-09-06: 6 via TOPICAL

## 2015-09-06 MED ORDER — DEXTROSE 5 % IV SOLN
2.0000 g | INTRAVENOUS | Status: AC
  Administered 2015-09-07: 2 g via INTRAVENOUS
  Filled 2015-09-06: qty 2

## 2015-09-06 MED ORDER — ALVIMOPAN 12 MG PO CAPS
12.0000 mg | ORAL_CAPSULE | Freq: Once | ORAL | Status: AC
  Administered 2015-09-07: 12 mg via ORAL
  Filled 2015-09-06: qty 1

## 2015-09-06 MED ORDER — POLYETHYLENE GLYCOL 3350 17 G PO PACK
17.0000 g | PACK | Freq: Three times a day (TID) | ORAL | Status: DC
  Administered 2015-09-06 (×3): 17 g via ORAL
  Filled 2015-09-06 (×2): qty 1

## 2015-09-06 NOTE — Progress Notes (Signed)
[facility] Progress Note  2 Days Post-Op  Subjective: Pt doing well.  No N/V, tolerating clears.  BM's still brown, but getting looser.  Pt getting ready for tornado drill.  No questions/concerns.  Objective: Vital signs in last 24 hours: Temp:  [98.3 F (36.8 C)-98.9 F (37.2 C)] 98.4 F (36.9 C) (06/08 0440) Pulse Rate:  [55-68] 55 (06/08 0440) Resp:  [18] 18 (06/08 0440) BP: (134-137)/(68-73) 137/73 mmHg (06/08 0440) SpO2:  [96 %-97 %] 96 % (06/08 0440) Last BM Date: 09/05/15  Intake/Output from previous day: 06/07 0701 - 06/08 0700 In: [id number] [P.O.:720; I.V.:895; IV Piggyback:100] Out: 700 [Urine:700] Intake/Output this shift:    PE: Gen: Alert, NAD, pleasant Abd: Soft, less distension, NT, +BS, no HSM   Lab Results:   Recent Labs  09/04/15 0456 09/06/15 0505  WBC 7.2 6.1  HGB 13.6 12.6*  HCT 39.5 36.8*  PLT 189 178   BMET  Recent Labs  09/05/15 0459 09/06/15 0505  NA 140 140  K 3.1* 4.4  CL 104 108  CO2 28 27  GLUCOSE 113* 101*  BUN 11 11  CREATININE 0.67 0.78  CALCIUM 8.6* 8.6*   PT/INR  Recent Labs  09/05/15 0459 09/06/15 0505  LABPROT 18.6* 16.7*  INR 1.55* 1.34   CMP     Component Value Date/Time   NA 140 09/06/2015 0505   K 4.4 09/06/2015 0505   CL 108 09/06/2015 0505   CO2 27 09/06/2015 0505   GLUCOSE 101* 09/06/2015 0505   BUN 11 09/06/2015 0505   CREATININE 0.78 09/06/2015 0505   CREATININE 0.83 04/17/2015 1535   CALCIUM 8.6* 09/06/2015 0505   PROT 8.2* 09/03/2015 1415   ALBUMIN 4.9 09/03/2015 1415   AST 48* 09/03/2015 1415   ALT 27 09/03/2015 1415   ALKPHOS 111 09/03/2015 1415   BILITOT 2.2* 09/03/2015 1415   GFRNONAA >60 09/06/2015 0505   GFRAA >60 09/06/2015 0505   Lipase  No results found for: LIPASE     Studies/Results: Dg Abd Portable 1v  09/04/2015  CLINICAL DATA:  Abdominal distension with sigmoid volvulus EXAM: PORTABLE ABDOMEN - 1 VIEW COMPARISON:  09/04/2015 at 2:59 a.m. FINDINGS: NG  tube again projects over the stomach. No change in the position of rectal catheter which projects with tip in the midline of the inferior pelvis. Diffuse gaseous distention of small and large bowel again identified including large bowel distention in the midline of the mid to upper abdomen to a diameter of 13.5 cm. IMPRESSION: No change in appearance when compared to prior study with significant persistent gaseous distensionof the large bowel. Electronically Signed   By: Skipper Cliche M.D.   On: 09/04/2015 09:48    Anti-infectives: Anti-infectives    None       Assessment/Plan Acute sigmoid volvulus leading to bowel obstruction.  -Volvulus has been reduced - flex sig decompression. He is feeling much better.  -Reoccurance rate can be 60%. Discussion of elective partial colectomy this hospitalization. He is agreeable.  Cards has cleared him -NG tube has fallen out, can keep out for now -Clears today, Plan for OR tomorrow -Gentle colon prep - miralax TID today, Goal is for clear BM's -Pre-op colon protocol orders written for Hypokalemia-normal today, Continue IVF with K Multiple medical co-morbidities and chronic anticoagulation. -INR 1.34 today    LOS: 2 days    Nat Christen 09/06/2015, 9:03 AM Pager: [phone]  (7am - 4:30pm M-F; 7am - 11:30am Sa/Su)

## 2015-09-06 NOTE — Progress Notes (Signed)
PROGRESS NOTE    Judie Bonus.  UQ:[id number] DOB: [birth year] DOA: 09/03/2015 PCP: [name], MD     Brief Narrative:   Patient with history of HTN and Afib presented to the ED with 1 week history of abdominal pain and distension. Abdominal imaging showed sigmoid volvulus. GI and surgery consulted.  Volvulus has been reduced by flex sigmoidoscopy decompression. Patient moving forward with elective partial colectomy for definitive treatment. Cleared for surgery per Cardiology. On Miralax for colon prep, currently still with brown stools, although clearing. Continue prep for clear BM's. Currently planning for OR tomorrow.   Assessment & Plan:   Principal Problem:   Sigmoid volvulus ([place]) Active Problems:   Essential hypertension   ATRIAL FIBRILLATION   Chronic constipation   Hypokalemia   1. Sigmoid vovulus - NG tube fell out, discontinue for now - Patient moving forward with elective partial colectomy as definitive treatment - Gentle colon prep with Miralax TID today per surgery. Goal is for clear BMs.  - Surgery currently planned for tomorrow.   2. Hypokalemia - Most likely due to NG tube - Finished 3 doses of KCl PO supplementation - Repeat chem panel this morning wnL at 4.4  3. Atrial Fibrillation  - Continue to hold Warfarin for surgery   4.  Chronic diastolic congestive heart failure -Last echo performed in August 2016 showed preserved ejection fraction 55-60% -Currently appears euvolemic, Lasix held as he presented with volvulus with placement of NG tube. - Continue to hold diuretic therapy in preparation for surgery tomorrow.   5.  Surgical clearance. - Patient cleared for surgery per Cardiology. - He has a history of chronic atrial fibrillation who had been anticoagulated with warfarin. His last transthoracic echocardiogram was performed on 11/21/2014 that revealed an EF of 55-60%. He does not appear to be having acute cardiopulmonary issues at this  time.  DVT prophylaxis: SCD Code Status: Full Family Communication: None at bedside Disposition Plan: Home   Consultants:   Cardiology  GI  Surgery   Procedures:    None  Antimicrobials:     None   Subjective: Patient feeling better. Had BM's on Miralax colon prep, currently still with brown stools, although clearing. Denies pain or discomfort, specifically denies pain or discomfort of the abdomen.   Objective: Filed Vitals:   09/05/15 0445 09/05/15 1500 09/05/15 2042 09/06/15 0440  BP: 149/74 135/72 134/68 137/73  Pulse: 74 68 57 55  Temp: 99.8 F (37.7 C) 98.9 F (37.2 C) 98.3 F (36.8 C) 98.4 F (36.9 C)  TempSrc: Oral Oral Oral Oral  Resp: 18 18 18 18   Height:      Weight:      SpO2: 97% 97% 97% 96%    Intake/Output Summary (Last 24 hours) at 09/06/15 1018 Last data filed at 09/06/15 0943  Gross per 24 hour  Intake   1955 ml  Output    750 ml  Net   1205 ml   Filed Weights   09/04/15 0349  Weight: 78.7 kg (173 lb 8 oz)    Examination:  General exam: Appears calm and comfortable, in no acute distress  Respiratory system: Clear to auscultation. Respiratory effort normal. Cardiovascular system: S1 & S2 heard, RRR. No JVD, murmurs, rubs, gallops or clicks. No pedal edema. Gastrointestinal system: Abdomen nondistended, soft and nontender. No organomegaly or masses felt. Normal bowel sounds heard. Central nervous system: Alert and oriented. No focal neurological deficits. Extremities: Symmetric 5 x 5 power. Skin: No rashes, lesions or  ulcers Psychiatry: Judgement and insight appear normal. Mood & affect appropriate.     Data Reviewed: I have personally reviewed following labs and imaging studies  CBC:  Recent Labs Lab 09/03/15 1415 09/04/15 0456 09/06/15 0505  WBC 8.9 7.2 6.1  NEUTROABS 6.8  --   --   HGB 15.0 13.6 12.6*  HCT 42.6 39.5 36.8*  MCV 92.4 95.2 93.6  PLT 203 189 0000000   Basic Metabolic Panel:  Recent Labs Lab  09/03/15 1415 09/04/15 0456 09/05/15 0459 09/06/15 0505  NA 138 139 140 140  K 3.3* 3.0* 3.1* 4.4  CL 102 103 104 108  CO2 22 25 28 27   GLUCOSE 107* 92 113* 101*  BUN 23* 16 11 11   CREATININE 0.99 0.61 0.67 0.78  CALCIUM 9.4 8.4* 8.6* 8.6*  MG  --  2.0  --   --    GFR: Estimated Creatinine Clearance: 70.6 mL/min (by C-G formula based on Cr of 0.78). Liver Function Tests:  Recent Labs Lab 09/03/15 1415  AST 48*  ALT 27  ALKPHOS 111  BILITOT 2.2*  PROT 8.2*  ALBUMIN 4.9   No results for input(s): LIPASE, AMYLASE in the last 168 hours. No results for input(s): AMMONIA in the last 168 hours. Coagulation Profile:  Recent Labs Lab 09/03/15 2258 09/04/15 0456 09/05/15 0459 09/06/15 0505  INR 3.16* 3.35* 1.55* 1.34   Cardiac Enzymes: No results for input(s): CKTOTAL, CKMB, CKMBINDEX, TROPONINI in the last 168 hours. BNP (last 3 results) No results for input(s): PROBNP in the last 8760 hours. HbA1C: No results for input(s): HGBA1C in the last 72 hours. CBG: No results for input(s): GLUCAP in the last 168 hours. Lipid Profile: No results for input(s): CHOL, HDL, LDLCALC, TRIG, CHOLHDL, LDLDIRECT in the last 72 hours. Thyroid Function Tests: No results for input(s): TSH, T4TOTAL, FREET4, T3FREE, THYROIDAB in the last 72 hours. Anemia Panel: No results for input(s): VITAMINB12, FOLATE, FERRITIN, TIBC, IRON, RETICCTPCT in the last 72 hours. Urine analysis:    Component Value Date/Time   COLORURINE YELLOW 12/27/2007 [place] 12/27/2007 1315   LABSPEC 1.021 12/27/2007 1315   PHURINE 6.5 12/27/2007 1315   GLUCOSEU NEGATIVE 12/27/2007 1315   HGBUR NEGATIVE 12/27/2007 1315   BILIRUBINUR NEGATIVE 12/27/2007 1315   KETONESUR NEGATIVE 12/27/2007 1315   PROTEINUR NEGATIVE 12/27/2007 1315   UROBILINOGEN 1.0 12/27/2007 1315   NITRITE NEGATIVE 12/27/2007 1315   LEUKOCYTESUR  12/27/2007 1315    NEGATIVE MICROSCOPIC NOT DONE ON URINES WITH NEGATIVE PROTEIN,  BLOOD, LEUKOCYTES, NITRITE, OR GLUCOSE <1000 mg/dL.   Sepsis Labs: @LABRCNTIP (procalcitonin:4,lacticidven:4)  )No results found for this or any previous visit (from the past 240 hour(s)).       Radiology Studies: No results found.      Scheduled Meds: . [START ON 09/07/2015] acetaminophen  1,000 mg Oral On Call to OR  . [START ON 09/07/2015] alvimopan  12 mg Oral Once  . antiseptic oral rinse  7 mL Mouth Rinse q12n4p  . [START ON 09/07/2015] cefoTEtan (CEFOTAN) 2 GM IVPB  2 g Intravenous On Call to OR  . Chlorhexidine Gluconate Cloth  6 each Topical Once   And  . Chlorhexidine Gluconate Cloth  6 each Topical Once  . famotidine (PEPCID) IV  20 mg Intravenous Q12H  . [START ON 09/07/2015] gabapentin  300 mg Oral On Call to OR  . losartan  100 mg Oral Daily  . neomycin  1,000 mg Oral 3 times per day   And  .  metroNIDAZOLE  1,000 mg Oral 3 times per day  . polyethylene glycol  17 g Oral TID  . sodium chloride flush  3 mL Intravenous Q12H   Continuous Infusions: . 0.9 % NaCl with KCl 20 mEq / L 75 mL/hr at 09/05/15 2249     LOS: 2 days    Time spent: 25 mins    Amy George Hugh   [facility] Pager [phone]  If 7PM-7AM, please contact night-coverage [email or web address] Password [email or web address] 09/06/2015, 10:18 AM   Addendum  I personally evaluated Mr. Vanwyhe on 09/06/2015 and agree with above findings. Mr. Coppes reporting doing well today, having bowel movements, denies nausea vomiting, significant improvement to abdominal distention. Case was discussed with general surgery as planned is for the continuation of Mira lax for colon prep with plans to taken to the operating room tomorrow on 09/07/2015. Warfarin has been held for procedure. A.m. labs showing INR 1.34 with PT of 16.7. On physical examination his abdomen was soft nontender nondistended. He is nontoxic appearing and in no acute distress.

## 2015-09-06 NOTE — Consult Note (Signed)
WOC ostomy consult note Presurgical marking for colostomy.  To undergo colectomy in the AM.  Understands that there is a chance he may have an ostomy as a result.   Patient is assessed in the sitting, lying and standing position.  Umbilicus is low on the abdomen and there is skin creasing just below the umbilicus.  Abdomen has been very edematous, per patient.  Site is marked 4 cm left and 2.5 cm above umbilicus, within rectus muscle.  Skin is cleansed with CHG and Tegaderm placed over marked area.   Written materials are provided, emotional support and information regarding the [facility] team's assistance was also provided.  Will see patient postoperatively as needed.    Domenic Moras RN BSN [place] Pager [phone]

## 2015-09-07 ENCOUNTER — Encounter (HOSPITAL_COMMUNITY)
Admission: EM | Disposition: A | Discharge status: Home / Self Care | Payer: Self-pay | Source: Home / Self Care | Attending: Internal Medicine

## 2015-09-07 ENCOUNTER — Inpatient Hospital Stay (HOSPITAL_COMMUNITY): Payer: Medicare Other | Admitting: Anesthesiology

## 2015-09-07 ENCOUNTER — Encounter (HOSPITAL_COMMUNITY): Payer: Self-pay | Admitting: Surgery

## 2015-09-07 DIAGNOSIS — Z9049 Acquired absence of other specified parts of digestive tract: Secondary | ICD-10-CM

## 2015-09-07 HISTORY — PX: PARTIAL COLECTOMY: SHX5273

## 2015-09-07 LAB — CBC
HCT: 36.7 % — ABNORMAL LOW (ref 39.0–52.0)
Hemoglobin: 12.2 g/dL — ABNORMAL LOW (ref 13.0–17.0)
MCH: 32.4 pg (ref 26.0–34.0)
MCHC: 33.2 g/dL (ref 30.0–36.0)
MCV: 97.6 fL (ref 78.0–100.0)
PLATELETS: 211 10*3/uL (ref 150–400)
RBC: 3.76 MIL/uL — AB (ref 4.22–5.81)
RDW: 14.1 % (ref 11.5–15.5)
WBC: 8.1 10*3/uL (ref 4.0–10.5)

## 2015-09-07 LAB — COMPREHENSIVE METABOLIC PANEL
ALBUMIN: 3.5 g/dL (ref 3.5–5.0)
ALT: 21 U/L (ref 17–63)
ANION GAP: 4 — AB (ref 5–15)
AST: 26 U/L (ref 15–41)
Alkaline Phosphatase: 74 U/L (ref 38–126)
BILIRUBIN TOTAL: 1.2 mg/dL (ref 0.3–1.2)
BUN: 11 mg/dL (ref 6–20)
CO2: 27 mmol/L (ref 22–32)
Calcium: 8.6 mg/dL — ABNORMAL LOW (ref 8.9–10.3)
Chloride: 111 mmol/L (ref 101–111)
Creatinine, Ser: 0.84 mg/dL (ref 0.61–1.24)
GFR calc Af Amer: >60 mL/min (ref >60)
GFR calc non Af Amer: >60 mL/min (ref >60)
GLUCOSE: 98 mg/dL (ref 65–99)
POTASSIUM: 4.2 mmol/L (ref 3.5–5.1)
SODIUM: 142 mmol/L (ref 135–145)
TOTAL PROTEIN: 6.1 g/dL — AB (ref 6.5–8.1)

## 2015-09-07 LAB — CBC WITH DIFFERENTIAL/PLATELET
Basophils Absolute: 0 10*3/uL (ref 0.0–0.1)
Basophils Relative: 0 %
EOS PCT: 4 %
Eosinophils Absolute: 0.2 10*3/uL (ref 0.0–0.7)
HEMATOCRIT: 37 % — AB (ref 39.0–52.0)
Hemoglobin: 12.4 g/dL — ABNORMAL LOW (ref 13.0–17.0)
LYMPHS ABS: 1.4 10*3/uL (ref 0.7–4.0)
LYMPHS PCT: 26 %
MCH: 32.5 pg (ref 26.0–34.0)
MCHC: 33.5 g/dL (ref 30.0–36.0)
MCV: 96.9 fL (ref 78.0–100.0)
MONO ABS: 0.5 10*3/uL (ref 0.1–1.0)
MONOS PCT: 10 %
NEUTROS ABS: 3.2 10*3/uL (ref 1.7–7.7)
Neutrophils Relative %: 60 %
PLATELETS: 197 10*3/uL (ref 150–400)
RBC: 3.82 MIL/uL — ABNORMAL LOW (ref 4.22–5.81)
RDW: 14 % (ref 11.5–15.5)
WBC: 5.4 10*3/uL (ref 4.0–10.5)

## 2015-09-07 LAB — SURGICAL PCR SCREEN
MRSA, PCR: NEGATIVE
Staphylococcus aureus: NEGATIVE

## 2015-09-07 LAB — PROTIME-INR
INR: 1.57 — ABNORMAL HIGH (ref 0.00–1.49)
PROTHROMBIN TIME: 18.8 s — AB (ref 11.6–15.2)

## 2015-09-07 SURGERY — COLECTOMY, PARTIAL
Anesthesia: General

## 2015-09-07 MED ORDER — SUGAMMADEX SODIUM 200 MG/2ML IV SOLN
INTRAVENOUS | Status: AC
  Filled 2015-09-07: qty 2

## 2015-09-07 MED ORDER — OXYCODONE HCL 5 MG/5ML PO SOLN
5.0000 mg | Freq: Once | ORAL | Status: DC | PRN
Start: 2015-09-07 — End: 2015-09-07

## 2015-09-07 MED ORDER — EPHEDRINE SULFATE 50 MG/ML IJ SOLN
INTRAMUSCULAR | Status: AC
Start: 2015-09-07 — End: 2015-09-07
  Filled 2015-09-07: qty 1

## 2015-09-07 MED ORDER — ONDANSETRON HCL 4 MG/2ML IJ SOLN
INTRAMUSCULAR | Status: DC | PRN
  Administered 2015-09-07: 4 mg via INTRAVENOUS

## 2015-09-07 MED ORDER — HYDROMORPHONE HCL 1 MG/ML IJ SOLN
0.2500 mg | INTRAMUSCULAR | Status: DC | PRN
  Administered 2015-09-07 (×4): 0.5 mg via INTRAVENOUS

## 2015-09-07 MED ORDER — HYDROMORPHONE HCL 1 MG/ML IJ SOLN
INTRAMUSCULAR | Status: AC
  Filled 2015-09-07: qty 1

## 2015-09-07 MED ORDER — ACETAMINOPHEN 160 MG/5ML PO SOLN: 325.0000 mg | ORAL | Status: DC | PRN

## 2015-09-07 MED ORDER — LIDOCAINE HCL (CARDIAC) 20 MG/ML IV SOLN
INTRAVENOUS | Status: DC | PRN
  Administered 2015-09-07: 50 mg via INTRAVENOUS

## 2015-09-07 MED ORDER — BUPIVACAINE LIPOSOME 1.3 % IJ SUSP
20.0000 mL | Freq: Once | INTRAMUSCULAR | Status: AC
  Administered 2015-09-07: 20 mL
  Filled 2015-09-07: qty 20

## 2015-09-07 MED ORDER — HYDROCODONE-ACETAMINOPHEN 5-325 MG PO TABS
1.0000 | ORAL_TABLET | ORAL | Status: DC | PRN
  Administered 2015-09-11: 1 via ORAL
  Filled 2015-09-07: qty 1

## 2015-09-07 MED ORDER — ALVIMOPAN 12 MG PO CAPS
12.0000 mg | ORAL_CAPSULE | Freq: Two times a day (BID) | ORAL | Status: DC
  Administered 2015-09-08 – 2015-09-11 (×7): 12 mg via ORAL
  Filled 2015-09-07 (×7): qty 1

## 2015-09-07 MED ORDER — DIPHENHYDRAMINE HCL 50 MG/ML IJ SOLN: 12.5000 mg | Freq: Four times a day (QID) | INTRAMUSCULAR | Status: DC | PRN

## 2015-09-07 MED ORDER — CEFOTETAN DISODIUM-DEXTROSE 2-2.08 GM-% IV SOLR
INTRAVENOUS | Status: AC
  Filled 2015-09-07: qty 50

## 2015-09-07 MED ORDER — SUGAMMADEX SODIUM 200 MG/2ML IV SOLN
INTRAVENOUS | Status: DC | PRN
  Administered 2015-09-07: 200 mg via INTRAVENOUS

## 2015-09-07 MED ORDER — SODIUM CHLORIDE 0.9 % IJ SOLN
INTRAMUSCULAR | Status: AC
  Filled 2015-09-07: qty 10

## 2015-09-07 MED ORDER — MORPHINE SULFATE (PF) 2 MG/ML IV SOLN
1.0000 mg | INTRAVENOUS | Status: DC | PRN
  Administered 2015-09-07: 1 mg via INTRAVENOUS
  Filled 2015-09-07: qty 1

## 2015-09-07 MED ORDER — PROPOFOL 10 MG/ML IV BOLUS
INTRAVENOUS | Status: DC | PRN
  Administered 2015-09-07: 120 mg via INTRAVENOUS

## 2015-09-07 MED ORDER — DEXTROSE 5 % IV SOLN
2.0000 g | Freq: Two times a day (BID) | INTRAVENOUS | Status: AC
  Administered 2015-09-07: 2 g via INTRAVENOUS
  Filled 2015-09-07: qty 2

## 2015-09-07 MED ORDER — OXYCODONE HCL 5 MG PO TABS: 5.0000 mg | ORAL_TABLET | Freq: Once | ORAL | Status: DC | PRN

## 2015-09-07 MED ORDER — LACTATED RINGERS IV SOLN
INTRAVENOUS | Status: DC | PRN
  Administered 2015-09-07: 07:00:00 via INTRAVENOUS

## 2015-09-07 MED ORDER — KCL IN DEXTROSE-NACL 20-5-0.45 MEQ/L-%-% IV SOLN
INTRAVENOUS | Status: DC
  Administered 2015-09-07 – 2015-09-10 (×7): via INTRAVENOUS
  Administered 2015-09-11: 1000 mL via INTRAVENOUS
  Filled 2015-09-07 (×11): qty 1000

## 2015-09-07 MED ORDER — FENTANYL CITRATE (PF) 100 MCG/2ML IJ SOLN
INTRAMUSCULAR | Status: DC | PRN
  Administered 2015-09-07 (×3): 50 ug via INTRAVENOUS

## 2015-09-07 MED ORDER — SODIUM CHLORIDE 0.9 % IJ SOLN
INTRAMUSCULAR | Status: AC
  Filled 2015-09-07: qty 50

## 2015-09-07 MED ORDER — ONDANSETRON HCL 4 MG/2ML IJ SOLN: 4.0000 mg | Freq: Four times a day (QID) | INTRAMUSCULAR | Status: DC | PRN

## 2015-09-07 MED ORDER — HEPARIN SODIUM (PORCINE) 5000 UNIT/ML IJ SOLN
5000.0000 [IU] | Freq: Three times a day (TID) | INTRAMUSCULAR | Status: DC
  Administered 2015-09-08 – 2015-09-12 (×13): 5000 [IU] via SUBCUTANEOUS
  Filled 2015-09-07 (×14): qty 1

## 2015-09-07 MED ORDER — ONDANSETRON HCL 4 MG PO TABS: 4.0000 mg | ORAL_TABLET | Freq: Four times a day (QID) | ORAL | Status: DC | PRN

## 2015-09-07 MED ORDER — FENTANYL CITRATE (PF) 250 MCG/5ML IJ SOLN
INTRAMUSCULAR | Status: AC
  Filled 2015-09-07: qty 5

## 2015-09-07 MED ORDER — ONDANSETRON HCL 4 MG/2ML IJ SOLN
INTRAMUSCULAR | Status: AC
  Filled 2015-09-07: qty 2

## 2015-09-07 MED ORDER — ACETAMINOPHEN 325 MG PO TABS: 325.0000 mg | ORAL_TABLET | ORAL | Status: DC | PRN

## 2015-09-07 MED ORDER — MORPHINE SULFATE (PF) 10 MG/ML IV SOLN: 1.0000 mg | INTRAVENOUS | Status: DC | PRN

## 2015-09-07 MED ORDER — SODIUM CHLORIDE 0.9 % IJ SOLN
INTRAMUSCULAR | Status: DC | PRN
  Administered 2015-09-07: 10 mL

## 2015-09-07 MED ORDER — LIDOCAINE HCL (CARDIAC) 20 MG/ML IV SOLN
INTRAVENOUS | Status: AC
  Filled 2015-09-07: qty 5

## 2015-09-07 MED ORDER — ROCURONIUM BROMIDE 100 MG/10ML IV SOLN
INTRAVENOUS | Status: AC
  Filled 2015-09-07: qty 1

## 2015-09-07 MED ORDER — DIPHENHYDRAMINE HCL 12.5 MG/5ML PO ELIX: 12.5000 mg | ORAL_SOLUTION | Freq: Four times a day (QID) | ORAL | Status: DC | PRN

## 2015-09-07 MED ORDER — ROCURONIUM BROMIDE 100 MG/10ML IV SOLN
INTRAVENOUS | Status: DC | PRN
  Administered 2015-09-07: 50 mg via INTRAVENOUS
  Administered 2015-09-07: 10 mg via INTRAVENOUS
  Administered 2015-09-07: 5 mg via INTRAVENOUS

## 2015-09-07 MED ORDER — PROPOFOL 10 MG/ML IV BOLUS
INTRAVENOUS | Status: AC
  Filled 2015-09-07: qty 20

## 2015-09-07 SURGICAL SUPPLY — 51 items
APPLIER CLIP 5 13 M/L LIGAMAX5 (MISCELLANEOUS)
APPLIER CLIP ROT 10 11.4 M/L (STAPLE)
APR CLP MED LRG 11.4X10 (STAPLE)
APR CLP MED LRG 5 ANG JAW (MISCELLANEOUS)
BLADE EXTENDED COATED 6.5IN (ELECTRODE) IMPLANT
CELLS DAT CNTRL 66122 CELL SVR (MISCELLANEOUS) ×1 IMPLANT
CHLORAPREP W/TINT 26ML (MISCELLANEOUS) ×3 IMPLANT
CLIP APPLIE 5 13 M/L LIGAMAX5 (MISCELLANEOUS) IMPLANT
CLIP APPLIE ROT 10 11.4 M/L (STAPLE) IMPLANT
COUNTER NEEDLE 20 DBL MAG RED (NEEDLE) ×3 IMPLANT
COVER MAYO STAND STRL (DRAPES) ×9 IMPLANT
COVER SURGICAL LIGHT HANDLE (MISCELLANEOUS) ×6 IMPLANT
DECANTER SPIKE VIAL GLASS SM (MISCELLANEOUS) ×1 IMPLANT
DRAPE LAPAROSCOPIC ABDOMINAL (DRAPES) ×3 IMPLANT
DRSG OPSITE POSTOP 4X10 (GAUZE/BANDAGES/DRESSINGS) ×2 IMPLANT
DRSG OPSITE POSTOP 4X6 (GAUZE/BANDAGES/DRESSINGS) IMPLANT
DRSG OPSITE POSTOP 4X8 (GAUZE/BANDAGES/DRESSINGS) IMPLANT
ELECT PENCIL ROCKER SW 15FT (MISCELLANEOUS) ×6 IMPLANT
ELECT REM PT RETURN 15FT ADLT (MISCELLANEOUS) ×3 IMPLANT
GAUZE SPONGE 4X4 12PLY STRL (GAUZE/BANDAGES/DRESSINGS) IMPLANT
GLOVE BIOGEL M 8.0 STRL (GLOVE) ×6 IMPLANT
GOWN STRL REUS W/TWL XL LVL3 (GOWN DISPOSABLE) ×18 IMPLANT
HANDLE STAPLE EGIA 4 XL (STAPLE) ×2 IMPLANT
LEGGING LITHOTOMY PAIR STRL (DRAPES) ×3 IMPLANT
LIGASURE IMPACT 36 18CM CVD LR (INSTRUMENTS) IMPLANT
PACK COLON (CUSTOM PROCEDURE TRAY) ×3 IMPLANT
PAD POSITIONING PINK XL (MISCELLANEOUS) ×3 IMPLANT
POSITIONER SURGICAL ARM (MISCELLANEOUS) ×6 IMPLANT
RELOAD EGIA 60 MED/THCK PURPLE (STAPLE) ×6 IMPLANT
RELOAD STAPLE 60 BLK XTHK ART (STAPLE) IMPLANT
RELOAD STAPLE 60 MED/THCK ART (STAPLE) IMPLANT
RELOAD TRI 2.0 60 XTHK VAS SUL (STAPLE) ×6 IMPLANT
RETRACTOR WND ALEXIS 18 MED (MISCELLANEOUS) IMPLANT
RTRCTR WOUND ALEXIS 18CM MED (MISCELLANEOUS) ×3
STAPLER VISISTAT 35W (STAPLE) ×3 IMPLANT
SUT PDS AB 1 TP1 96 (SUTURE) ×4 IMPLANT
SUT PDS AB 4-0 SH 27 (SUTURE) ×4 IMPLANT
SUT SILK 2 0 (SUTURE) ×3
SUT SILK 2 0 SH CR/8 (SUTURE) ×2 IMPLANT
SUT SILK 2-0 18XBRD TIE 12 (SUTURE) ×1 IMPLANT
SUT SILK 3 0 (SUTURE) ×3
SUT SILK 3 0 SH CR/8 (SUTURE) ×4 IMPLANT
SUT SILK 3-0 FS1 18XBRD (SUTURE) IMPLANT
SUT VIC AB 3-0 SH 27 (SUTURE) ×3
SUT VIC AB 3-0 SH 27XBRD (SUTURE) IMPLANT
SUT VIC AB 4-0 SH 18 (SUTURE) ×2 IMPLANT
TOWEL OR NON WOVEN STRL DISP B (DISPOSABLE) ×3 IMPLANT
TRAY FOLEY W/METER SILVER 14FR (SET/KITS/TRAYS/PACK) IMPLANT
TRAY FOLEY W/METER SILVER 16FR (SET/KITS/TRAYS/PACK) ×2 IMPLANT
TUBING CONNECTING 10 (TUBING) IMPLANT
TUBING CONNECTING 10' (TUBING)

## 2015-09-07 NOTE — Interval H&P Note (Signed)
History and Physical Interval Note:  09/07/2015 7:30 AM  Judie Bonus.  has presented today for surgery, with the diagnosis of sigmoid volvulus  The various methods of treatment have been discussed with the patient and family. After consideration of risks, benefits and other options for treatment, the patient has consented to  Procedure(s): OPEN PARTIAL COLECTOMY (N/A) as a surgical intervention .  The patient's history has been reviewed, patient examined, no change in status, stable for surgery.  I have reviewed the patient's chart and labs.  Questions were answered to the patient's satisfaction.     Gerri Acre B

## 2015-09-07 NOTE — Anesthesia Preprocedure Evaluation (Addendum)
Anesthesia Evaluation  Patient identified by MRN, date of birth, ID band Patient awake    Reviewed: Allergy & Precautions, NPO status , Patient's Chart, lab work & pertinent test results  History of Anesthesia Complications Negative for: history of anesthetic complications  Airway Mallampati: I  TM Distance: >3 FB Neck ROM: Full    Dental  (+) Teeth Intact   Pulmonary sleep apnea , former smoker,    breath sounds clear to auscultation       Cardiovascular hypertension, Pt. on medications +CHF  + dysrhythmias Atrial Fibrillation + Valvular Problems/Murmurs MR  Rhythm:Irregular     Neuro/Psych PSYCHIATRIC DISORDERS Anxiety Depression B/l mild LE weakness per patient, chronic low back pain with spinal cord stimulator  Neuromuscular disease    GI/Hepatic Neg liver ROS, hiatal hernia, GERD  Medicated and Controlled,  Endo/Other  negative endocrine ROS  Renal/GU negative Renal ROS     Musculoskeletal   Abdominal   Peds  Hematology  (+) anemia , Chronic anticoag with INR 1.57   Anesthesia Other Findings   Reproductive/Obstetrics                            Anesthesia Physical Anesthesia Plan  ASA: III  Anesthesia Plan: General   Post-op Pain Management:    Induction: Intravenous  Airway Management Planned: Oral ETT  Additional Equipment: None  Intra-op Plan:   Post-operative Plan: Extubation in OR  Informed Consent: I have reviewed the patients History and Physical, chart, labs and discussed the procedure including the risks, benefits and alternatives for the proposed anesthesia with the patient or authorized representative who has indicated his/her understanding and acceptance.   Dental advisory given  Plan Discussed with: CRNA and Surgeon  Anesthesia Plan Comments:         Anesthesia Quick Evaluation

## 2015-09-07 NOTE — Transfer of Care (Signed)
Immediate Anesthesia Transfer of Care Note  Patient: Nicklus Dockendorf.  Procedure(s) Performed: Procedure(s): OPEN SIGMOID COLECTOMY WITH ANASTAMOSIS (N/A)  Patient Location: PACU  Anesthesia Type:General  Level of Consciousness: awake, alert  and oriented  Airway & Oxygen Therapy: Patient Spontanous Breathing and Patient connected to face mask oxygen  Post-op Assessment: Report given to RN and Post -op Vital signs reviewed and stable  Post vital signs: Reviewed and stable  Last Vitals:  Filed Vitals:   09/06/15 2030 09/07/15 0539  BP: 150/74 145/78  Pulse: 64 64  Temp: 36.9 C 36.7 C  Resp: 18 18    Last Pain:  Filed Vitals:   09/07/15 0539  PainSc: 0-No pain         Complications: No apparent anesthesia complications

## 2015-09-07 NOTE — H&P (View-Only) (Signed)
[facility] Progress Note  2 Days Post-Op  Subjective: Pt doing well.  No N/V, tolerating clears.  BM's still brown, but getting looser.  Pt getting ready for tornado drill.  No questions/concerns.  Objective: Vital signs in last 24 hours: Temp:  [98.3 F (36.8 C)-98.9 F (37.2 C)] 98.4 F (36.9 C) (06/08 0440) Pulse Rate:  [55-68] 55 (06/08 0440) Resp:  [18] 18 (06/08 0440) BP: (134-137)/(68-73) 137/73 mmHg (06/08 0440) SpO2:  [96 %-97 %] 96 % (06/08 0440) Last BM Date: 09/05/15  Intake/Output from previous day: 06/07 0701 - 06/08 0700 In: [id number] [P.O.:720; I.V.:895; IV Piggyback:100] Out: 700 [Urine:700] Intake/Output this shift:    PE: Gen: Alert, NAD, pleasant Abd: Soft, less distension, NT, +BS, no HSM   Lab Results:   Recent Labs  09/04/15 0456 09/06/15 0505  WBC 7.2 6.1  HGB 13.6 12.6*  HCT 39.5 36.8*  PLT 189 178   BMET  Recent Labs  09/05/15 0459 09/06/15 0505  NA 140 140  K 3.1* 4.4  CL 104 108  CO2 28 27  GLUCOSE 113* 101*  BUN 11 11  CREATININE 0.67 0.78  CALCIUM 8.6* 8.6*   PT/INR  Recent Labs  09/05/15 0459 09/06/15 0505  LABPROT 18.6* 16.7*  INR 1.55* 1.34   CMP     Component Value Date/Time   NA 140 09/06/2015 0505   K 4.4 09/06/2015 0505   CL 108 09/06/2015 0505   CO2 27 09/06/2015 0505   GLUCOSE 101* 09/06/2015 0505   BUN 11 09/06/2015 0505   CREATININE 0.78 09/06/2015 0505   CREATININE 0.83 04/17/2015 1535   CALCIUM 8.6* 09/06/2015 0505   PROT 8.2* 09/03/2015 1415   ALBUMIN 4.9 09/03/2015 1415   AST 48* 09/03/2015 1415   ALT 27 09/03/2015 1415   ALKPHOS 111 09/03/2015 1415   BILITOT 2.2* 09/03/2015 1415   GFRNONAA >60 09/06/2015 0505   GFRAA >60 09/06/2015 0505   Lipase  No results found for: LIPASE     Studies/Results: Dg Abd Portable 1v  09/04/2015  CLINICAL DATA:  Abdominal distension with sigmoid volvulus EXAM: PORTABLE ABDOMEN - 1 VIEW COMPARISON:  09/04/2015 at 2:59 a.m. FINDINGS: NG  tube again projects over the stomach. No change in the position of rectal catheter which projects with tip in the midline of the inferior pelvis. Diffuse gaseous distention of small and large bowel again identified including large bowel distention in the midline of the mid to upper abdomen to a diameter of 13.5 cm. IMPRESSION: No change in appearance when compared to prior study with significant persistent gaseous distensionof the large bowel. Electronically Signed   By: Skipper Cliche M.D.   On: 09/04/2015 09:48    Anti-infectives: Anti-infectives    None       Assessment/Plan Acute sigmoid volvulus leading to bowel obstruction.  -Volvulus has been reduced - flex sig decompression. He is feeling much better.  -Reoccurance rate can be 60%. Discussion of elective partial colectomy this hospitalization. He is agreeable.  Cards has cleared him -NG tube has fallen out, can keep out for now -Clears today, Plan for OR tomorrow -Gentle colon prep - miralax TID today, Goal is for clear BM's -Pre-op colon protocol orders written for Hypokalemia-normal today, Continue IVF with K Multiple medical co-morbidities and chronic anticoagulation. -INR 1.34 today    LOS: 2 days    Nat Christen 09/06/2015, 9:03 AM Pager: [phone]  (7am - 4:30pm M-F; 7am - 11:30am Sa/Su)

## 2015-09-07 NOTE — Brief Op Note (Signed)
09/03/2015 - 09/07/2015  9:56 AM  PATIENT:  Ian Sullivan.  82 y.o. male  PRE-OPERATIVE DIAGNOSIS:  sigmoid volvulus  POST-OPERATIVE DIAGNOSIS:  sigmoid volvulus  PROCEDURE:  Procedure(s): OPEN SIGMOID COLECTOMY WITH ANASTAMOSIS (N/A)  SURGEON:  Surgeon(s) and Role:    * Johnathan Hausen, MD - Primary  PHYSICIAN ASSISTANT:   ASSISTANTS: none   ANESTHESIA:   general  EBL:  Total I/O In: 800 [I.V.:800] Out: 420 [Urine:400; Blood:20]  BLOOD ADMINISTERED:none  DRAINS: none   LOCAL MEDICATIONS USED:  BUPIVICAINE   SPECIMEN:  Source of Specimen:  sigmoid colon  DISPOSITION OF SPECIMEN:  PATHOLOGY  COUNTS:  YES  TOURNIQUET:  * No tourniquets in log *  DICTATION: .Other Dictation: Dictation Number [id number]  PLAN OF CARE: Patient to return to room at [place]:  PACU - hemodynamically stable.   Delay start of Pharmacological VTE agent (>24hrs) due to surgical blood loss or risk of bleeding: yes

## 2015-09-07 NOTE — Anesthesia Procedure Notes (Signed)
Procedure Name: Intubation Date/Time: 09/07/2015 7:42 AM Performed by: Glory Buff Pre-anesthesia Checklist: Patient identified, Emergency Drugs available, Suction available and Patient being monitored Patient Re-evaluated:Patient Re-evaluated prior to inductionOxygen Delivery Method: Circle system utilized Preoxygenation: Pre-oxygenation with 100% oxygen Intubation Type: IV induction Ventilation: Mask ventilation without difficulty Laryngoscope Size: Miller and 3 Grade View: Grade I Tube type: Oral Tube size: 8.0 mm Number of attempts: 1 Airway Equipment and Method: Stylet and Oral airway Placement Confirmation: ETT inserted through vocal cords under direct vision,  positive ETCO2 and breath sounds checked- equal and bilateral Secured at: 22 cm Tube secured with: Tape Dental Injury: Teeth and Oropharynx as per pre-operative assessment

## 2015-09-07 NOTE — Progress Notes (Signed)
PROGRESS NOTE    Judie Bonus.  UQ:[id number] DOB: Feb 14, 1928 DOA: 09/03/2015 PCP: [name], MD     Brief Narrative:   Patient with history of HTN and Afib presented to the ED with 1 week history of abdominal pain and distension. Abdominal imaging showed sigmoid volvulus. GI and surgery consulted.  Volvulus has been reduced by flex sigmoidoscopy decompression. Patient moving forward with elective partial colectomy for definitive treatment. Cleared for surgery per Cardiology. On Miralax for colon prep, currently still with brown stools, although clearing. Continue prep for clear BM's. Currently planning for OR tomorrow.   Assessment & Plan:   Principal Problem:   S/P sigmoid colectomy June 2017 Active Problems:   Essential hypertension   ATRIAL FIBRILLATION   Sigmoid volvulus ([facility])   Chronic constipation   Hypokalemia   1. Sigmoid vovulus - Showing clinical improvement with gastric decompression and conservative approach. -Gen. surgery felt that he was at high risk for recurrence for which partial colectomy was recommended. -On 09/07/2015 years taken to the operating room where he underwent open sigmoid colectomy with anastomosis. Patient is being seen postoperatively, remains sedated. Otherwise no immediate complications noted.  2. Hypokalemia - Most likely due to NG tube - Finished 3 doses of KCl PO supplementation - A.m. lab work showing stable potassium of 4.4.  3. Atrial Fibrillation  -Plan to restart warfarin when cleared by surgery  4.  Chronic diastolic congestive heart failure -Last echo performed in August 2016 showed preserved ejection fraction 55-60% -Currently appears euvolemic, Lasix held as he presented with volvulus with placement of NG tube. -Patient having little by mouth intake today, we'll continue holding Lasix  5.  Surgical clearance. - Patient cleared for surgery per Cardiology. - He has a history of chronic atrial fibrillation who had been  anticoagulated with warfarin. His last transthoracic echocardiogram was performed on 11/21/2014 that revealed an EF of 55-60%. He does not appear to be having acute cardiopulmonary issues at this time.  DVT prophylaxis: Cutaneous heparin Code Status: Full Family Communication: I spoke to family members at bedside Disposition Plan: Home   Consultants:   Cardiology  GI  Surgery   Procedures:    None  Antimicrobials:     None   Subjective: He is being seen postoperatively, remains sedated  Objective: Filed Vitals:   09/07/15 1017 09/07/15 1030 09/07/15 1043 09/07/15 1418  BP:  155/73 165/73 160/84  Pulse: 62 54 66 78  Temp:  98 F (36.7 C) 98.2 F (36.8 C) 97.5 F (36.4 C)  TempSrc:    Oral  Resp: 19 20 16 18   Height:      Weight:      SpO2: 98% 98% 97% 100%    Intake/Output Summary (Last 24 hours) at 09/07/15 1534 Last data filed at 09/07/15 1418  Gross per 24 hour  Intake   2025 ml  Output   1895 ml  Net    130 ml   Filed Weights   09/04/15 0349  Weight: 78.7 kg (173 lb 8 oz)    Examination:  General exam: He is sedated, difficult to arouse, he is being seen postoperatively. Respiratory system: Clear to auscultation. Respiratory effort normal. Cardiovascular system: S1 & S2 heard, RRR. No JVD, murmurs, rubs, gallops or clicks. No pedal edema. Gastrointestinal system: Abdomen appears to be soft, did not elicit pain to palpation Central nervous system: Alert and oriented. No focal neurological deficits. Extremities: Symmetric 5 x 5 power. Skin: No rashes, lesions or ulcers Psychiatry:  He is sedated from anesthesia    Data Reviewed: I have personally reviewed following labs and imaging studies  CBC:  Recent Labs Lab 09/03/15 1415 09/04/15 0456 09/06/15 0505 09/07/15 0427  WBC 8.9 7.2 6.1 5.4  NEUTROABS 6.8  --   --  3.2  HGB 15.0 13.6 12.6* 12.4*  HCT 42.6 39.5 36.8* 37.0*  MCV 92.4 95.2 93.6 96.9  PLT 203 189 178 [id number]   Basic Metabolic  Panel:  Recent Labs Lab 09/03/15 1415 09/04/15 0456 09/05/15 0459 09/06/15 0505 09/07/15 0427  NA 138 139 140 140 142  K 3.3* 3.0* 3.1* 4.4 4.2  CL 102 103 104 108 111  CO2 22 25 28 27 27   GLUCOSE 107* 92 113* 101* 98  BUN 23* 16 11 11 11   CREATININE 0.99 0.61 0.67 0.78 0.84  CALCIUM 9.4 8.4* 8.6* 8.6* 8.6*  MG  --  2.0  --   --   --    GFR: Estimated Creatinine Clearance: 67.2 mL/min (by C-G formula based on Cr of 0.84). Liver Function Tests:  Recent Labs Lab 09/03/15 1415 09/07/15 0427  AST 48* 26  ALT 27 21  ALKPHOS 111 74  BILITOT 2.2* 1.2  PROT 8.2* 6.1*  ALBUMIN 4.9 3.5   No results for input(s): LIPASE, AMYLASE in the last 168 hours. No results for input(s): AMMONIA in the last 168 hours. Coagulation Profile:  Recent Labs Lab 09/03/15 2258 09/04/15 0456 09/05/15 0459 09/06/15 0505 09/07/15 0427  INR 3.16* 3.35* 1.55* 1.34 1.57*   Cardiac Enzymes: No results for input(s): CKTOTAL, CKMB, CKMBINDEX, TROPONINI in the last 168 hours. BNP (last 3 results) No results for input(s): PROBNP in the last 8760 hours. HbA1C: No results for input(s): HGBA1C in the last 72 hours. CBG: No results for input(s): GLUCAP in the last 168 hours. Lipid Profile: No results for input(s): CHOL, HDL, LDLCALC, TRIG, CHOLHDL, LDLDIRECT in the last 72 hours. Thyroid Function Tests: No results for input(s): TSH, T4TOTAL, FREET4, T3FREE, THYROIDAB in the last 72 hours. Anemia Panel: No results for input(s): VITAMINB12, FOLATE, FERRITIN, TIBC, IRON, RETICCTPCT in the last 72 hours. Urine analysis:    Component Value Date/Time   COLORURINE YELLOW 12/27/2007 [place] 12/27/2007 1315   LABSPEC 1.021 12/27/2007 1315   PHURINE 6.5 12/27/2007 1315   GLUCOSEU NEGATIVE 12/27/2007 1315   HGBUR NEGATIVE 12/27/2007 1315   BILIRUBINUR NEGATIVE 12/27/2007 1315   KETONESUR NEGATIVE 12/27/2007 1315   PROTEINUR NEGATIVE 12/27/2007 1315   UROBILINOGEN 1.0 12/27/2007 1315    NITRITE NEGATIVE 12/27/2007 1315   LEUKOCYTESUR  12/27/2007 1315    NEGATIVE MICROSCOPIC NOT DONE ON URINES WITH NEGATIVE PROTEIN, BLOOD, LEUKOCYTES, NITRITE, OR GLUCOSE <1000 mg/dL.   Sepsis Labs: @LABRCNTIP (procalcitonin:4,lacticidven:4)  ) Recent Results (from the past 240 hour(s))  Surgical pcr screen     Status: None   Collection Time: 09/07/15  5:04 AM  Result Value Ref Range Status   MRSA, PCR NEGATIVE NEGATIVE Final   Staphylococcus aureus NEGATIVE NEGATIVE Final    Comment:        The Xpert SA Assay (FDA approved for NASAL specimens in patients over 75 years of age), is one component of a comprehensive surveillance program.  Test performance has been validated by [facility] for patients greater than or equal to 64 year old. It is not intended to diagnose infection nor to guide or monitor treatment.          Radiology Studies: No results found.  Scheduled Meds: . [START ON 09/08/2015] alvimopan  12 mg Oral BID  . cefoTEtan (CEFOTAN) IV  2 g Intravenous Q12H  . [START ON 09/08/2015] heparin subcutaneous  5,000 Units Subcutaneous Q8H  . HYDROmorphone      . HYDROmorphone       Continuous Infusions: . dextrose 5 % and 0.45 % NaCl with KCl 20 mEq/L 100 mL/hr at 09/07/15 1204     LOS: 3 days    Time spent: 25 mins    Kelvin Cellar, MD   [facility] Pager [phone]  If 7PM-7AM, please contact night-coverage [email or web address] Password [facility] 09/07/2015, 3:34 PM

## 2015-09-07 NOTE — Anesthesia Postprocedure Evaluation (Signed)
Anesthesia Post Note  Patient: Ian Sullivan.  Procedure(s) Performed: Procedure(s) (LRB): OPEN SIGMOID COLECTOMY WITH ANASTAMOSIS (N/A)  Patient location during evaluation: PACU Anesthesia Type: General Level of consciousness: awake Pain management: pain level controlled Vital Signs Assessment: post-procedure vital signs reviewed and stable Respiratory status: spontaneous breathing Cardiovascular status: stable Postop Assessment: no signs of nausea or vomiting Anesthetic complications: no    Last Vitals:  Filed Vitals:   09/07/15 1030 09/07/15 1043  BP: 155/73 165/73  Pulse: 54 66  Temp: 36.7 C 36.8 C  Resp: 20 16    Last Pain:  Filed Vitals:   09/07/15 1045  PainSc: 4                  Randolf Sansoucie

## 2015-09-08 DIAGNOSIS — I480 Paroxysmal atrial fibrillation: Secondary | ICD-10-CM

## 2015-09-08 LAB — BASIC METABOLIC PANEL
Anion gap: 5 (ref 5–15)
BUN: 9 mg/dL (ref 6–20)
CHLORIDE: 106 mmol/L (ref 101–111)
CO2: 29 mmol/L (ref 22–32)
CREATININE: 0.82 mg/dL (ref 0.61–1.24)
Calcium: 8.4 mg/dL — ABNORMAL LOW (ref 8.9–10.3)
GFR calc Af Amer: >60 mL/min (ref >60)
GFR calc non Af Amer: >60 mL/min (ref >60)
Glucose, Bld: 111 mg/dL — ABNORMAL HIGH (ref 65–99)
Potassium: 3.6 mmol/L (ref 3.5–5.1)
SODIUM: 140 mmol/L (ref 135–145)

## 2015-09-08 LAB — CBC
HCT: 37.5 % — ABNORMAL LOW (ref 39.0–52.0)
Hemoglobin: 12.5 g/dL — ABNORMAL LOW (ref 13.0–17.0)
MCH: 32.6 pg (ref 26.0–34.0)
MCHC: 33.3 g/dL (ref 30.0–36.0)
MCV: 97.7 fL (ref 78.0–100.0)
PLATELETS: 205 10*3/uL (ref 150–400)
RBC: 3.84 MIL/uL — ABNORMAL LOW (ref 4.22–5.81)
RDW: 14 % (ref 11.5–15.5)
WBC: 5.8 10*3/uL (ref 4.0–10.5)

## 2015-09-08 LAB — PROTIME-INR
INR: 1.71 — ABNORMAL HIGH (ref 0.00–1.49)
Prothrombin Time: 20 seconds — ABNORMAL HIGH (ref 11.6–15.2)

## 2015-09-08 MED ORDER — ACETAMINOPHEN 10 MG/ML IV SOLN
1000.0000 mg | Freq: Once | INTRAVENOUS | Status: AC
  Administered 2015-09-08: 1000 mg via INTRAVENOUS
  Filled 2015-09-08: qty 100

## 2015-09-08 MED ORDER — ACETAMINOPHEN 500 MG PO TABS
1000.0000 mg | ORAL_TABLET | Freq: Once | ORAL | Status: AC
  Administered 2015-09-08: 1000 mg via ORAL
  Filled 2015-09-08: qty 2

## 2015-09-08 MED ORDER — ACETAMINOPHEN 325 MG PO TABS
650.0000 mg | ORAL_TABLET | Freq: Four times a day (QID) | ORAL | Status: DC | PRN
  Administered 2015-09-08: 650 mg via ORAL
  Filled 2015-09-08 (×2): qty 2

## 2015-09-08 NOTE — Progress Notes (Signed)
Primary cardiologist: Dr. Kirk Ruths  Seen for followup: Atrial fibrillation  Subjective:    No major degree of abdominal discomfort, no chest pain or palpitations. Taking ice chips only.  Objective:   Temp:  [97.4 F (36.3 C)-98.7 F (37.1 C)] 97.4 F (36.3 C) (06/10 0452) Pulse Rate:  [50-78] 50 (06/10 0452) Resp:  [11-20] 18 (06/10 0452) BP: (124-186)/(60-84) 124/60 mmHg (06/10 0452) SpO2:  [95 %-100 %] 99 % (06/10 0452) Last BM Date: 09/06/15  Filed Weights   09/04/15 0349  Weight: 173 lb 8 oz (78.7 kg)    Intake/Output Summary (Last 24 hours) at 09/08/15 0811 Last data filed at 09/08/15 0700  Gross per 24 hour  Intake 2913.33 ml  Output   2280 ml  Net 633.33 ml    Telemetry: Currently not on telemetry.  Exam:  General: Elderly male in no distress.  Lungs: Clear, nonlabored.  Cardiac: Irregularly irregular, no gallop.  Abdomen: Decreased bowel sounds.  Extremities: No pitting edema.  Lab Results:  Basic Metabolic Panel:  Recent Labs Lab 09/04/15 0456  09/06/15 0505 09/07/15 0427 09/08/15 0444  NA 139  < > 140 142 140  K 3.0*  < > 4.4 4.2 3.6  CL 103  < > 108 111 106  CO2 25  < > 27 27 29   GLUCOSE 92  < > 101* 98 111*  BUN 16  < > 11 11 9   CREATININE 0.61  < > 0.78 0.84 0.82  CALCIUM 8.4*  < > 8.6* 8.6* 8.4*  MG 2.0  --   --   --   --   < > = values in this interval not displayed.  Liver Function Tests:  Recent Labs Lab 09/03/15 1415 09/07/15 0427  AST 48* 26  ALT 27 21  ALKPHOS 111 74  BILITOT 2.2* 1.2  PROT 8.2* 6.1*  ALBUMIN 4.9 3.5    CBC:  Recent Labs Lab 09/07/15 0427 09/07/15 1859 09/08/15 0444  WBC 5.4 8.1 5.8  HGB 12.4* 12.2* 12.5*  HCT 37.0* 36.7* 37.5*  MCV 96.9 97.6 97.7  PLT 197 211 205    Coagulation:  Recent Labs Lab 09/06/15 0505 09/07/15 0427 09/08/15 0444  INR 1.34 1.57* 1.71*   Echocardiogram 11/21/2014: Study Conclusions  - Left ventricle: The cavity size was normal. There was  mild  concentric hypertrophy. Systolic function was normal. The  estimated ejection fraction was in the range of 55% to 60%. Wall  motion was normal; there were no regional wall motion  abnormalities. The study is not technically sufficient to allow  evaluation of LV diastolic function. Doppler parameters are  consistent with elevated ventricular end-diastolic filling  pressure. - Aortic valve: There was mild regurgitation. - Mitral valve: Calcified annulus. There was mild regurgitation. - Left atrium: The atrium was severely dilated. - Right ventricle: The cavity size was mildly dilated. Wall  thickness was normal. Systolic function was mildly reduced. - Right atrium: The atrium was moderately dilated. - Tricuspid valve: There was moderate regurgitation.   Medications:   Scheduled Medications: . acetaminophen  1,000 mg Intravenous Once  . alvimopan  12 mg Oral BID  . heparin subcutaneous  5,000 Units Subcutaneous Q8H     Infusions: . dextrose 5 % and 0.45 % NaCl with KCl 20 mEq/L 100 mL/hr at 09/07/15 2328     PRN Medications:  diphenhydrAMINE **OR** diphenhydrAMINE, HYDROcodone-acetaminophen, morphine injection, ondansetron **OR** ondansetron (ZOFRAN) IV   Assessment:   1. Chronic atrial fibrillation, typically  on Coumadin as an outpatient but not requiring any specific heart rate control medications.  2. SBO secondary to sigmoid volvulus now status post sigmoid colectomy with anastomosis on June 9. Coumadin remains on hold. He is still not taking any oral medications as yet.   Plan/Discussion:    Check postoperative ECG. Otherwise continue observation. As he is able to advance POs, can work back toward his outpatient regimen including Coumadin.   Satira Sark, M.D., F.A.C.C.

## 2015-09-08 NOTE — Evaluation (Signed)
Physical Therapy Evaluation Patient Details Name: Ian Sullivan. MRN: [id number] DOB: [birth year] Today's Date: 09/08/2015   History of Present Illness  82 yo admitted with progression abdominal pain and distention.  He was found to have a volvulus that was decompressed and he underwent a partial colectomy 09/07/2015.  Pt reports he has had 3 back surgeries and has lots of PT before   Clinical Impression  Pt did extremely well with ambulation a long distance in the hallway with a rolling walker.  He does think he needs HHPT.  Will follow up for PT visit on Monday to progress to stair training and assess bed mobility independence.   Follow Up Recommendations No PT follow up (pt to tell MD if he wants HHPT, currently he does not )    Equipment Recommendations  None recommended by PT    Recommendations for Other Services OT consult     Precautions / Restrictions Restrictions Weight Bearing Restrictions: No      Mobility  Bed Mobility Overal bed mobility:  (pt up in chair and wanted to return to chair)                Transfers Overall transfer level: Needs assistance Equipment used: Rolling walker (2 wheeled) Transfers: Sit to/from Stand           General transfer comment: pt needs occsional verbal cues.  Is able to push up with arms to come to standing   Ambulation/Gait Ambulation/Gait assistance: Min guard Ambulation Distance (Feet):  (>200) Assistive device: Rolling walker (2 wheeled) Gait Pattern/deviations: Step-through pattern;Wide base of support     General Gait Details: pt was able to hold erect posture  Stairs            Wheelchair Mobility    Modified Rankin (Stroke Patients Only)       Balance Overall balance assessment: No apparent balance deficits (not formally assessed)                                           Pertinent Vitals/Pain Pain Assessment: No/denies pain (only mild pain in abdomen at end of walk that  resolved)    Home Living Family/patient expects to be discharged to:: Private residence Living Arrangements: Alone Available Help at Discharge: Family;Friend(s);Available PRN/intermittently Type of Home: House Home Access: Stairs to enter Entrance Stairs-Rails: Right;Left;Can reach both Entrance Stairs-Number of Steps: 6 Home Layout: Able to live on main level with bedroom/bathroom Home Equipment: Walker - 2 wheels;Cane - single point      Prior Function Level of Independence: Independent with assistive device(s)         Comments: uses a straight  cane      Hand Dominance        Extremity/Trunk Assessment               Lower Extremity Assessment: Overall WFL for tasks assessed (pt report neuropathy symptoms )         Communication   Communication: No difficulties  Cognition Arousal/Alertness: Awake/alert Behavior During Therapy: WFL for tasks assessed/performed                        General Comments General comments (skin integrity, edema, etc.): Pt has abdominal incision, and has mild guarding positioning.     Exercises        Assessment/Plan  PT Assessment    PT Diagnosis Generalized weakness   PT Problem List    PT Treatment Interventions     PT Goals (Current goals can be found in the Care Plan section) Acute Rehab PT Goals Patient Stated Goal: to go home and to get some help for daily activities  PT Goal Formulation: With patient Time For Goal Achievement: 09/22/15 Potential to Achieve Goals: Good    Frequency     Barriers to discharge        Co-evaluation               End of Session Equipment Utilized During Treatment: Gait belt Activity Tolerance: Patient tolerated treatment well (O2 sats 99% and HR 84 after ambulation ) Patient left: in chair;with chair alarm set;with family/visitor present           Time: 1223-1250 PT Time Calculation (min) (ACUTE ONLY): 27 min   Charges:   PT Evaluation $PT Eval  Low Complexity: 1 Procedure     PT G Codes:       Jaquelyn Sakamoto K. Owens Shark, PT  Norwood Levo 09/08/2015, 2:27 PM

## 2015-09-08 NOTE — Progress Notes (Signed)
1 Day Post-Op  Subjective: No complaints. Denies any pain or nausea  Objective: Vital signs in last 24 hours: Temp:  [97.4 F (36.3 C)-98.7 F (37.1 C)] 97.4 F (36.3 C) (06/10 0452) Pulse Rate:  [50-78] 50 (06/10 0452) Resp:  [11-20] 18 (06/10 0452) BP: (124-186)/(60-84) 124/60 mmHg (06/10 0452) SpO2:  [95 %-100 %] 99 % (06/10 0452) Last BM Date: 09/06/15  Intake/Output from previous day: 06/09 0701 - 06/10 0700 In: 2913.3 [P.O.:220; I.V.:2693.3] Out: 2280 [Urine:2260; Blood:20] Intake/Output this shift:    Resp: clear to auscultation bilaterally Cardio: regular rate and rhythm GI: soft, minimal tenderness. few bs. incision looks good  Lab Results:   Recent Labs  09/07/15 1859 09/08/15 0444  WBC 8.1 5.8  HGB 12.2* 12.5*  HCT 36.7* 37.5*  PLT 211 205   BMET  Recent Labs  09/07/15 0427 09/08/15 0444  NA 142 140  K 4.2 3.6  CL 111 106  CO2 27 29  GLUCOSE 98 111*  BUN 11 9  CREATININE 0.84 0.82  CALCIUM 8.6* 8.4*   PT/INR  Recent Labs  09/07/15 0427 09/08/15 0444  LABPROT 18.8* 20.0*  INR 1.57* 1.71*   ABG No results for input(s): PHART, HCO3 in the last 72 hours.  Invalid input(s): PCO2, PO2  Studies/Results: No results found.  Anti-infectives: Anti-infectives    Start     Dose/Rate Route Frequency Ordered Stop   09/07/15 2000  cefoTEtan (CEFOTAN) 2 g in dextrose 5 % 50 mL IVPB     2 g 100 mL/hr over 30 Minutes Intravenous Every 12 hours 09/07/15 1049 09/07/15 2025   09/07/15 0600  cefoTEtan (CEFOTAN) 2 g in dextrose 5 % 50 mL IVPB     2 g 100 mL/hr over 30 Minutes Intravenous On call to O.R. 09/06/15 0940 09/07/15 0815   09/06/15 1400  neomycin (MYCIFRADIN) tablet 1,000 mg     1,000 mg Oral 3 times per day 09/06/15 0940 09/06/15 2228   09/06/15 1400  metroNIDAZOLE (FLAGYL) tablet 1,000 mg     1,000 mg Oral 3 times per day 09/06/15 0940 09/06/15 2228      Assessment/Plan: s/p Procedure(s): OPEN SIGMOID COLECTOMY WITH ANASTAMOSIS  (N/A) Continue ice chips for now  Add IV acetaminophen. Pt does not want morphine unless necessary OOB POD 1  LOS: 4 days    TOTH III,Jamicheal Heard S 09/08/2015

## 2015-09-08 NOTE — Progress Notes (Signed)
PROGRESS NOTE    Ian Sullivan.  [id number] DOB: 1927-09-09 DOA: 09/03/2015 PCP: [name], MD     Brief Narrative:   Patient with history of HTN and Afib presented to the ED with 1 week history of abdominal pain and distension. Abdominal imaging showed sigmoid volvulus. GI and surgery consulted.  Volvulus has been reduced by flex sigmoidoscopy decompression. Patient moving forward with elective partial colectomy for definitive treatment. Cleared for surgery per Cardiology. On Miralax for colon prep, currently still with brown stools, although clearing. Continue prep for clear BM's. Currently planning for OR tomorrow.   Assessment & Plan:   Principal Problem:   S/P sigmoid colectomy June 2017 Active Problems:   Essential hypertension   ATRIAL FIBRILLATION   Sigmoid volvulus (HCC)   Chronic constipation   Hypokalemia   1. Sigmoid vovulus - Showing clinical improvement with gastric decompression and conservative approach. -Gen. surgery felt that he was at high risk for recurrence for which partial colectomy was recommended. -On 09/07/2015 years taken to the operating room where he underwent open sigmoid colectomy with anastomosis.  -On 09/08/2015 patient remained stable. He was assisted out of bed and ambulated down the hallway. Did well. Surgery following, plan to keep them on a chips for now  2. Hypokalemia - Received potassium supplementation during this hospitalization  3. Atrial Fibrillation  -Plan to restart warfarin when cleared by surgery  4.  Chronic diastolic congestive heart failure -Last echo performed in August 2016 showed preserved ejection fraction 55-60% -Currently appears euvolemic, Lasix held as he presented with volvulus with placement of NG tube. -Patient having little by mouth intake today, will continue holding Lasix  5.  Surgical clearance. - Patient cleared for surgery per Cardiology. - He has a history of chronic atrial fibrillation who had  been anticoagulated with warfarin. His last transthoracic echocardiogram was performed on 11/21/2014 that revealed an EF of 55-60%. He does not appear to be having acute cardiopulmonary issues at this time.  DVT prophylaxis: Cutaneous heparin Code Status: Full Family Communication: I spoke to family members at bedside Disposition Plan: Home when medically stable   Consultants:   Cardiology  GI  Surgery   Procedures:    None  Antimicrobials:     None   Subjective: He is awake and alert this morning, states doing well, was assisted out of bed to chair  Objective: Filed Vitals:   09/07/15 1418 09/07/15 2122 09/08/15 0154 09/08/15 0452  BP: 160/84 137/61 162/68 124/60  Pulse: 78 53 52 50  Temp: 97.5 F (36.4 C) 98.4 F (36.9 C) 98.6 F (37 C) 97.4 F (36.3 C)  TempSrc: Oral Oral Oral   Resp: 18 18 18 18   Height:      Weight:      SpO2: 100% 99% 99% 99%    Intake/Output Summary (Last 24 hours) at 09/08/15 1211 Last data filed at 09/08/15 0944  Gross per 24 hour  Intake 2113.33 ml  Output   2030 ml  Net  83.33 ml   Filed Weights   09/04/15 0349  Weight: 78.7 kg (173 lb 8 oz)    Examination:  General exam: He is awake and alert Respiratory system: Clear to auscultation. Respiratory effort normal. Cardiovascular system: S1 & S2 heard, RRR. No JVD, murmurs, rubs, gallops or clicks. No pedal edema. Gastrointestinal system: Mid abdominal surgical incision site appears clean and evidence of infection Central nervous system: Alert and oriented. No focal neurological deficits. Extremities: Symmetric 5 x 5 power.  Skin: No rashes, lesions or ulcers Psychiatry: He is sedated from anesthesia    Data Reviewed: I have personally reviewed following labs and imaging studies  CBC:  Recent Labs Lab 09/03/15 1415 09/04/15 0456 09/06/15 0505 09/07/15 0427 09/07/15 1859 09/08/15 0444  WBC 8.9 7.2 6.1 5.4 8.1 5.8  NEUTROABS 6.8  --   --  3.2  --   --   HGB 15.0  13.6 12.6* 12.4* 12.2* 12.5*  HCT 42.6 39.5 36.8* 37.0* 36.7* 37.5*  MCV 92.4 95.2 93.6 96.9 97.6 97.7  PLT 203 189 178 197 211 [id number]   Basic Metabolic Panel:  Recent Labs Lab 09/04/15 0456 09/05/15 0459 09/06/15 0505 09/07/15 0427 09/08/15 0444  NA 139 140 140 142 140  K 3.0* 3.1* 4.4 4.2 3.6  CL 103 104 108 111 106  CO2 25 28 27 27 29   GLUCOSE 92 113* 101* 98 111*  BUN 16 11 11 11 9   CREATININE 0.61 0.67 0.78 0.84 0.82  CALCIUM 8.4* 8.6* 8.6* 8.6* 8.4*  MG 2.0  --   --   --   --    GFR: Estimated Creatinine Clearance: 68.9 mL/min (by C-G formula based on Cr of 0.82). Liver Function Tests:  Recent Labs Lab 09/03/15 1415 09/07/15 0427  AST 48* 26  ALT 27 21  ALKPHOS 111 74  BILITOT 2.2* 1.2  PROT 8.2* 6.1*  ALBUMIN 4.9 3.5   No results for input(s): LIPASE, AMYLASE in the last 168 hours. No results for input(s): AMMONIA in the last 168 hours. Coagulation Profile:  Recent Labs Lab 09/04/15 0456 09/05/15 0459 09/06/15 0505 09/07/15 0427 09/08/15 0444  INR 3.35* 1.55* 1.34 1.57* 1.71*   Cardiac Enzymes: No results for input(s): CKTOTAL, CKMB, CKMBINDEX, TROPONINI in the last 168 hours. BNP (last 3 results) No results for input(s): PROBNP in the last 8760 hours. HbA1C: No results for input(s): HGBA1C in the last 72 hours. CBG: No results for input(s): GLUCAP in the last 168 hours. Lipid Profile: No results for input(s): CHOL, HDL, LDLCALC, TRIG, CHOLHDL, LDLDIRECT in the last 72 hours. Thyroid Function Tests: No results for input(s): TSH, T4TOTAL, FREET4, T3FREE, THYROIDAB in the last 72 hours. Anemia Panel: No results for input(s): VITAMINB12, FOLATE, FERRITIN, TIBC, IRON, RETICCTPCT in the last 72 hours. Urine analysis:    Component Value Date/Time   COLORURINE YELLOW 12/27/2007 [place] 12/27/2007 1315   LABSPEC 1.021 12/27/2007 1315   PHURINE 6.5 12/27/2007 1315   GLUCOSEU NEGATIVE 12/27/2007 1315   HGBUR NEGATIVE 12/27/2007  1315   BILIRUBINUR NEGATIVE 12/27/2007 1315   KETONESUR NEGATIVE 12/27/2007 1315   PROTEINUR NEGATIVE 12/27/2007 1315   UROBILINOGEN 1.0 12/27/2007 1315   NITRITE NEGATIVE 12/27/2007 1315   LEUKOCYTESUR  12/27/2007 1315    NEGATIVE MICROSCOPIC NOT DONE ON URINES WITH NEGATIVE PROTEIN, BLOOD, LEUKOCYTES, NITRITE, OR GLUCOSE <1000 mg/dL.   Sepsis Labs: @LABRCNTIP (procalcitonin:4,lacticidven:4)  ) Recent Results (from the past 240 hour(s))  Surgical pcr screen     Status: None   Collection Time: 09/07/15  5:04 AM  Result Value Ref Range Status   MRSA, PCR NEGATIVE NEGATIVE Final   Staphylococcus aureus NEGATIVE NEGATIVE Final    Comment:        The Xpert SA Assay (FDA approved for NASAL specimens in patients over 65 years of age), is one component of a comprehensive surveillance program.  Test performance has been validated by [facility] for patients greater than or equal to 85 year old. It is  not intended to diagnose infection nor to guide or monitor treatment.          Radiology Studies: No results found.      Scheduled Meds: . alvimopan  12 mg Oral BID  . heparin subcutaneous  5,000 Units Subcutaneous Q8H   Continuous Infusions: . dextrose 5 % and 0.45 % NaCl with KCl 20 mEq/L 100 mL/hr at 09/08/15 0944     LOS: 4 days    Time spent: 25 mins    Kelvin Cellar, MD   [facility] Pager [phone]  If 7PM-7AM, please contact night-coverage [email or web address] Password [email or web address] 09/08/2015, 12:11 PM

## 2015-09-09 MED ORDER — LOSARTAN POTASSIUM 50 MG PO TABS
100.0000 mg | ORAL_TABLET | Freq: Every day | ORAL | Status: DC
  Administered 2015-09-09 – 2015-09-13 (×5): 100 mg via ORAL
  Filled 2015-09-09 (×5): qty 2

## 2015-09-09 MED ORDER — AMLODIPINE BESYLATE 10 MG PO TABS
10.0000 mg | ORAL_TABLET | Freq: Every day | ORAL | Status: DC
  Administered 2015-09-09 – 2015-09-13 (×5): 10 mg via ORAL
  Filled 2015-09-09 (×5): qty 1

## 2015-09-09 MED ORDER — ACETAMINOPHEN 10 MG/ML IV SOLN
1000.0000 mg | Freq: Once | INTRAVENOUS | Status: AC
  Administered 2015-09-09: 1000 mg via INTRAVENOUS
  Filled 2015-09-09: qty 100

## 2015-09-09 NOTE — Evaluation (Signed)
Occupational Therapy Evaluation Patient Details Name: Ian Sullivan. MRN: [id number] DOB: 02-13-1928 Today's Date: 09/09/2015    History of Present Illness 82 yo admitted with progression abdominal pain and distention.  He was found to have a volvulus that was decompressed and he underwent a partial colectomy 09/07/2015.  Pt reports he has had 3 back surgeries and has lots of therapy before    Clinical Impression   Pt was admitted for the above. At baseline, he is mod I with adls.  He currently needs min guard overall.  He is a little weaker than baseline and had one LOB which he self corrected for during ADLs.  He will benefit from continued OT in acute. Goals are for supervision level overall.    Follow Up Recommendations   (supervision, intermittent vs. 24/7)    Equipment Recommendations  None recommended by OT    Recommendations for Other Services       Precautions / Restrictions Precautions Precautions: Fall Restrictions Weight Bearing Restrictions: No      Mobility Bed Mobility               General bed mobility comments: OOB  Transfers   Equipment used: Rolling walker (2 wheeled) Transfers: Sit to/from Stand Sit to Stand: Supervision;Min guard         General transfer comment: for safety    Balance                                            ADL Overall ADL's : Needs assistance/impaired     Grooming: Wash/dry hands;Wash/dry face;Min guard;Standing   Upper Body Bathing: Min guard;Standing   Lower Body Bathing: Min guard;Sit to/from stand;With adaptive equipment   Upper Body Dressing : Minimal assistance;Standing (IV)   Lower Body Dressing: Min guard;Sit to/from stand   Toilet Transfer: Min guard;Ambulation;Comfort height toilet;RW   Toileting- Water quality scientist and Hygiene: Min guard;Sit to/from stand         General ADL Comments: pt was getting up as I arrived. He had incontinence of bowel and wanted to get to  bathroom.  Performed ADL from mostly standing level.  Pt did have one LOB in standing and was able to self correct  Also walked in the hall after he performed these tasks. Encouraged him to ask RN to walk with him again later.  Pt states he does have some incontinence briefs at home which he can use initially.       Vision     Perception     Praxis      Pertinent Vitals/Pain Pain Assessment: 0-10 Pain Score: 1  Pain Location: incision Pain Descriptors / Indicators: Sore Pain Intervention(s): Limited activity within patient's tolerance;Monitored during session;Repositioned     Hand Dominance     Extremity/Trunk Assessment Upper Extremity Assessment Upper Extremity Assessment: Overall WFL for tasks assessed           Communication Communication Communication: No difficulties   Cognition Arousal/Alertness: Awake/alert Behavior During Therapy: WFL for tasks assessed/performed Overall Cognitive Status: Within Functional Limits for tasks assessed                     General Comments       Exercises       Shoulder Instructions      Home Living Family/patient expects to be discharged to:: Private residence Living Arrangements:  Alone Available Help at Discharge: Family;Friend(s);Available PRN/intermittently               Bathroom Shower/Tub: Walk-in shower   Bathroom Toilet: Handicapped height     Home Equipment: Environmental consultant - 2 wheels;Cane - single point   Additional Comments: has reachers      Prior Functioning/Environment Level of Independence: Independent with assistive device(s)        Comments: uses a straight  cane     OT Diagnosis: Generalized weakness   OT Problem List: Decreased activity tolerance;Impaired balance (sitting and/or standing);Pain   OT Treatment/Interventions: Self-care/ADL training;DME and/or AE instruction;Patient/family education;Balance training    OT Goals(Current goals can be found in the care plan section) Acute  Rehab OT Goals Patient Stated Goal: to go home and to get some help for daily activities  OT Goal Formulation: With patient Time For Goal Achievement: 09/16/15 Potential to Achieve Goals: Good ADL Goals Pt Will Transfer to Toilet: with supervision;ambulating (high commode) Pt Will Perform Tub/Shower Transfer: Shower transfer;with supervision;ambulating;shower seat Additional ADL Goal #1: pt will gather clothes at supervision level and complete ADL without supervision, sit to stand, using reacher as needed  OT Frequency: Min 2X/week   Barriers to D/C:            Co-evaluation              End of Session    Activity Tolerance: Patient tolerated treatment well Patient left: in chair;with call bell/phone within reach;with chair alarm set   Time: [id number] OT Time Calculation (min): 27 min Charges:  OT General Charges $OT Visit: 1 Procedure OT Evaluation $OT Eval Low Complexity: 1 Procedure OT Treatments $Self Care/Home Management : 8-22 mins G-Codes:    Croix Presley 2015/09/16, 12:23 PM Lesle Chris, OTR/L [phone] 09/16/2015

## 2015-09-09 NOTE — Progress Notes (Signed)
2 Days Post-Op  Subjective: No complaints  Objective: Vital signs in last 24 hours: Temp:  [98.2 F (36.8 C)-98.8 F (37.1 C)] 98.8 F (37.1 C) (06/11 0704) Pulse Rate:  [52-57] 57 (06/11 0704) Resp:  [18] 18 (06/11 0704) BP: (145-166)/(63-83) 145/83 mmHg (06/11 0704) SpO2:  [96 %-97 %] 97 % (06/11 0704) Last BM Date: 09/06/15  Intake/Output from previous day: 06/10 0701 - 06/11 0700 In: 1201.7 [I.V.:1201.7] Out: 1920 [Urine:1920] Intake/Output this shift:    Resp: clear to auscultation bilaterally Cardio: regular rate and rhythm GI: soft, nontender. good bs. incision looks good  Lab Results:   Recent Labs  09/07/15 1859 09/08/15 0444  WBC 8.1 5.8  HGB 12.2* 12.5*  HCT 36.7* 37.5*  PLT 211 205   BMET  Recent Labs  09/07/15 0427 09/08/15 0444  NA 142 140  K 4.2 3.6  CL 111 106  CO2 27 29  GLUCOSE 98 111*  BUN 11 9  CREATININE 0.84 0.82  CALCIUM 8.6* 8.4*   PT/INR  Recent Labs  09/07/15 0427 09/08/15 0444  LABPROT 18.8* 20.0*  INR 1.57* 1.71*   ABG No results for input(s): PHART, HCO3 in the last 72 hours.  Invalid input(s): PCO2, PO2  Studies/Results: No results found.  Anti-infectives: Anti-infectives    Start     Dose/Rate Route Frequency Ordered Stop   09/07/15 2000  cefoTEtan (CEFOTAN) 2 g in dextrose 5 % 50 mL IVPB     2 g 100 mL/hr over 30 Minutes Intravenous Every 12 hours 09/07/15 1049 09/07/15 2025   09/07/15 0600  cefoTEtan (CEFOTAN) 2 g in dextrose 5 % 50 mL IVPB     2 g 100 mL/hr over 30 Minutes Intravenous On call to O.R. 09/06/15 0940 09/07/15 0815   09/06/15 1400  neomycin (MYCIFRADIN) tablet 1,000 mg     1,000 mg Oral 3 times per day 09/06/15 0940 09/06/15 2228   09/06/15 1400  metroNIDAZOLE (FLAGYL) tablet 1,000 mg     1,000 mg Oral 3 times per day 09/06/15 0940 09/06/15 2228      Assessment/Plan: s/p Procedure(s): OPEN SIGMOID COLECTOMY WITH ANASTAMOSIS (N/A) Advance diet. Start clears today ambulate  LOS: 5  days    TOTH III,Kathline Banbury S 09/09/2015

## 2015-09-09 NOTE — Progress Notes (Signed)
Primary cardiologist: Dr. Kirk Ruths  Seen for followup: Atrial fibrillation  Subjective:    States that he had a good day yesterday. Still eating ice chips, diet has not been advanced.  Objective:   Temp:  [98.2 F (36.8 C)-98.8 F (37.1 C)] 98.8 F (37.1 C) (06/11 0704) Pulse Rate:  [52-57] 57 (06/11 0704) Resp:  [18] 18 (06/11 0704) BP: (145-166)/(63-83) 145/83 mmHg (06/11 0704) SpO2:  [96 %-97 %] 97 % (06/11 0704) Last BM Date: 09/06/15  Filed Weights   09/04/15 0349  Weight: 173 lb 8 oz (78.7 kg)    Intake/Output Summary (Last 24 hours) at 09/09/15 0754 Last data filed at 09/09/15 0700  Gross per 24 hour  Intake 1201.67 ml  Output   1920 ml  Net -718.33 ml    Telemetry: Currently not on telemetry.  Exam:  General: Elderly male in no distress.  Lungs: Clear, nonlabored.  Cardiac: Irregularly irregular, no gallop.  Abdomen: Decreased bowel sounds.  Extremities: No pitting edema.  Lab Results:  Basic Metabolic Panel:  Recent Labs Lab 09/04/15 0456  09/06/15 0505 09/07/15 0427 09/08/15 0444  NA 139  < > 140 142 140  K 3.0*  < > 4.4 4.2 3.6  CL 103  < > 108 111 106  CO2 25  < > 27 27 29   GLUCOSE 92  < > 101* 98 111*  BUN 16  < > 11 11 9   CREATININE 0.61  < > 0.78 0.84 0.82  CALCIUM 8.4*  < > 8.6* 8.6* 8.4*  MG 2.0  --   --   --   --   < > = values in this interval not displayed.   CBC:  Recent Labs Lab 09/07/15 0427 09/07/15 1859 09/08/15 0444  WBC 5.4 8.1 5.8  HGB 12.4* 12.2* 12.5*  HCT 37.0* 36.7* 37.5*  MCV 96.9 97.6 97.7  PLT 197 211 205    Coagulation:  Recent Labs Lab 09/06/15 0505 09/07/15 0427 09/08/15 0444  INR 1.34 1.57* 1.71*   Echocardiogram 11/21/2014: Study Conclusions  - Left ventricle: The cavity size was normal. There was mild  concentric hypertrophy. Systolic function was normal. The  estimated ejection fraction was in the range of 55% to 60%. Wall  motion was normal; there were no regional  wall motion  abnormalities. The study is not technically sufficient to allow  evaluation of LV diastolic function. Doppler parameters are  consistent with elevated ventricular end-diastolic filling  pressure. - Aortic valve: There was mild regurgitation. - Mitral valve: Calcified annulus. There was mild regurgitation. - Left atrium: The atrium was severely dilated. - Right ventricle: The cavity size was mildly dilated. Wall  thickness was normal. Systolic function was mildly reduced. - Right atrium: The atrium was moderately dilated. - Tricuspid valve: There was moderate regurgitation.  ECG 09/08/2015: Atrial fibrillation with right bundle branch block and left axis/LAFB.  Medications:   Scheduled Medications: . alvimopan  12 mg Oral BID  . heparin subcutaneous  5,000 Units Subcutaneous Q8H    Infusions: . dextrose 5 % and 0.45 % NaCl with KCl 20 mEq/L 100 mL/hr at 09/09/15 0512    PRN Medications: acetaminophen, diphenhydrAMINE **OR** diphenhydrAMINE, HYDROcodone-acetaminophen, morphine injection, ondansetron **OR** ondansetron (ZOFRAN) IV   Assessment:   1. Chronic atrial fibrillation, typically on Coumadin as an outpatient but not requiring any specific heart rate control medications.  2. SBO secondary to sigmoid volvulus now status post sigmoid colectomy with anastomosis on June 9. Coumadin remains  on hold. He is still not taking any oral medications as yet.   Plan/Discussion:    Postoperative ECG is stable. Continue observation. As he is able to advance POs, can work back toward his outpatient regimen including Coumadin.   Satira Sark, M.D., F.A.C.C.

## 2015-09-09 NOTE — Progress Notes (Signed)
PROGRESS NOTE    Ian Sullivan.  [id number] DOB: 03-08-1928 DOA: 09/03/2015 PCP: [name], MD     Brief Narrative:   Patient with history of HTN and Afib presented to the ED with 1 week history of abdominal pain and distension. Abdominal imaging showed sigmoid volvulus. GI and surgery consulted.  Volvulus has been reduced by flex sigmoidoscopy decompression. Patient moving forward with elective partial colectomy for definitive treatment. Cleared for surgery per Cardiology. On Miralax for colon prep, currently still with brown stools, although clearing. Continue prep for clear BM's. Currently planning for OR tomorrow.   Assessment & Plan:   Principal Problem:   S/P sigmoid colectomy June 2017 Active Problems:   Essential hypertension   ATRIAL FIBRILLATION   Sigmoid volvulus (HCC)   Chronic constipation   Hypokalemia   1. Sigmoid vovulus - Showing clinical improvement with gastric decompression and conservative approach. -Gen. surgery felt that he was at high risk for recurrence for which partial colectomy was recommended. -On 09/07/2015 years taken to the operating room where he underwent open sigmoid colectomy with anastomosis.  -On 09/08/2015 patient remained stable. He was assisted out of bed and ambulated down the hallway. Did well. Surgery following -On 09/09/2015 his diet was advanced to clears  2. Hypokalemia - Received potassium supplementation during this hospitalization  3. Atrial Fibrillation  -Plan to restart warfarin when cleared by surgery  4.  Hypertension -SBP's in the 140's to 160's -Will restart his home antihypertensive regimen with Amlodipine and Cozaar.     4.  Chronic diastolic congestive heart failure -Last echo performed in August 2016 showed preserved ejection fraction 55-60% -Currently appears euvolemic, Lasix held as he presented with volvulus -Appears euvolemic.  -Will follow volume status closely  5.  Surgical clearance. - Patient  cleared for surgery per Cardiology. - He has a history of chronic atrial fibrillation who had been anticoagulated with warfarin. His last transthoracic echocardiogram was performed on 11/21/2014 that revealed an EF of 55-60%. He does not appear to be having acute cardiopulmonary issues at this time.  DVT prophylaxis: subcutaneous heparin Code Status: Full Family Communication: I spoke to family members at bedside Disposition Plan: Home when medically stable   Consultants:   Cardiology  GI  Surgery   Procedures:    None  Antimicrobials:     None   Subjective: He is awake and alert, says he's doing well. Denies fevers, chills, nausea or vomiting.   Objective: Filed Vitals:   09/08/15 0452 09/08/15 1323 09/08/15 2158 09/09/15 0704  BP: 124/60 147/76 166/63 145/83  Pulse: 50 56 52 57  Temp: 97.4 F (36.3 C) 98.3 F (36.8 C) 98.2 F (36.8 C) 98.8 F (37.1 C)  TempSrc:  Oral Oral Oral  Resp: 18 18 18 18   Height:      Weight:      SpO2: 99% 96% 97% 97%    Intake/Output Summary (Last 24 hours) at 09/09/15 1217 Last data filed at 09/09/15 1029  Gross per 24 hour  Intake 1201.67 ml  Output   1800 ml  Net -598.33 ml   Filed Weights   09/04/15 0349  Weight: 78.7 kg (173 lb 8 oz)    Examination:  General exam: He is awake and alert Respiratory system: Clear to auscultation. Respiratory effort normal. Cardiovascular system: S1 & S2 heard, RRR. No JVD, murmurs, rubs, gallops or clicks. No pedal edema. Gastrointestinal system: Mid abdominal surgical incision site appears clean and evidence of infection Central nervous system: Alert  and oriented. No focal neurological deficits. Extremities: Symmetric 5 x 5 power. Skin: No rashes, lesions or ulcers Psychiatry: He is sedated from anesthesia    Data Reviewed: I have personally reviewed following labs and imaging studies  CBC:  Recent Labs Lab 09/03/15 1415 09/04/15 0456 09/06/15 0505 09/07/15 0427  09/07/15 1859 09/08/15 0444  WBC 8.9 7.2 6.1 5.4 8.1 5.8  NEUTROABS 6.8  --   --  3.2  --   --   HGB 15.0 13.6 12.6* 12.4* 12.2* 12.5*  HCT 42.6 39.5 36.8* 37.0* 36.7* 37.5*  MCV 92.4 95.2 93.6 96.9 97.6 97.7  PLT 203 189 178 197 211 [id number]   Basic Metabolic Panel:  Recent Labs Lab 09/04/15 0456 09/05/15 0459 09/06/15 0505 09/07/15 0427 09/08/15 0444  NA 139 140 140 142 140  K 3.0* 3.1* 4.4 4.2 3.6  CL 103 104 108 111 106  CO2 25 28 27 27 29   GLUCOSE 92 113* 101* 98 111*  BUN 16 11 11 11 9   CREATININE 0.61 0.67 0.78 0.84 0.82  CALCIUM 8.4* 8.6* 8.6* 8.6* 8.4*  MG 2.0  --   --   --   --    GFR: Estimated Creatinine Clearance: 68.9 mL/min (by C-G formula based on Cr of 0.82). Liver Function Tests:  Recent Labs Lab 09/03/15 1415 09/07/15 0427  AST 48* 26  ALT 27 21  ALKPHOS 111 74  BILITOT 2.2* 1.2  PROT 8.2* 6.1*  ALBUMIN 4.9 3.5   No results for input(s): LIPASE, AMYLASE in the last 168 hours. No results for input(s): AMMONIA in the last 168 hours. Coagulation Profile:  Recent Labs Lab 09/04/15 0456 09/05/15 0459 09/06/15 0505 09/07/15 0427 09/08/15 0444  INR 3.35* 1.55* 1.34 1.57* 1.71*   Cardiac Enzymes: No results for input(s): CKTOTAL, CKMB, CKMBINDEX, TROPONINI in the last 168 hours. BNP (last 3 results) No results for input(s): PROBNP in the last 8760 hours. HbA1C: No results for input(s): HGBA1C in the last 72 hours. CBG: No results for input(s): GLUCAP in the last 168 hours. Lipid Profile: No results for input(s): CHOL, HDL, LDLCALC, TRIG, CHOLHDL, LDLDIRECT in the last 72 hours. Thyroid Function Tests: No results for input(s): TSH, T4TOTAL, FREET4, T3FREE, THYROIDAB in the last 72 hours. Anemia Panel: No results for input(s): VITAMINB12, FOLATE, FERRITIN, TIBC, IRON, RETICCTPCT in the last 72 hours. Urine analysis:    Component Value Date/Time   COLORURINE YELLOW 12/27/2007 [place] 12/27/2007 1315   LABSPEC 1.021  12/27/2007 1315   PHURINE 6.5 12/27/2007 1315   GLUCOSEU NEGATIVE 12/27/2007 1315   HGBUR NEGATIVE 12/27/2007 1315   BILIRUBINUR NEGATIVE 12/27/2007 1315   KETONESUR NEGATIVE 12/27/2007 1315   PROTEINUR NEGATIVE 12/27/2007 1315   UROBILINOGEN 1.0 12/27/2007 1315   NITRITE NEGATIVE 12/27/2007 1315   LEUKOCYTESUR  12/27/2007 1315    NEGATIVE MICROSCOPIC NOT DONE ON URINES WITH NEGATIVE PROTEIN, BLOOD, LEUKOCYTES, NITRITE, OR GLUCOSE <1000 mg/dL.   Sepsis Labs: @LABRCNTIP (procalcitonin:4,lacticidven:4)  ) Recent Results (from the past 240 hour(s))  Surgical pcr screen     Status: None   Collection Time: 09/07/15  5:04 AM  Result Value Ref Range Status   MRSA, PCR NEGATIVE NEGATIVE Final   Staphylococcus aureus NEGATIVE NEGATIVE Final    Comment:        The Xpert SA Assay (FDA approved for NASAL specimens in patients over 21 years of age), is one component of a comprehensive surveillance program.  Test performance has been validated by [facility]  for patients greater than or equal to 80 year old. It is not intended to diagnose infection nor to guide or monitor treatment.          Radiology Studies: No results found.      Scheduled Meds: . alvimopan  12 mg Oral BID  . heparin subcutaneous  5,000 Units Subcutaneous Q8H   Continuous Infusions: . dextrose 5 % and 0.45 % NaCl with KCl 20 mEq/L 50 mL/hr at 09/09/15 0847     LOS: 5 days    Time spent: 25 mins    Kelvin Cellar, MD   [facility] Pager [phone]  If 7PM-7AM, please contact night-coverage [email or web address] Password [facility] 09/09/2015, 12:17 PM

## 2015-09-10 LAB — BASIC METABOLIC PANEL
ANION GAP: 8 (ref 5–15)
BUN: 6 mg/dL (ref 6–20)
CHLORIDE: 104 mmol/L (ref 101–111)
CO2: 29 mmol/L (ref 22–32)
CREATININE: 0.78 mg/dL (ref 0.61–1.24)
Calcium: 9.1 mg/dL (ref 8.9–10.3)
GFR calc non Af Amer: >60 mL/min (ref >60)
Glucose, Bld: 103 mg/dL — ABNORMAL HIGH (ref 65–99)
POTASSIUM: 3.8 mmol/L (ref 3.5–5.1)
SODIUM: 141 mmol/L (ref 135–145)

## 2015-09-10 LAB — CBC
HEMATOCRIT: 39 % (ref 39.0–52.0)
HEMOGLOBIN: 13.2 g/dL (ref 13.0–17.0)
MCH: 32.4 pg (ref 26.0–34.0)
MCHC: 33.8 g/dL (ref 30.0–36.0)
MCV: 95.8 fL (ref 78.0–100.0)
PLATELETS: 254 10*3/uL (ref 150–400)
RBC: 4.07 MIL/uL — AB (ref 4.22–5.81)
RDW: 13.7 % (ref 11.5–15.5)
WBC: 5.6 10*3/uL (ref 4.0–10.5)

## 2015-09-10 NOTE — Progress Notes (Signed)
OT Cancellation Note  Patient Details Name: Ian Sullivan. MRN: [id number] DOB: September 02, 1927   Cancelled Treatment:    Reason Eval/Treat Not Completed: Other (comment).  Checked on pt twice this am.  Had just awoken once and was sleepy and later had a visitor. Will likely check back tomorrow.  Srikar Chiang 09/10/2015, 10:10 AM  Lesle Chris, OTR/L [phone] 09/10/2015

## 2015-09-10 NOTE — Progress Notes (Signed)
Subjective: No chest pain or SOB. Taking clear liquids.    Objective: Vital signs in last 24 hours: Temp:  [98.2 F (36.8 C)-98.6 F (37 C)] 98.2 F (36.8 C) (06/12 0500) Pulse Rate:  [59-67] 67 (06/12 0500) Resp:  [18-20] 18 (06/12 0500) BP: (132-160)/(67-74) 132/74 mmHg (06/12 0500) SpO2:  [96 %-98 %] 98 % (06/12 0500) Weight change:  Last BM Date: 09/06/15 Intake/Output from previous day: 06/11 0701 - 06/12 0700 In: 1775.8 [P.O.:440; I.V.:1235.8; IV Piggyback:100] Out: 1200 [Urine:1200] Intake/Output this shift:    PE: General:Pleasant affect, NAD Skin:Warm and dry, brisk capillary refill HEENT:normocephalic, sclera clear, mucus membranes moist Neck:supple, no JVD Heart:irreg irreg but controlled without murmur, gallup, rub or click Lungs:clear without rales, rhonchi, or wheezes [id number], non tender, + BS, do not palpate liver spleen or masses Ext:no lower ext edema, 2+ pedal pulses, 2+ radial pulses, SCDs in place Neuro:alert and oriented X 3, MAE, follows commands, + facial symmetry   Tele:  Previously on tele, A fib rate controlled now d/c'd  Lab Results:  Recent Labs  09/08/15 0444 09/10/15 0423  WBC 5.8 5.6  HGB 12.5* 13.2  HCT 37.5* 39.0  PLT 205 254   BMET  Recent Labs  09/08/15 0444 09/10/15 0423  NA 140 141  K 3.6 3.8  CL 106 104  CO2 29 29  GLUCOSE 111* 103*  BUN 9 6  CREATININE 0.82 0.78  CALCIUM 8.4* 9.1   No results for input(s): TROPONINI in the last 72 hours.  Invalid input(s): CK, MB  No results found for: CHOL, HDL, LDLCALC, LDLDIRECT, TRIG, CHOLHDL No results found for: HGBA1C   Lab Results  Component Value Date   TSH 0.55 08/16/2007       Studies/Results: No results found.  Medications: I have reviewed the patient's current medications. Scheduled Meds: . alvimopan  12 mg Oral BID  . amLODipine  10 mg Oral Daily  . heparin subcutaneous  5,000 Units Subcutaneous Q8H  . losartan  100 mg Oral Daily    Continuous Infusions: . dextrose 5 % and 0.45 % NaCl with KCl 20 mEq/L 50 mL/hr at 09/09/15 2226   PRN Meds:.acetaminophen, diphenhydrAMINE **OR** diphenhydrAMINE, HYDROcodone-acetaminophen, morphine injection, ondansetron **OR** ondansetron (ZOFRAN) IV  Assessment/Plan: Principal Problem:   S/P sigmoid colectomy June 2017 Active Problems:   Essential hypertension   ATRIAL FIBRILLATION   Sigmoid volvulus (HCC)   Chronic constipation   Hypokalemia  1. Chronic atrial fibrillation, typically on Coumadin as an outpatient but not requiring any specific heart rate control medications. No cardiac complaints.   2. POD # 3 SBO secondary to sigmoid volvulus now status post sigmoid colectomy with anastomosis on June 9. Coumadin remains on hold. He is still not taking any oral medications as yet. To start today, taking clear liquids, passing gas no BM   LOS: 6 days   Time spent with pt. :15 minutes. Cecilie Kicks  Nurse Practitioner Certified Pager [phone] or after 5pm and on weekends call [phone] 09/10/2015, 8:20 AM   History and all data above reviewed.  Patient examined.  I agree with the findings as above.  Feels pretty good.  Mild abdominal pain with movement.  The patient exam reveals [id number]  ,  Lungs: Clear  ,  Abd: Decreased bowel sounds but present, Ext No edema   .  All available labs, radiology testing, previous records reviewed. Agree with documented assessment and plan. Atrial fib:  Resume coumadin when able.  Please call us with further questions.     Jeneen Rinks Kari Kerth  2:54 PM  09/10/2015

## 2015-09-10 NOTE — Progress Notes (Signed)
[facility] [place]:  [phone] General Surgery Progress Note   LOS: 6 days  POD -  3 Days Post-Op  Assessment/Plan: 1.  OPEN SIGMOID COLECTOMY WITH ANASTAMOSIS - 09/07/2015 - D. Delvina Mizzell  For a sigmoid volvulus  On entereg  On clear liquids, no flatus  2.  HTN 3.  A Fib  Seen By Dr. Kirk Ruths  4.  DVT prophylaxis - SQ Heparin   Principal Problem:   S/P sigmoid colectomy June 2017 Active Problems:   Essential hypertension   ATRIAL FIBRILLATION   Sigmoid volvulus (HCC)   Chronic constipation   Hypokalemia   Subjective:  Feels okay.  Feels some rumbling in his abdomen, but no flatus or BM.  Ian Sullivan, in room with him.  Objective:   Filed Vitals:   09/09/15 2153 09/10/15 0500  BP: 160/67 132/74  Pulse: 61 67  Temp: 98.2 F (36.8 C) 98.2 F (36.8 C)  Resp: 18 18     Intake/Output from previous day:  06/11 0701 - 06/12 0700 In: 1775.8 [P.O.:440; I.V.:1235.8; IV Piggyback:100] Out: 1200 [Urine:1200]  Intake/Output this shift:      Physical Exam:   General: Older thin WM who is alert and oriented.    HEENT: Normal. Pupils equal. .   Lungs: Clear   Abdomen: Soft.  Has BS.     Wound looks okay.   Lab Results:    Recent Labs  09/08/15 0444 09/10/15 0423  WBC 5.8 5.6  HGB 12.5* 13.2  HCT 37.5* 39.0  PLT 205 254    BMET   Recent Labs  09/08/15 0444 09/10/15 0423  NA 140 141  K 3.6 3.8  CL 106 104  CO2 29 29  GLUCOSE 111* 103*  BUN 9 6  CREATININE 0.82 0.78  CALCIUM 8.4* 9.1    PT/INR   Recent Labs  09/08/15 0444  LABPROT 20.0*  INR 1.71*    ABG  No results for input(s): PHART, HCO3 in the last 72 hours.  Invalid input(s): PCO2, PO2   Studies/Results:  No results found.   Anti-infectives:   Anti-infectives    Start     Dose/Rate Route Frequency Ordered Stop   09/07/15 2000  cefoTEtan (CEFOTAN) 2 g in dextrose 5 % 50 mL IVPB     2 g 100 mL/hr over 30 Minutes Intravenous Every 12 hours  09/07/15 1049 09/07/15 2025   09/07/15 0600  cefoTEtan (CEFOTAN) 2 g in dextrose 5 % 50 mL IVPB     2 g 100 mL/hr over 30 Minutes Intravenous On call to O.R. 09/06/15 0940 09/07/15 0815   09/06/15 1400  neomycin (MYCIFRADIN) tablet 1,000 mg     1,000 mg Oral 3 times per day 09/06/15 0940 09/06/15 2228   09/06/15 1400  metroNIDAZOLE (FLAGYL) tablet 1,000 mg     1,000 mg Oral 3 times per day 09/06/15 0940 09/06/15 2228      Ian Overall, MD, FACS Pager: [place] [facility]: [phone] 09/10/2015

## 2015-09-10 NOTE — Progress Notes (Signed)
Physical Therapy Treatment Patient Details Name: Ian Sullivan. MRN: [id number] DOB: May 28, 1927 Today's Date: 09/10/2015    History of Present Illness 82 yo admitted with progression abdominal pain and distention.  He was found to have a volvulus that was decompressed and he underwent a partial colectomy 09/07/2015.  Pt reports he has had 3 back surgeries and has lots of PT before     PT Comments    Pt feeling better and highly motivated.  Assisted OOB to amb a great distance in hallway using RW due to recent ABD surgery and safety.  Otherwise, pt able to amvb with his straight cane as prior.    Follow Up Recommendations  No PT follow up     Equipment Recommendations  None recommended by PT    Recommendations for Other Services       Precautions / Restrictions Precautions Precautions: Fall Precaution Comments: recent ABD surgery Restrictions Weight Bearing Restrictions: No    Mobility  Bed Mobility Overal bed mobility: Needs Assistance Bed Mobility: Supine to Sit     Supine to sit: Mod assist     General bed mobility comments: assist for upper body only due to recent ABD surgery  Transfers Overall transfer level: Needs assistance Equipment used: Rolling walker (2 wheeled) Transfers: Sit to/from Stand Sit to Stand: Supervision;Min guard         General transfer comment: one VC on proper hand placement  Ambulation/Gait Ambulation/Gait assistance: Supervision;Min guard Ambulation Distance (Feet): 350 Feet Assistive device: Rolling walker (2 wheeled) Gait Pattern/deviations: Step-through pattern;Decreased stride length Gait velocity: WFL   General Gait Details: for safety and to increase amb distance   [employer] (Stroke Patients Only)       Balance                                    Cognition Arousal/Alertness: Awake/alert Behavior During Therapy: WFL for tasks  assessed/performed Overall Cognitive Status: Within Functional Limits for tasks assessed                      Exercises      General Comments        Pertinent Vitals/Pain Pain Assessment: 0-10 Pain Score: 3  Pain Location: ABD Pain Descriptors / Indicators: Tender Pain Intervention(s): Premedicated before session    Home Living                      Prior Function            PT Goals (current goals can now be found in the care plan section) Progress towards PT goals: Progressing toward goals    Frequency       PT Plan Current plan remains appropriate    Co-evaluation             End of Session Equipment Utilized During Treatment: Gait belt Activity Tolerance: Patient tolerated treatment well Patient left: in chair;with chair alarm set;with family/visitor present     Time: 1132-1201 PT Time Calculation (min) (ACUTE ONLY): 29 min  Charges:  $Gait Training: 8-22 mins $Therapeutic Activity: 8-22 mins                    G Codes:      Laterrica Libman  PTA [employer]  Acute  Rehab Pager      [phone]

## 2015-09-10 NOTE — Progress Notes (Signed)
PROGRESS NOTE    Ian Sullivan.  [id number] DOB: 1928/03/25 DOA: 09/03/2015 PCP: [name], MD     Brief Narrative:   Patient with history of HTN and Afib presented to the ED with 1 week history of abdominal pain and distension. Abdominal imaging showed sigmoid volvulus. GI and surgery consulted.  Volvulus has been reduced by flex sigmoidoscopy decompression. Patient moving forward with elective partial colectomy for definitive treatment. Cleared for surgery per Cardiology. On Miralax for colon prep, currently still with brown stools, although clearing. Continue prep for clear BM's. Currently planning for OR tomorrow.   Assessment & Plan:   Principal Problem:   S/P sigmoid colectomy June 2017 Active Problems:   Essential hypertension   ATRIAL FIBRILLATION   Sigmoid volvulus (HCC)   Chronic constipation   Hypokalemia   1. Sigmoid vovulus - Showing clinical improvement with gastric decompression and conservative approach. -Gen. surgery felt that he was at high risk for recurrence for which partial colectomy was recommended. -On 09/07/2015 years taken to the operating room where he underwent open sigmoid colectomy with anastomosis.  -On 09/08/2015 patient remained stable. He was assisted out of bed and ambulated down the hallway. Did well. Surgery following -On 09/09/2015 his diet was advanced to clears  2. Hypokalemia - Received potassium supplementation during this hospitalization  3. Atrial Fibrillation  -Plan to restart warfarin when cleared by surgery  4.  Hypertension -SBP's in the 140's to 160's -Will restart his home antihypertensive regimen with Amlodipine and Cozaar.     4.  Chronic diastolic congestive heart failure -Last echo performed in August 2016 showed preserved ejection fraction 55-60% -Currently appears euvolemic, Lasix held as he presented with volvulus -Appears euvolemic.  -Will follow volume status closely  5.  Surgical clearance. - Patient  cleared for surgery per Cardiology. - He has a history of chronic atrial fibrillation who had been anticoagulated with warfarin. His last transthoracic echocardiogram was performed on 11/21/2014 that revealed an EF of 55-60%. He does not appear to be having acute cardiopulmonary issues at this time.  DVT prophylaxis: subcutaneous heparin Code Status: Full Family Communication: I spoke to family members at bedside Disposition Plan: Home when medically stable   Consultants:   Cardiology  GI  Surgery   Procedures:    None  Antimicrobials:     None   Subjective: He is awake and alert, says he's doing well. Denies fevers, chills, nausea or vomiting.   Objective: Filed Vitals:   09/09/15 1450 09/09/15 2153 09/10/15 0500 09/10/15 1109  BP: 143/72 160/67 132/74 140/64  Pulse: 59 61 67   Temp: 98.6 F (37 C) 98.2 F (36.8 C) 98.2 F (36.8 C)   TempSrc: Oral Oral Oral   Resp: 20 18 18    Height:      Weight:      SpO2: 96% 96% 98%     Intake/Output Summary (Last 24 hours) at 09/10/15 1351 Last data filed at 09/10/15 1111  Gross per 24 hour  Intake 1555.83 ml  Output   1150 ml  Net 405.83 ml   Filed Weights   09/04/15 0349  Weight: 78.7 kg (173 lb 8 oz)    Examination:  General exam: He is awake and alert Respiratory system: Clear to auscultation. Respiratory effort normal. Cardiovascular system: S1 & S2 heard, RRR. No JVD, murmurs, rubs, gallops or clicks. No pedal edema. Gastrointestinal system: Mid abdominal surgical incision site appears clean and evidence of infection Central nervous system: Alert and oriented. No  focal neurological deficits. Extremities: Symmetric 5 x 5 power. Skin: No rashes, lesions or ulcers Psychiatry: He is sedated from anesthesia    Data Reviewed: I have personally reviewed following labs and imaging studies  CBC:  Recent Labs Lab 09/03/15 1415  09/06/15 0505 09/07/15 0427 09/07/15 1859 09/08/15 0444 09/10/15 0423  WBC  8.9  < > 6.1 5.4 8.1 5.8 5.6  NEUTROABS 6.8  --   --  3.2  --   --   --   HGB 15.0  < > 12.6* 12.4* 12.2* 12.5* 13.2  HCT 42.6  < > 36.8* 37.0* 36.7* 37.5* 39.0  MCV 92.4  < > 93.6 96.9 97.6 97.7 95.8  PLT 203  < > 178 197 211 205 254  < > = values in this interval not displayed. Basic Metabolic Panel:  Recent Labs Lab 09/04/15 0456 09/05/15 0459 09/06/15 0505 09/07/15 0427 09/08/15 0444 09/10/15 0423  NA 139 140 140 142 140 141  K 3.0* 3.1* 4.4 4.2 3.6 3.8  CL 103 104 108 111 106 104  CO2 25 28 27 27 29 29   GLUCOSE 92 113* 101* 98 111* 103*  BUN 16 11 11 11 9 6   CREATININE 0.61 0.67 0.78 0.84 0.82 0.78  CALCIUM 8.4* 8.6* 8.6* 8.6* 8.4* 9.1  MG 2.0  --   --   --   --   --    GFR: Estimated Creatinine Clearance: 70.6 mL/min (by C-G formula based on Cr of 0.78). Liver Function Tests:  Recent Labs Lab 09/03/15 1415 09/07/15 0427  AST 48* 26  ALT 27 21  ALKPHOS 111 74  BILITOT 2.2* 1.2  PROT 8.2* 6.1*  ALBUMIN 4.9 3.5   No results for input(s): LIPASE, AMYLASE in the last 168 hours. No results for input(s): AMMONIA in the last 168 hours. Coagulation Profile:  Recent Labs Lab 09/04/15 0456 09/05/15 0459 09/06/15 0505 09/07/15 0427 09/08/15 0444  INR 3.35* 1.55* 1.34 1.57* 1.71*   Cardiac Enzymes: No results for input(s): CKTOTAL, CKMB, CKMBINDEX, TROPONINI in the last 168 hours. BNP (last 3 results) No results for input(s): PROBNP in the last 8760 hours. HbA1C: No results for input(s): HGBA1C in the last 72 hours. CBG: No results for input(s): GLUCAP in the last 168 hours. Lipid Profile: No results for input(s): CHOL, HDL, LDLCALC, TRIG, CHOLHDL, LDLDIRECT in the last 72 hours. Thyroid Function Tests: No results for input(s): TSH, T4TOTAL, FREET4, T3FREE, THYROIDAB in the last 72 hours. Anemia Panel: No results for input(s): VITAMINB12, FOLATE, FERRITIN, TIBC, IRON, RETICCTPCT in the last 72 hours. Urine analysis:    Component Value Date/Time    COLORURINE YELLOW 12/27/2007 [place] 12/27/2007 1315   LABSPEC 1.021 12/27/2007 1315   PHURINE 6.5 12/27/2007 1315   GLUCOSEU NEGATIVE 12/27/2007 1315   HGBUR NEGATIVE 12/27/2007 1315   BILIRUBINUR NEGATIVE 12/27/2007 1315   KETONESUR NEGATIVE 12/27/2007 1315   PROTEINUR NEGATIVE 12/27/2007 1315   UROBILINOGEN 1.0 12/27/2007 1315   NITRITE NEGATIVE 12/27/2007 1315   LEUKOCYTESUR  12/27/2007 1315    NEGATIVE MICROSCOPIC NOT DONE ON URINES WITH NEGATIVE PROTEIN, BLOOD, LEUKOCYTES, NITRITE, OR GLUCOSE <1000 mg/dL.   Sepsis Labs: @LABRCNTIP (procalcitonin:4,lacticidven:4)  ) Recent Results (from the past 240 hour(s))  Surgical pcr screen     Status: None   Collection Time: 09/07/15  5:04 AM  Result Value Ref Range Status   MRSA, PCR NEGATIVE NEGATIVE Final   Staphylococcus aureus NEGATIVE NEGATIVE Final    Comment:  The Xpert SA Assay (FDA approved for NASAL specimens in patients over 71 years of age), is one component of a comprehensive surveillance program.  Test performance has been validated by [facility] for patients greater than or equal to 76 year old. It is not intended to diagnose infection nor to guide or monitor treatment.          Radiology Studies: No results found.      Scheduled Meds: . alvimopan  12 mg Oral BID  . amLODipine  10 mg Oral Daily  . heparin subcutaneous  5,000 Units Subcutaneous Q8H  . losartan  100 mg Oral Daily   Continuous Infusions: . dextrose 5 % and 0.45 % NaCl with KCl 20 mEq/L 50 mL/hr at 09/09/15 2226     LOS: 6 days    Time spent: 25 mins    Kelvin Cellar, MD   [facility] Pager [phone]  If 7PM-7AM, please contact night-coverage [email or web address] Password [facility] 09/10/2015, 1:51 PM   Addendum  I personally evaluated Mr. Tantillo on 09/10/2015 agree with above findings. Mr. Matheson is doing well, states tolerating clears as far. He has not had a bowel movement nor has he passed  any flatus. He ambulated down the hallway with physical therapy. Lab work remained stable. General surgery recommending continuing clear liquid diet for now. On physical examination surgical incision site appears clean. Abdomen is nondistended without tenderness to palpation other than over her surgical incision site. Continue following surgery's lead.

## 2015-09-10 NOTE — Care Management Important Message (Signed)
Important Message  Patient Details  Name: Ian Sullivan. MRN: [id number] Date of Birth: 02-25-1928   Medicare Important Message Given:  Yes    Camillo Flaming 09/10/2015, 10:21 AMImportant Message  Patient Details  Name: Ian Sullivan. MRN: [id number] Date of Birth: 04-Apr-1927   Medicare Important Message Given:  Yes    Camillo Flaming 09/10/2015, 10:21 AM

## 2015-09-10 NOTE — Op Note (Signed)
NAME:  PURVIS, CROSWELL NO.:  MEDICAL RECORD NO.:  [id number]  LOCATION:                                 FACILITY:  PHYSICIAN:  Isabel Caprice. Hassell Done, MD       DATE OF BIRTH:  DATE OF PROCEDURE: DATE OF DISCHARGE:                              OPERATIVE REPORT   PREOPERATIVE INDICATIONS:  This is an 82 year old white male with a sigmoid volvulus.  He has a history of osteoporosis with a nerve stimulator in place and the lower extremity neuropathy.  He presented to the hospital with a sigmoid volvulus and was successfully decompressed and had a bowel prep.  PROCEDURE:  Open sigmoid colectomy with primary anastomosis.  SURGEON:  Isabel Caprice. Hassell Done, MD  ASSISTANT:  None.  ANESTHESIA:  General endotracheal.  DRAINS:  None.  DESCRIPTION OF PROCEDURE:  The patient was taken to OR 4 at [facility], given general anesthesia.  The abdomen was prepped with ChloraPrep and draped sterilely.  A small midline incision was made just below the umbilicus about 9 cm long and wound protector was inserted.  The colon was identified and was easily brought out onto the abdomen and was very redundant.  I traced the distal limb, which was more dilated down into the pelvis in the proximal end, which was decompressed.  The mesentery was thickened and had fibrotic changes consistent with a recurrent twisting.  I mapped out proximal distal anastomosis with no tension and thereby creating a U of resected colon about a foot or more in length. The bowel was divided with black load Covidien Endo-GIA with x2 on the dilated distal segment and 1 purple load across the proximal colon. These were lying along the antimesenteric borders.  I went in just a little ways and put in a tacking suture created otomies and inserted an Endo-GIA 6 cm purple load.  This was lying nicely and it was fired creating the common channel.  There was no bleeding noted.  The common opening was closed from either  end with running canal 4-0 PDS.  A second layer of 3-0 silk seromuscular Lembert sutures were placed to complete the closure.  A crotch stitch of 3-0 silk was also used.  The mesentery, when resecting this redundant segment, was transected with the bipolar surgical device and 1 artery in the base was oversewn with U-stitch of 2- 0 silk.  The defect was closed with a running 3-0 Vicryl up to the anastomosis.  No bleeding was noted.  We followed the colon protocol in terms of wound management and removed the wound retractor, changed gowns and gloves.  We irrigated with 1 L of saline.  There was essentially no spillage whatsoever.  Everything looked to be in order.  The incision was closed from either end with double-stranded #1 PDS and with strands tied separately.  The fascia was injected with Exparel diluted to 30 mL and then 4-0 Vicryl was used in the subcu and then the wound was closed with a stapler.  The patient tolerated the procedure well and was taken to the recovery room in satisfactory condition.  Of note, the patient's INR  was a little bit elevated, but again bleeding appeared to be controlled.     Isabel Caprice Hassell Done, MD     MBM/MEDQ  D:  09/07/2015  T:  09/08/2015  Job:  [id number]

## 2015-09-11 NOTE — Progress Notes (Signed)
BM X1 this am. Pt with hyperactive bowel sounds and passing gas. OOB sitting in chair this afternoon/tolerating well Physical therapy in to see pt.

## 2015-09-11 NOTE — Progress Notes (Signed)
Pharmacy Brief Note - Alvimopan (Entereg)  The standing order set for alvimopan (Entereg) now includes an automatic order to discontinue the drug after the patient has had a bowel movement. The change was approved by the [place] and the Medical Executive Committee.  This patient has had a bowel movement documented by nursing. Therefore, alvimopan has been discontinued. If there are questions, please contact the pharmacy at [phone].  Thank you   Reuel Boom, PharmD, BCPS Pager: [phone] 09/11/2015, 1:56 PM

## 2015-09-11 NOTE — Progress Notes (Signed)
PROGRESS NOTE    Judie Bonus.  [id number] DOB: Feb 19, 1928 DOA: 09/03/2015 PCP: [name], MD     Brief Narrative:   Patient with history of HTN and Afib presented to the ED on 09/04/2015 with 1 week history of abdominal pain and distension. Abdominal imaging showed sigmoid volvulus. GI and surgery consulted.  Volvulus has been reduced by flex sigmoidoscopy decompression. Patient moving forward with elective partial colectomy for definitive treatment. Cleared for surgery per Cardiology. Miralax for colon prep. Had surgery on 09/07/2015. Open sigmoid colectomy with anastamosis. Recovery has been uneventful.    Assessment & Plan:   Principal Problem:   S/P sigmoid colectomy June 2017 Active Problems:   Essential hypertension   ATRIAL FIBRILLATION   Sigmoid volvulus (HCC)   Chronic constipation   Hypokalemia   1. Sigmoid vovulus - Showing clinical improvement with gastric decompression and conservative approach. - Gen. surgery felt that he was at high risk for recurrence for which partial colectomy was recommended. - On 09/07/2015 years taken to the operating room where he underwent open sigmoid colectomy with anastomosis.  - On 09/08/2015 patient remained stable. He was assisted out of bed and ambulated down the hallway. Did well. Surgery following - On 09/09/2015 his diet was advanced to clears - On 09/11/2015 he has had 2 bowel movements and is passing flatus. Overall showing excellent recovery. His diet was further advanced on this date.  2. Hypokalemia - Received potassium supplementation during this hospitalization -Labs on 09/10/2015 showing stable potassium of 3.8  3. Atrial Fibrillation  -Plan to restart warfarin when cleared by surgery  4.  Hypertension - SBP's in the 140's to 160's - Continue Amlodipine 10 mg by mouth daily and Cozaar 100 mg by mouth daily.    -His last blood pressure was 120/87  4.  Chronic diastolic congestive heart failure -Last echo  performed in August 2016 showed preserved ejection fraction 55-60% -Currently appears euvolemic, Lasix held as he presented with volvulus -Appears euvolemic.  -Will follow volume status closely  5.  Surgical clearance. - Patient cleared for surgery per Cardiology. - He has a history of chronic atrial fibrillation who had been anticoagulated with warfarin. His last transthoracic echocardiogram was performed on 11/21/2014 that revealed an EF of 55-60%. He does not appear to be having acute cardiopulmonary issues at this time. -Plan to restart his warfarin when OK with general surgery  DVT prophylaxis: subcutaneous heparin Code Status: Full Family Communication: I spoke to family members at bedside Disposition Plan: Home when medically stable, patient overall doing well at think he'll likely go home in the next 24-48 hours   Consultants:   Cardiology  GI  Surgery   Procedures:    None  Antimicrobials:     None   Subjective: Sit in a bedside chair, reporting having 2 bowel movements today, passing flatus. Tolerating clears  Objective: Filed Vitals:   09/10/15 1426 09/10/15 2144 09/11/15 0124 09/11/15 0655  BP: 134/84 155/92 138/97 166/99  Pulse: 64 74 64 67  Temp: 98.3 F (36.8 C) 99.5 F (37.5 C) 98.7 F (37.1 C) 97.8 F (36.6 C)  TempSrc: Oral Oral Oral Oral  Resp: 18 18 16 16   Height:      Weight:      SpO2: 98% 97% 97% 99%    Intake/Output Summary (Last 24 hours) at 09/11/15 0812 Last data filed at 09/11/15 0657  Gross per 24 hour  Intake    790 ml  Output   2700 ml  Net  -1910 ml   Filed Weights   09/04/15 0349  Weight: 78.7 kg (173 lb 8 oz)    Examination:  General exam: He is awake and alert Respiratory system: Clear to auscultation. Respiratory effort normal. Cardiovascular system: S1 & S2 heard, RRR. No JVD, murmurs, rubs, gallops or clicks. No pedal edema. Gastrointestinal system: Mid abdominal surgical incision site appears clean and no  evidence of infection. Normoactive bowel sounds  Central nervous system: Alert and oriented. No focal neurological deficits. Extremities: Symmetric 5 x 5 power. Skin: No rashes, lesions or ulcers Psychiatry: Judgement and insight appear normal. Mood & affect appropriate.     Data Reviewed: I have personally reviewed following labs and imaging studies  CBC:  Recent Labs Lab 09/06/15 0505 09/07/15 0427 09/07/15 1859 09/08/15 0444 09/10/15 0423  WBC 6.1 5.4 8.1 5.8 5.6  NEUTROABS  --  3.2  --   --   --   HGB 12.6* 12.4* 12.2* 12.5* 13.2  HCT 36.8* 37.0* 36.7* 37.5* 39.0  MCV 93.6 96.9 97.6 97.7 95.8  PLT 178 197 211 205 0000000   Basic Metabolic Panel:  Recent Labs Lab 09/05/15 0459 09/06/15 0505 09/07/15 0427 09/08/15 0444 09/10/15 0423  NA 140 140 142 140 141  K 3.1* 4.4 4.2 3.6 3.8  CL 104 108 111 106 104  CO2 28 27 27 29 29   GLUCOSE 113* 101* 98 111* 103*  BUN 11 11 11 9 6   CREATININE 0.67 0.78 0.84 0.82 0.78  CALCIUM 8.6* 8.6* 8.6* 8.4* 9.1   GFR: Estimated Creatinine Clearance: 70.6 mL/min (by C-G formula based on Cr of 0.78). Liver Function Tests:  Recent Labs Lab 09/07/15 0427  AST 26  ALT 21  ALKPHOS 74  BILITOT 1.2  PROT 6.1*  ALBUMIN 3.5   No results for input(s): LIPASE, AMYLASE in the last 168 hours. No results for input(s): AMMONIA in the last 168 hours. Coagulation Profile:  Recent Labs Lab 09/05/15 0459 09/06/15 0505 09/07/15 0427 09/08/15 0444  INR 1.55* 1.34 1.57* 1.71*   Cardiac Enzymes: No results for input(s): CKTOTAL, CKMB, CKMBINDEX, TROPONINI in the last 168 hours. BNP (last 3 results) No results for input(s): PROBNP in the last 8760 hours. HbA1C: No results for input(s): HGBA1C in the last 72 hours. CBG: No results for input(s): GLUCAP in the last 168 hours. Lipid Profile: No results for input(s): CHOL, HDL, LDLCALC, TRIG, CHOLHDL, LDLDIRECT in the last 72 hours. Thyroid Function Tests: No results for input(s): TSH,  T4TOTAL, FREET4, T3FREE, THYROIDAB in the last 72 hours. Anemia Panel: No results for input(s): VITAMINB12, FOLATE, FERRITIN, TIBC, IRON, RETICCTPCT in the last 72 hours. Urine analysis:    Component Value Date/Time   COLORURINE YELLOW 12/27/2007 [place] 12/27/2007 1315   LABSPEC 1.021 12/27/2007 1315   PHURINE 6.5 12/27/2007 1315   GLUCOSEU NEGATIVE 12/27/2007 1315   HGBUR NEGATIVE 12/27/2007 1315   BILIRUBINUR NEGATIVE 12/27/2007 1315   KETONESUR NEGATIVE 12/27/2007 1315   PROTEINUR NEGATIVE 12/27/2007 1315   UROBILINOGEN 1.0 12/27/2007 1315   NITRITE NEGATIVE 12/27/2007 1315   LEUKOCYTESUR  12/27/2007 1315    NEGATIVE MICROSCOPIC NOT DONE ON URINES WITH NEGATIVE PROTEIN, BLOOD, LEUKOCYTES, NITRITE, OR GLUCOSE <1000 mg/dL.   Sepsis Labs: @LABRCNTIP (procalcitonin:4,lacticidven:4)  ) Recent Results (from the past 240 hour(s))  Surgical pcr screen     Status: None   Collection Time: 09/07/15  5:04 AM  Result Value Ref Range Status   MRSA, PCR NEGATIVE NEGATIVE Final  Staphylococcus aureus NEGATIVE NEGATIVE Final    Comment:        The Xpert SA Assay (FDA approved for NASAL specimens in patients over 4 years of age), is one component of a comprehensive surveillance program.  Test performance has been validated by [facility] for patients greater than or equal to 49 year old. It is not intended to diagnose infection nor to guide or monitor treatment.          Radiology Studies: No results found.      Scheduled Meds: . alvimopan  12 mg Oral BID  . amLODipine  10 mg Oral Daily  . heparin subcutaneous  5,000 Units Subcutaneous Q8H  . losartan  100 mg Oral Daily   Continuous Infusions: . dextrose 5 % and 0.45 % NaCl with KCl 20 mEq/L 50 mL/hr at 09/10/15 2201     LOS: 7 days    Time spent: 25 mins    Amy George Hugh   [facility] Pager [phone]  If 7PM-7AM, please contact night-coverage [email or web address] Password  [facility] 09/11/2015, 8:12 AM   Addendum  I personally evaluated Mr. Broshears on 09/11/2015 and agree with above findings. Overall Mr. Kolodziej making excellent progress, he had 2 bowel movements this morning and has been tolerating his clears. He also tells he's been walking down the hallways several times a day. Surgery recommending further advancing his diet today to full liquid diet. On physical examination his abdomen is nontender, surgical incision site healing well. I think he likely be ready for discharge in the next 24-48 hours.

## 2015-09-11 NOTE — Progress Notes (Signed)
Physical Therapy Treatment Patient Details Name: Ian Sullivan. MRN: [id number] DOB: Jul 31, 1927 Today's Date: 09/11/2015    History of Present Illness 82 yo admitted with progression abdominal pain and distention.  He was found to have a volvulus that was decompressed and he underwent a partial colectomy 09/07/2015.  Pt reports he has had 3 back surgeries and has lots of PT before     PT Comments    Assisted with amb a greater distance in hallway using RW for safety due to recent ABD pain.    Follow Up Recommendations  No PT follow up     Equipment Recommendations       Recommendations for Other Services       Precautions / Restrictions Precautions Precautions: Fall Precaution Comments: recent ABD surgery Restrictions Weight Bearing Restrictions: No    Mobility  Bed Mobility               General bed mobility comments: OOB in recliner  Transfers Overall transfer level: Needs assistance Equipment used: None Transfers: Sit to/from Stand           General transfer comment: good safety cognition and use of hands  Ambulation/Gait Ambulation/Gait assistance: Supervision;Min guard Ambulation Distance (Feet): 420 Feet Assistive device: Rolling walker (2 wheeled) Gait Pattern/deviations: Step-through pattern;Decreased stride length;Trunk flexed     General Gait Details: for safety and to increase amb distance   Financial trader Rank[employer] (Stroke Patients Only)       Balance                                    Cognition                            Exercises      General Comments        Pertinent Vitals/Pain      Home Living                      Prior Function            PT Goals (current goals can now be found in the care plan section) Progress towards PT goals: Progressing toward goals    Frequency       PT Plan Current plan remains appropriate     Co-evaluation             End of Session Equipment Utilized During Treatment: Gait belt Activity Tolerance: Patient tolerated treatment well Patient left: in chair;with chair alarm set;with family/visitor present     Time: [id number] PT Time Calculation (min) (ACUTE ONLY): 24 min  Charges:  $Gait Training: 8-22 mins $Therapeutic Activity: 8-22 mins                    G Codes:      Rica Koyanagi  PTA [facility]  Acute  Rehab Pager      [phone]

## 2015-09-11 NOTE — Progress Notes (Signed)
Occupational Therapy Treatment Patient Details Name: Ian Sullivan. MRN: [id number] DOB: 1927-04-11 Today's Date: 09/11/2015    History of present illness 82 yo admitted with progression abdominal pain and distention.  He was found to have a volvulus that was decompressed and he underwent a partial colectomy 09/07/2015.     OT comments  Goals met this session.  Pt much steadier and safe with bathroom transfers and retrieving clothing  Follow Up Recommendations  No OT follow up    Equipment Recommendations  None recommended by OT    Recommendations for Other Services      Precautions / Restrictions Precautions Precautions: Fall Precaution Comments: recent ABD surgery Restrictions Weight Bearing Restrictions: No       Mobility Bed Mobility               General bed mobility comments: OOB in recliner  Transfers Overall transfer level: Needs assistance Equipment used: RW  Transfers: Sit to/from Stand           General transfer comment: good safety cognition and use of hands    Balance                                   ADL                           Toilet Transfer: Ambulation;Comfort height toilet             General ADL Comments: simulated stepping over shower stall.  Pt has a grab bar to get in and showed alternative of using walker.  Walked to closet to retrieve items with cues for positioning with RW.  Pt retrieves low items with reacher at basline.  Pt walked in hall afterwards to get his second walk done today.  He is much steadier than baseline.       Vision                     Perception     Praxis      Cognition   Behavior During Therapy: WFL for tasks assessed/performed Overall Cognitive Status: Within Functional Limits for tasks assessed                       Extremity/Trunk Assessment               Exercises     Shoulder Instructions       General Comments      Pertinent  Vitals/ Pain       Pain Assessment: 0-10 Pain Score: 2  Pain Location: abdomen Pain Descriptors / Indicators: Sore Pain Intervention(s): Limited activity within patient's tolerance;Monitored during session;Repositioned  Home Living                                          Prior Functioning/Environment              Frequency       Progress Toward Goals  OT Goals(current goals can now be found in the care plan section)  Progress towards OT goals: Goals met/education completed, patient discharged from [place]  End of Session     Activity Tolerance Patient tolerated treatment well   Patient Left in chair;with call bell/phone within reach;with family/visitor present   Nurse Communication          Time: 2438-3654 OT Time Calculation (min): 21 min  Charges: OT General Charges $OT Visit: 1 Procedure OT Treatments $Self Care/Home Management : 8-22 mins  Sanam Marmo 09/11/2015, 2:17 PM  Lesle Chris, OTR/L [phone] 09/11/2015

## 2015-09-11 NOTE — Progress Notes (Signed)
4 Days Post-Op  Subjective: He looks great and has had 2 Bm's so far.  Site looks good, and lungs are clear.  Objective: Vital signs in last 24 hours: Temp:  [97.8 F (36.6 C)-99.5 F (37.5 C)] 97.8 F (36.6 C) (06/13 0655) Pulse Rate:  [62-74] 62 (06/13 1031) Resp:  [16-18] 16 (06/13 0655) BP: (128-166)/(84-99) 128/87 mmHg (06/13 1031) SpO2:  [97 %-99 %] 99 % (06/13 0655) Last BM Date: 09/11/15 240 PO Urine 2700 Afebrile, VSS Labs Ok  Intake/Output from previous day: 06/12 0701 - 06/13 0700 In: 790 [P.O.:240; I.V.:550] Out: 2700 [Urine:2700] Intake/Output this shift:    General appearance: alert, cooperative and no distress Resp: clear to auscultation bilaterally GI: soft sore, site looks good, + BS and BM  Lab Results:   Recent Labs  09/10/15 0423  WBC 5.6  HGB 13.2  HCT 39.0  PLT 254    BMET  Recent Labs  09/10/15 0423  NA 141  K 3.8  CL 104  CO2 29  GLUCOSE 103*  BUN 6  CREATININE 0.78  CALCIUM 9.1   PT/INR No results for input(s): LABPROT, INR in the last 72 hours.   Recent Labs Lab 09/07/15 0427  AST 26  ALT 21  ALKPHOS 74  BILITOT 1.2  PROT 6.1*  ALBUMIN 3.5     Lipase  No results found for: LIPASE   Studies/Results: No results found.  Medications: . alvimopan  12 mg Oral BID  . amLODipine  10 mg Oral Daily  . heparin subcutaneous  5,000 Units Subcutaneous Q8H  . losartan  100 mg Oral Daily   . dextrose 5 % and 0.45 % NaCl with KCl 20 mEq/L 50 mL/hr at 09/10/15 2201    Active problems:  Essential hypertension  ATRIAL FIBRILLATION  Sigmoid volvulus (HCC)  Chronic constipation  Hypokalemia  Assessment/Plan Sigmoid volvulus  S/p Open sigmoid colectomy with primary anastomosis, 09/10/15, Dr. Johnathan Hausen FEN: Clears/IV fluids  -  Going to soft diet ID:  Pre ope only DVT:  Heparin/SCD   Plan:  Advance diet and mobilize more.  He looks great.   LOS: 7 days     JENNINGS,WILLARD 09/11/2015 [phone]  Agree with above. Diet advanced. Bertell Maria, friend, in room with patient.  Alphonsa Overall, MD, [facility] Surgery Pager: [phone] Office phone:  [phone]

## 2015-09-12 LAB — PROTIME-INR
INR: 1.48 (ref 0.00–1.49)
Prothrombin Time: 17.5 seconds — ABNORMAL HIGH (ref 11.6–15.2)

## 2015-09-12 MED ORDER — WARFARIN SODIUM 5 MG PO TABS
7.5000 mg | ORAL_TABLET | Freq: Once | ORAL | Status: AC
Start: 2015-09-12 — End: 2015-09-12
  Administered 2015-09-12: 7.5 mg via ORAL
  Filled 2015-09-12: qty 1

## 2015-09-12 MED ORDER — WARFARIN - PHARMACIST DOSING INPATIENT: Freq: Every day | Status: DC

## 2015-09-12 NOTE — Progress Notes (Signed)
5 Days Post-Op  Subjective: He continues to look fine.  Tolerating full liquids well and had another BM.    Objective: Vital signs in last 24 hours: Temp:  [97.8 F (36.6 C)-98.5 F (36.9 C)] 98.5 F (36.9 C) (06/14 [id number]) Pulse Rate:  [67-79] 67 (06/14 0608) Resp:  [16] 16 (06/14 0608) BP: (109-130)/(62-80) 122/62 mmHg (06/14 0608) SpO2:  [100 %] 100 % (06/14 0608) Last BM Date: 09/11/15 960 PO BM x 1 Afebrile, VSS NO labs Intake/Output from previous day: 06/13 0701 - 06/14 0700 In: 1775 [P.O.:960; I.V.:815] Out: 520 [Urine:520] Intake/Output this shift: Total I/O In: 400 [P.O.:400] Out: -   General appearance: alert, cooperative and no distress GI: soft, non-tender; bowel sounds normal; no masses,  no organomegaly  Lab Results:   Recent Labs  09/10/15 0423  WBC 5.6  HGB 13.2  HCT 39.0  PLT 254    BMET  Recent Labs  09/10/15 0423  NA 141  K 3.8  CL 104  CO2 29  GLUCOSE 103*  BUN 6  CREATININE 0.78  CALCIUM 9.1   PT/INR No results for input(s): LABPROT, INR in the last 72 hours.   Recent Labs Lab 09/07/15 0427  AST 26  ALT 21  ALKPHOS 74  BILITOT 1.2  PROT 6.1*  ALBUMIN 3.5     Lipase  No results found for: LIPASE   Studies/Results: No results found. Prior to Admission medications   Medication Sig Start Date End Date Taking? Authorizing Provider  amLODipine (NORVASC) 10 MG tablet Take 1 tablet (10 mg total) by mouth daily. 01/23/15  Yes Lelon Perla, MD  Calcium 600 MG tablet Take 1,200 mg by mouth daily. Every other day   Yes Historical Provider, MD  cholecalciferol (VITAMIN D) 1000 UNITS tablet Take 1,000 Units by mouth daily.     Yes Historical Provider, MD  Ferrous Sulfate (IRON) 325 (65 FE) MG TABS Take 1 tablet by mouth daily.     Yes Historical Provider, MD  fish oil-omega-3 fatty acids 1000 MG capsule Take 2 capsules by mouth 2 (two) times daily.    Yes Historical Provider, MD  FLUoxetine (PROZAC) 20 MG tablet Take 20 mg by  mouth daily.   Yes Historical Provider, MD  furosemide (LASIX) 20 MG tablet Take 1 tablet (20 mg total) by mouth daily. 11/16/14  Yes Lelon Perla, MD  losartan (COZAAR) 100 MG tablet Take 1 tablet (100 mg total) by mouth daily. 11/16/14  Yes Lelon Perla, MD  Magnesium 100 MG CAPS Take 100 mg by mouth daily.    Yes Historical Provider, MD  Multiple Vitamin (MULTIVITAMIN) tablet Take 1 tablet by mouth daily.   Yes Historical Provider, MD  omeprazole (PRILOSEC) 20 MG capsule Take 20 mg by mouth daily.     Yes Historical Provider, MD  potassium chloride (KLOR-CON M10) 10 MEQ tablet TAKE 1 TABLET BY MOUTH EVERY DAY Patient taking differently: Take 10 mEq by mouth daily.  05/01/15  Yes Lelon Perla, MD  pravastatin (PRAVACHOL) 10 MG tablet Take 1 tablet (10 mg total) by mouth daily. 12/28/14  Yes Lelon Perla, MD  vitamin E 400 UNIT capsule Take 400 Units by mouth daily.     Yes Historical Provider, MD  warfarin (COUMADIN) 5 MG tablet TAKE 1 TO 1 & 1/2 TABLETS BY MOUTH DAILY AS DIRECTED BY COUMADIN CLINIC Patient taking differently: TAKE 5 MG BY MOUTH DAILY EXCEPT TAKE 7.5 MG ON M,W,FRI 06/27/15  Yes Denice Bors  Crenshaw, MD    Medications: . amLODipine  10 mg Oral Daily  . heparin subcutaneous  5,000 Units Subcutaneous Q8H  . losartan  100 mg Oral Daily   . dextrose 5 % and 0.45 % NaCl with KCl 20 mEq/L 1,000 mL (09/11/15 1834)    Active problems:  Essential hypertension  ATRIAL FIBRILLATION  Sigmoid volvulus (HCC)  Chronic constipation  Hypokalemia  Assessment/Plan  Sigmoid volvulus S/p Open sigmoid colectomy with primary anastomosis, 09/08/15, Dr. Johnathan Hausen FEN: Full liquids/IV fluids - Going to soft diet ID: Pre ope only DVT: Heparin/SCD    Plan:  Soft diet, saline lock IV, restart PO's including Coumadin.  Recheck labs in AM and home if doing well tomorrow.   I will get follow up info in AVS later today.  LOS: 8 days     Ian Sullivan,Ian Sullivan 09/12/2015 [phone]  Agree with above.  He looks good.  Probably home in AM. Friend, [place], sleeping in room.  Alphonsa Overall, MD, [facility] Surgery Pager: [phone] Office phone:  [phone]

## 2015-09-12 NOTE — Care Management Note (Signed)
Case Management Note  Patient Details  Name: Ian Sullivan. MRN: [id number] Date of Birth: Dec 20, 1927  Subjective/Objective:       82 yo admitted S/P sigmoid colectomy.             Action/Plan: From home alone.  PT recommended no PT follow up and no equipment.  No other CM needs communicated.  Expected Discharge Date:   (unknown)               Expected Discharge Plan:  Home/Self Care  In-House Referral:     Discharge planning Services  CM Consult  Post Acute Care Choice:    Choice offered to:     DME Arranged:    DME Agency:     HH Arranged:    HH Agency:     Status of Service:  In process, will continue to follow  Medicare Important Message Given:  Yes Date Medicare IM Given:    Medicare IM give by:    Date Additional Medicare IM Given:    Additional Medicare Important Message give by:     If discussed at [place] of Stay Meetings, dates discussed:    Additional Comments:  Lynnell Catalan, RN 09/12/2015, 11:56 AM [phone]

## 2015-09-12 NOTE — Progress Notes (Signed)
[facility]  PROGRESS NOTE  Judie Bonus. [id number] DOB: Jun 24, 1927 DOA: 09/03/2015 PCP: [name], MD    Brief HPI:  Patient with history of HTN and Afib presented to the ED with 1 week history of abdominal pain and distension. Abdominal imaging showed sigmoid volvulus. GI and surgery consulted. Volvulus has been reduced by flex sigmoidoscopy decompression. Patient moving forward with elective partial colectomy for definitive treatment. Cleared for surgery per Cardiology. Patient underwent open sigmoid colectomy with anastomosis, started on clear liquids.  Principal Problem:   S/P sigmoid colectomy June 2017 Active Problems:   Essential hypertension   ATRIAL FIBRILLATION   Sigmoid volvulus (HCC)   Chronic constipation   Hypokalemia   Assessment/Plan: 1. Sigmoid volvulus- status post open sigmoid colectomy with anastomosis. Patient presented to my volvulus and underwent open sigmoid colectomy with anastomosis on 09/07/2015. At this time diet has been advanced to a clear liquid, patient is tolerating diet well. General surgery has advanced the diet. PT recommends no follow-up. 2. Atrial fibrillation- restart Coumadin, is cleared by general surgery 3. Hypertension- blood pressure is well controlled, continue amlodipine, losartan. 4. Chronic diastolic heart failure- currently euvolemic, Lasix was held. Will restart Lasix at time of discharge.   DVT prophylaxis: Heparin Code Status: Full code Family Communication: Discussed with patient's wife at bedside Disposition Plan: Likely home in next 24-48 hours   Consultants:  Gen. Surgery  Gastroenterology  Cardiology  Procedures:  None  Antibiotics:  None  Subjective: Patient seen and examined, has been emanating in the hallway. Tolerating by mouth liquid diet well.  Objective: Filed Vitals:   09/11/15 1408 09/11/15 2139 09/12/15 0608 09/12/15 1320  BP: 130/80 109/73 122/62 127/85  Pulse: 79 76 67 57   Temp: 97.8 F (36.6 C) 97.8 F (36.6 C) 98.5 F (36.9 C) 98 F (36.7 C)  TempSrc: Oral Oral Oral Oral  Resp: 16 16 16 15   Height:      Weight:      SpO2: 100% 100% 100% 100%    Intake/Output Summary (Last 24 hours) at 09/12/15 1547 Last data filed at 09/12/15 1320  Gross per 24 hour  Intake   1200 ml  Output    520 ml  Net    680 ml   Filed Weights   09/04/15 0349  Weight: 78.7 kg (173 lb 8 oz)    Examination:  General exam: Appears calm and comfortable  Respiratory system: Clear to auscultation. Respiratory effort normal. Cardiovascular system: S1 & S2 heard, RRR. No JVD, murmurs, rubs, gallops or clicks. No pedal edema. Gastrointestinal system: Abdomen is nondistended, soft and nontender. No organomegaly or masses felt. Normal bowel sounds heard. Central nervous system: Alert and oriented. No focal neurological deficits. Extremities: Symmetric 5 x 5 power. Skin: No rashes, lesions or ulcers Psychiatry: Judgement and insight appear normal. Mood & affect appropriate.    Data Reviewed: I have personally reviewed following labs and imaging studies Basic Metabolic Panel:  Recent Labs Lab 09/06/15 0505 09/07/15 0427 09/08/15 0444 09/10/15 0423  NA 140 142 140 141  K 4.4 4.2 3.6 3.8  CL 108 111 106 104  CO2 27 27 29 29   GLUCOSE 101* 98 111* 103*  BUN 11 11 9 6   CREATININE 0.78 0.84 0.82 0.78  CALCIUM 8.6* 8.6* 8.4* 9.1   Liver Function Tests:  Recent Labs Lab 09/07/15 0427  AST 26  ALT 21  ALKPHOS 74  BILITOT 1.2  PROT 6.1*  ALBUMIN 3.5   CBC:  Recent  Labs Lab 09/06/15 0505 09/07/15 0427 09/07/15 1859 09/08/15 0444 09/10/15 0423  WBC 6.1 5.4 8.1 5.8 5.6  NEUTROABS  --  3.2  --   --   --   HGB 12.6* 12.4* 12.2* 12.5* 13.2  HCT 36.8* 37.0* 36.7* 37.5* 39.0  MCV 93.6 96.9 97.6 97.7 95.8  PLT 178 197 211 205 254   Cardiac Enzymes: No results for input(s): CKTOTAL, CKMB, CKMBINDEX, TROPONINI in the last 168 hours. BNP (last 3  results)  Recent Labs  11/16/14 1446  BNP 388.1*    ProBNP (last 3 results) No results for input(s): PROBNP in the last 8760 hours.  CBG: No results for input(s): GLUCAP in the last 168 hours.  Recent Results (from the past 240 hour(s))  Surgical pcr screen     Status: None   Collection Time: 09/07/15  5:04 AM  Result Value Ref Range Status   MRSA, PCR NEGATIVE NEGATIVE Final   Staphylococcus aureus NEGATIVE NEGATIVE Final    Comment:        The Xpert SA Assay (FDA approved for NASAL specimens in patients over 91 years of age), is one component of a comprehensive surveillance program.  Test performance has been validated by [facility] for patients greater than or equal to 16 year old. It is not intended to diagnose infection nor to guide or monitor treatment.      Studies: No results found.  Scheduled Meds: . amLODipine  10 mg Oral Daily  . heparin subcutaneous  5,000 Units Subcutaneous Q8H  . losartan  100 mg Oral Daily  . warfarin  7.5 mg Oral ONCE-1800  . Warfarin - Pharmacist Dosing Inpatient   Does not apply q1800   Continuous Infusions:      Time spent: 20 min    [facility] Pager [phone]. If 7PM-7AM, please contact night-coverage at [email or web address], Office  [phone]  password [email or web address] 09/12/2015, 3:47 PM  LOS: 8 days

## 2015-09-12 NOTE — Progress Notes (Signed)
ANTICOAGULATION CONSULT NOTE - Initial Consult  Pharmacy Consult for Warfarin Indication: atrial fibrillation  Allergies  Allergen Reactions  . Horse-Derived Products     Patient Measurements: Height: 5\' 11"  (180.3 cm) Weight: 173 lb 8 oz (78.7 kg) IBW/kg (Calculated) : 75.3  Vital Signs: Temp: 98.5 F (36.9 C) (06/14 0608) Temp Source: Oral (06/14 0608) BP: 122/62 mmHg (06/14 0608) Pulse Rate: 67 (06/14 0608)  Labs:  Recent Labs  09/10/15 0423  HGB 13.2  HCT 39.0  PLT 254  CREATININE 0.78    Estimated Creatinine Clearance: 70.6 mL/min (by C-G formula based on Cr of 0.78).   Medical History: Past Medical History  Diagnosis Date  . Unspecified essential hypertension   . Anxiety state, unspecified     past at death of spouse  . Depressive disorder, not elsewhere classified     past at death of spouse  . Other and unspecified hyperlipidemia   . Dyskinesia of esophagus   . Long term (current) use of anticoagulants   . Hiatal hernia   . Duodenitis without mention of hemorrhage   . Unspecified gastritis and gastroduodenitis without mention of hemorrhage   . Unspecified hemorrhoids without mention of complication   . Diverticulosis of colon (without mention of hemorrhage)   . Barrett's esophagus   . Irritable bowel syndrome   . Unspecified sleep apnea   . Anemia   . Atrial fibrillation ([place])   . Skin cancer (melanoma) ([place])   . Status post dilation of esophageal narrowing   . GERD (gastroesophageal reflux disease)   . Hereditary and idiopathic peripheral neuropathy 10/23/2014    Medications:  Scheduled:  . amLODipine  10 mg Oral Daily  . heparin subcutaneous  5,000 Units Subcutaneous Q8H  . losartan  100 mg Oral Daily    Assessment: 82 yo on warfarin PTA for Afib. Held d/t surgery for sigmoid volvulus. Pharmacy consulted to resume warfarin.  Hgb 13.2, plt wnl, INR (last checked 6/10) 1.71  PTA dosing: 5mg  daily except 7.5 mg MWF  Goal of Therapy:   INR 2-3 Monitor platelets by anticoagulation protocol: Yes   Plan:  Resume Warfarin home dosing Plan: 7.5mg  x1 tonight, will hold if INR today comes back elevated Daily INR, watch CBC  Darl Pikes, PharmD Clinical Pharmacist- Resident Pager: [phone]  Darl Pikes 09/12/2015,12:01 PM

## 2015-09-12 NOTE — Progress Notes (Signed)
Pharmacy - Warfarin  Assessment:    Please see note from Nuala Alpha, PharmD earlier today for full details.  Briefly, 82 y.o. male on warfarin for Afib.  INR rechecked before resuming as had been creeping up after Flagyl x 3 doses and decreased PO intake.   INR 1.48  Plan:   Continue with warfarin as ordered  Reuel Boom, PharmD, BCPS Pager: [phone] 09/12/2015, 2:06 PM

## 2015-09-12 NOTE — Discharge Instructions (Signed)
[facility]      [facility] °[phone] ° °OPEN ABDOMINAL SURGERY: POST OP INSTRUCTIONS ° °Always review your discharge instruction sheet given to you by the facility where your surgery was performed. ° °IF YOU HAVE DISABILITY OR FAMILY LEAVE FORMS, YOU MUST BRING THEM TO THE OFFICE FOR PROCESSING.  PLEASE DO NOT GIVE THEM TO YOUR DOCTOR. ° °1. A prescription for pain medication may be given to you upon discharge.  Take your pain medication as prescribed, if needed.  If narcotic pain medicine is not needed, then you may take acetaminophen (Tylenol) or ibuprofen (Advil) as needed. °2. Take your usually prescribed medications unless otherwise directed. °3. If you need a refill on your pain medication, please contact your pharmacy. They will contact our office to request authorization.  Prescriptions will not be filled after 5pm or on week-ends. °4. You should follow a light diet the first few days after arrival home, such as soup and crackers, pudding, etc.unless your doctor has advised otherwise. A high-fiber, low fat diet can be resumed as tolerated.   Be sure to include lots of fluids daily. Most patients will experience some swelling and bruising on the chest and neck area.  Ice packs will help.  Swelling and bruising can take several days to resolve °5. Most patients will experience some swelling and bruising in the area of the incision. Ice pack will help. Swelling and bruising can take several days to resolve..  °6. It is common to experience some constipation if taking pain medication after surgery.  Increasing fluid intake and taking a stool softener will usually help or prevent this problem from occurring.  A mild laxative (Milk of Magnesia or Miralax) should be taken according to package directions if there are no bowel movements after 48 hours. °7.  You may have steri-strips (small skin tapes) in place directly over the incision.  These strips should be left on the skin for 7-10 days.  If your  surgeon used skin glue on the incision, you may shower in 24 hours.  The glue will flake off over the next 2-3 weeks.  Any sutures or staples will be removed at the office during your follow-up visit. You may find that a light gauze bandage over your incision may keep your staples from being rubbed or pulled. You may shower and replace the bandage daily. °8. ACTIVITIES:  You may resume regular (light) daily activities beginning the next day--such as daily self-care, walking, climbing stairs--gradually increasing activities as tolerated.  You may have sexual intercourse when it is comfortable.  Refrain from any heavy lifting or straining until approved by your doctor. °a. You may drive when you no longer are taking prescription pain medication, you can comfortably wear a seatbelt, and you can safely maneuver your car and apply brakes °b. Return to Work: ___________________________________ °9. You should see your doctor in the office for a follow-up appointment approximately two weeks after your surgery.  Make sure that you call for this appointment within a day or two after you arrive home to insure a convenient appointment time. °OTHER INSTRUCTIONS:  °_____________________________________________________________ °_____________________________________________________________ ° °WHEN TO CALL YOUR DOCTOR: °1. Fever over 101.0 °2. Inability to urinate °3. Nausea and/or vomiting °4. Extreme swelling or bruising °5. Continued bleeding from incision. °6. Increased pain, redness, or drainage from the incision. °7. Difficulty swallowing or breathing °8. Muscle cramping or spasms. °9. Numbness or tingling in hands or feet or around lips. ° °The clinic staff is available to   answer your questions during regular business hours.  Please dont hesitate to call and ask to speak to one of the nurses if you have concerns.  For further questions, please visit [email or web address]  Soft-Food Meal Plan for the next week,  then resume your normal diet. A soft-food meal plan includes foods that are safe and easy to swallow. This meal plan typically is used:  If you are having trouble chewing or swallowing foods.  As a transition meal plan after only having had liquid meals for a long period. WHAT DO I NEED TO KNOW ABOUT THE SOFT-FOOD MEAL PLAN? A soft-food meal plan includes tender foods that are soft and easy to chew and swallow. In most cases, bite-sized pieces of food are easier to swallow. A bite-sized piece is about  inch or smaller. Foods in this plan do not need to be ground or pureed. Foods that are very hard, crunchy, or sticky should be avoided. Also, breads, cereals, yogurts, and desserts with nuts, seeds, or fruits should be avoided. WHAT FOODS CAN I EAT? Grains Rice and wild rice. Moist bread, dressing, pasta, and noodles. Well-moistened dry or cooked cereals, such as farina (cooked wheat cereal), oatmeal, or grits. Biscuits, breads, muffins, pancakes, and waffles that have been well moistened. Vegetables Shredded lettuce. Cooked, tender vegetables, including potatoes without skins. Vegetable juices. Broths or creamed soups made with vegetables that are not stringy or chewy. Strained tomatoes (without seeds). Fruits Canned or well-cooked fruits. Soft (ripe), peeled fresh fruits, such as peaches, nectarines, kiwi, cantaloupe, honeydew melon, and watermelon (without seeds). Soft berries with small seeds, such as strawberries. Fruit juices (without pulp). Meats and Other Protein Sources Moist, tender, lean beef. Mutton. Lamb. Veal. Chicken. [place]. Liver. Ham. Fish without bones. Eggs. Dairy Milk, milk drinks, and cream. Plain cream cheese and cottage cheese. Plain yogurt. Sweets/Desserts Flavored gelatin desserts. Custard. Plain ice cream, frozen yogurt, sherbet, milk shakes, and malts. Plain cakes and cookies. Plain hard candy.  Other Butter, margarine (without trans fat), and cooking oils.  Mayonnaise. Cream sauces. Mild spices, salt, and sugar. Syrup, molasses, honey, and jelly. The items listed above may not be a complete list of recommended foods or beverages. Contact your dietitian for more options. WHAT FOODS ARE NOT RECOMMENDED? Grains Dry bread, toast, crackers that have not been moistened. Coarse or dry cereals, such as bran, granola, and shredded wheat. Tough or chewy crusty breads, such as [place] bread or baguettes. Vegetables Corn. Raw vegetables except shredded lettuce. Cooked vegetables that are tough or stringy. Tough, crisp, fried potatoes and potato skins. Fruits Fresh fruits with skins or seeds or both, such as apples, pears, or grapes. Stringy, high-pulp fruits, such as papaya, pineapple, coconut, or mango. Fruit leather, fruit roll-ups, and all dried fruits. Meats and Other Protein Sources Sausages and hot dogs. Meats with gristle. Fish with bones. Nuts, seeds, and chunky peanut or other nut butters. Sweets/Desserts Cakes or cookies that are very dry or chewy.  The items listed above may not be a complete list of foods and beverages to avoid. Contact your dietitian for more information.   This information is not intended to replace advice given to you by your health care provider. Make sure you discuss any questions you have with your health care provider.   Document Released: 06/24/2007 Document Revised: 03/22/2013 Document Reviewed: 02/11/2013 Elsevier Interactive Patient Education [employer].

## 2015-09-13 LAB — CBC
HEMATOCRIT: 37.5 % — AB (ref 39.0–52.0)
Hemoglobin: 12.9 g/dL — ABNORMAL LOW (ref 13.0–17.0)
MCH: 33 pg (ref 26.0–34.0)
MCHC: 34.4 g/dL (ref 30.0–36.0)
MCV: 95.9 fL (ref 78.0–100.0)
Platelets: 325 10*3/uL (ref 150–400)
RBC: 3.91 MIL/uL — ABNORMAL LOW (ref 4.22–5.81)
RDW: 13.7 % (ref 11.5–15.5)
WBC: 4.9 10*3/uL (ref 4.0–10.5)

## 2015-09-13 LAB — PROTIME-INR
INR: 1.5 — AB (ref 0.00–1.49)
Prothrombin Time: 17.6 seconds — ABNORMAL HIGH (ref 11.6–15.2)

## 2015-09-13 MED ORDER — WARFARIN SODIUM 5 MG PO TABS: 5.0000 mg | ORAL_TABLET | Freq: Every day | ORAL | Status: DC

## 2015-09-13 MED ORDER — HYDROCODONE-ACETAMINOPHEN 5-325 MG PO TABS: 1.0000 | ORAL_TABLET | ORAL | Status: DC | PRN

## 2015-09-13 MED ORDER — ACETAMINOPHEN 325 MG PO TABS: 650.0000 mg | ORAL_TABLET | Freq: Four times a day (QID) | ORAL | Status: AC | PRN

## 2015-09-13 MED ORDER — WARFARIN SODIUM 5 MG PO TABS: 5.0000 mg | ORAL_TABLET | Freq: Once | ORAL | Status: DC

## 2015-09-13 NOTE — Progress Notes (Signed)
Patient discharged to home with family, discharge instructions reviewed with patient who verbalized understanding. No new RX for patient. 

## 2015-09-13 NOTE — Discharge Summary (Signed)
Physician Discharge Summary  Ian Sullivan. [id number] DOB: 1927-12-24 DOA: 09/03/2015  PCP: [name], MD  Admit date: 09/03/2015 Discharge date: 09/13/2015  Time spent: 35 minutes  Recommendations for Outpatient Follow-up:  1. Follow up Gen surgery on 09/28/15 2. Follow up PCP to check PT/INR in one week   Discharge Diagnoses:  Principal Problem:   S/P sigmoid colectomy June 2017 Active Problems:   Essential hypertension   ATRIAL FIBRILLATION   Sigmoid volvulus (HCC)   Chronic constipation   Hypokalemia   Discharge Condition: Stable  Diet recommendation: regular diet  Filed Weights   09/04/15 0349  Weight: 78.7 kg (173 lb 8 oz)    History of present illness:  Patient with history of HTN and Afib presented to the ED with 1 week history of abdominal pain and distension. Abdominal imaging showed sigmoid volvulus. GI and surgery consulted. Volvulus has been reduced by flex sigmoidoscopy decompression. Patient moving forward with elective partial colectomy for definitive treatment. Cleared for surgery per Cardiology. Patient underwent open sigmoid colectomy with anastomosis, started on clear liquids.  Hospital Course:  1. Sigmoid volvulus- status post open sigmoid colectomy with anastomosis. Patient presented  With sigmoid volvulus and underwent open sigmoid colectomy with anastomosis on 09/07/2015. At this time diet has been tolerating regular diet and cleared for discharge as per surgery. General surgery has advanced the diet. PT recommends no follow-up. 2. Atrial fibrillation- restart Coumadin, is cleared by general surgery. Will change the dose of Coumadin to 5 mg daily except Tuesday and Thursday 7.5 mg by mouth daily as patient came to hospital with elevated INR of 3.35 on previous dosing. 3. Hypertension- blood pressure is well controlled, continue amlodipine, losartan. 4. Chronic diastolic heart failure- currently euvolemic, Lasix was held. Will restart Lasix at  time of discharge.  Procedures:  Open sigmoid colectomy with anastomosis  Consultations:  General surgery  Discharge Exam: Filed Vitals:   09/12/15 2207 09/13/15 0547  BP: 141/72 127/63  Pulse: 52 58  Temp: 98.3 F (36.8 C) 98 F (36.7 C)  Resp: 16 16    General: Appears in no acute distress Cardiovascular: S1-S2 regular Respiratory: Clear to auscultation bilaterally  Discharge Instructions   Discharge Instructions    Diet - low sodium heart healthy    Complete by:  As directed      Increase activity slowly    Complete by:  As directed           Current Discharge Medication List    START taking these medications   Details  acetaminophen (TYLENOL) 325 MG tablet Take 2 tablets (650 mg total) by mouth every 6 (six) hours as needed for mild pain or moderate pain. Qty: 30 tablet, Refills: 2      CONTINUE these medications which have CHANGED   Details  warfarin (COUMADIN) 5 MG tablet Take 1 tablet (5 mg total) by mouth daily at 6 PM. 5mg  daily except 7.5mg  on Tuesday/Thursday Qty: 40 tablet, Refills: 3      CONTINUE these medications which have NOT CHANGED   Details  amLODipine (NORVASC) 10 MG tablet Take 1 tablet (10 mg total) by mouth daily. Qty: 90 tablet, Refills: 3    Calcium 600 MG tablet Take 1,200 mg by mouth daily. Every other day    cholecalciferol (VITAMIN D) 1000 UNITS tablet Take 1,000 Units by mouth daily.      Ferrous Sulfate (IRON) 325 (65 FE) MG TABS Take 1 tablet by mouth daily.  fish oil-omega-3 fatty acids 1000 MG capsule Take 2 capsules by mouth 2 (two) times daily.     FLUoxetine (PROZAC) 20 MG tablet Take 20 mg by mouth daily.    furosemide (LASIX) 20 MG tablet Take 1 tablet (20 mg total) by mouth daily. Qty: 90 tablet, Refills: 3   Associated Diagnoses: Atrial fibrillation, unspecified    losartan (COZAAR) 100 MG tablet Take 1 tablet (100 mg total) by mouth daily. Qty: 90 tablet, Refills: 3   Associated Diagnoses: Atrial  fibrillation, unspecified    Magnesium 100 MG CAPS Take 100 mg by mouth daily.     Multiple Vitamin (MULTIVITAMIN) tablet Take 1 tablet by mouth daily.    omeprazole (PRILOSEC) 20 MG capsule Take 20 mg by mouth daily.      potassium chloride (KLOR-CON M10) 10 MEQ tablet TAKE 1 TABLET BY MOUTH EVERY DAY Qty: 30 tablet, Refills: 6    pravastatin (PRAVACHOL) 10 MG tablet Take 1 tablet (10 mg total) by mouth daily. Qty: 30 tablet, Refills: 3    vitamin E 400 UNIT capsule Take 400 Units by mouth daily.         Allergies  Allergen Reactions  . Horse-Derived Products    Follow-up Information    Follow up with [facility] On 09/18/2015.   Specialty:  General Surgery   Why:  Your appointment is at 10 AM for staple removal, be at the office 30 minutes early for check in.   Contact information:   [address] [place] [address] [phone]       Follow up with Pedro Earls, MD On 09/28/2015.   Specialty:  General Surgery   Why:  your appointment is at 11:30 AM be at the office 30 minutes early for check in.   Contact information:   [address] [place] [address] [phone]        The results of significant diagnostics from this hospitalization (including imaging, microbiology, ancillary and laboratory) are listed below for reference.    Significant Diagnostic Studies: Dg Abd 1 View  09/04/2015  CLINICAL DATA:  Sigmoidoscopy for sigmoid volvulus. EXAM: ABDOMEN - 1 VIEW COMPARISON:  09/03/2015 CT FINDINGS: Pigtail catheter has been placed in the rectum. Distal colonic distention has improved. Proximal colonic distention remains prominent. No supine evidence of pneumoperitoneum. Nasogastric tube with tip at the distal stomach. IMPRESSION: After endoscopic decompression distal colonic distention is improved. Proximal colonic distention remains prominent. Electronically Signed   By: Monte Fantasia M.D.   On: 09/04/2015 03:38   Dg Abd 1  View  09/03/2015  CLINICAL DATA:  Evaluate NG tube placement EXAM: ABDOMEN - 1 VIEW COMPARISON:  KUB from earlier today FINDINGS: The distal tip of the NG tube is just below the GE junction with side port above the GE junction. Recommend advancement. Diffuse dilated loops of large and small bowel again identified. IMPRESSION: The side port of the NG tube is above the GE junction. The patient may benefit from advancing the NG tube several cm. Continued bowel distention. Electronically Signed   By: Dorise Bullion III M.D   On: 09/03/2015 16:59   Ct Abdomen Pelvis W Contrast  09/03/2015  CLINICAL DATA:  Generalized abdominal pain EXAM: CT ABDOMEN AND PELVIS WITH CONTRAST TECHNIQUE: Multidetector CT imaging of the abdomen and pelvis was performed using the standard protocol following bolus administration of intravenous contrast. CONTRAST:  100 mL Isovue-370 COMPARISON:  Plain film from earlier in the same  day FINDINGS: Lung bases demonstrate some minimal right basilar atelectatic changes. No focal infiltrate or sizable effusion is noted. The liver, gallbladder, spleen, adrenal glands and pancreas are within normal limits. Kidneys are well visualized bilaterally with a normal enhancement pattern. Delayed images demonstrate normal excretion of contrast and small para pelvic cysts bilaterally. A nasogastric catheter is noted coiled within the stomach. There is significant distension of the colon identified with an abrupt caliber change in the rectosigmoid with swirling vasculature surrounding the area of narrowing consistent with sigmoid volvulus. Significant redundancy of the sigmoid is noted. Small bowel as visualized is within normal limits. No pneumatosis or free air is identified at this time. The appendix is not visualize consistent with a prior surgical history. The bladder is well distended. No pelvic mass lesion or sidewall abnormality is noted. No acute bony abnormality is noted. Postsurgical changes in the  lumbar spine are seen IMPRESSION: Changes consistent with sigmoid volvulus with significant colonic distention. No free air or pneumatosis is noted at this time. Mild bibasilar atelectasis. No other acute abnormality is noted. Critical Value/emergent results were called by telephone at the time of interpretation on 09/03/2015 at 7:13 pm to Dr. Johnney Killian , who verbally acknowledged these results. Electronically Signed   By: Inez Catalina M.D.   On: 09/03/2015 19:15   Dg Abd Acute W/chest  09/03/2015  CLINICAL DATA:  Constipation and increasing abdominal distention. EXAM: DG ABDOMEN ACUTE W/ 1V CHEST COMPARISON:  Chest x-ray 09/13/2014. FINDINGS: The heart is mildly enlarged but stable. Stable tortuosity and calcification of the thoracic aorta. Bibasilar scarring changes and eventration of the right hemidiaphragm. Stable scarring changes at the left lung base. The bony thorax is intact. They spinal cord stimulator is noted. Dilated small bowel and colon consistent with a diffuse ileus. No findings for small bowel obstruction or free air. Lower lumbar fusion hardware noted. IMPRESSION: Diffusely distended, gas-filled small bowel and colon consistent with a diffuse ileus. No definite free air. No acute cardiopulmonary findings. Electronically Signed   By: Marijo Sanes M.D.   On: 09/03/2015 15:03   Dg Abd Portable 1v  09/04/2015  CLINICAL DATA:  Abdominal distension with sigmoid volvulus EXAM: PORTABLE ABDOMEN - 1 VIEW COMPARISON:  09/04/2015 at 2:59 a.m. FINDINGS: NG tube again projects over the stomach. No change in the position of rectal catheter which projects with tip in the midline of the inferior pelvis. Diffuse gaseous distention of small and large bowel again identified including large bowel distention in the midline of the mid to upper abdomen to a diameter of 13.5 cm. IMPRESSION: No change in appearance when compared to prior study with significant persistent gaseous distensionof the large bowel.  Electronically Signed   By: Skipper Cliche M.D.   On: 09/04/2015 09:48    Microbiology: Recent Results (from the past 240 hour(s))  Surgical pcr screen     Status: None   Collection Time: 09/07/15  5:04 AM  Result Value Ref Range Status   MRSA, PCR NEGATIVE NEGATIVE Final   Staphylococcus aureus NEGATIVE NEGATIVE Final    Comment:        The Xpert SA Assay (FDA approved for NASAL specimens in patients over 28 years of age), is one component of a comprehensive surveillance program.  Test performance has been validated by [facility] for patients greater than or equal to 1 year old. It is not intended to diagnose infection nor to guide or monitor treatment.      Labs: Basic Metabolic Panel:  Recent Labs Lab 09/07/15 0427 09/08/15 0444 09/10/15 0423  NA 142 140 141  K 4.2 3.6 3.8  CL 111 106 104  CO2 27 29 29   GLUCOSE 98 111* 103*  BUN 11 9 6   CREATININE 0.84 0.82 0.78  CALCIUM 8.6* 8.4* 9.1   Liver Function Tests:  Recent Labs Lab 09/07/15 0427  AST 26  ALT 21  ALKPHOS 74  BILITOT 1.2  PROT 6.1*  ALBUMIN 3.5   No results for input(s): LIPASE, AMYLASE in the last 168 hours. No results for input(s): AMMONIA in the last 168 hours. CBC:  Recent Labs Lab 09/07/15 0427 09/07/15 1859 09/08/15 0444 09/10/15 0423 09/13/15 0403  WBC 5.4 8.1 5.8 5.6 4.9  NEUTROABS 3.2  --   --   --   --   HGB 12.4* 12.2* 12.5* 13.2 12.9*  HCT 37.0* 36.7* 37.5* 39.0 37.5*  MCV 96.9 97.6 97.7 95.8 95.9  PLT 197 211 205 254 325   Cardiac Enzymes: No results for input(s): CKTOTAL, CKMB, CKMBINDEX, TROPONINI in the last 168 hours. BNP: BNP (last 3 results)  Recent Labs  11/16/14 1446  BNP 388.1*        Signed:  Eleonore Chiquito S MD.  [facility] 09/13/2015, 10:21 AM

## 2015-09-13 NOTE — Progress Notes (Signed)
[facility] Office:  [phone] General Surgery Progress Note   LOS: 9 days  POD -  6 Days Post-Op  Assessment/Plan: 1.  OPEN SIGMOID COLECTOMY WITH ANASTAMOSIS - 09/07/2015 - [name]  For a sigmoid volvulus  On soft diet.  Okay to go home. He will get staples out in our office next week (6/20 - 10:00 AM) and see Dr. Hassell Done (6/30 - 11:30 AM) in about 2 weeks - these dates should be on his AVS.  I gave him a copy of his path report.  2.  HTN 3.  A Fib  Seen By Dr. Kirk Ruths  Back on coumadin - PT/INR - 17.6/1.5 - 09/13/2015  4.  DVT prophylaxis - Coumadin restarted   Principal Problem:   S/P sigmoid colectomy June 2017 Active Problems:   Essential hypertension   ATRIAL FIBRILLATION   Sigmoid volvulus (HCC)   Chronic constipation   Hypokalemia   Subjective:  Looks okay.  Ready to go home.  Cleotilde Sullivan, in room with him.  Objective:   Filed Vitals:   09/12/15 2207 09/13/15 0547  BP: 141/72 127/63  Pulse: 52 58  Temp: 98.3 F (36.8 C) 98 F (36.7 C)  Resp: 16 16     Intake/Output from previous day:  06/14 0701 - 06/15 0700 In: 880 [P.O.:880] Out: 300 [Urine:300]  Intake/Output this shift:      Physical Exam:   General: Older thin WM who is alert and oriented.    HEENT: Normal. Pupils equal. .   Lungs: Clear   Abdomen: Soft.  Has BS.     Wound looks okay.  Will get staples out next week.   Lab Results:     Recent Labs  09/13/15 0403  WBC 4.9  HGB 12.9*  HCT 37.5*  PLT 325    BMET  No results for input(s): NA, K, CL, CO2, GLUCOSE, BUN, CREATININE, CALCIUM in the last 72 hours.  PT/INR    Recent Labs  09/12/15 1304 09/13/15 0403  LABPROT 17.5* 17.6*  INR 1.48 1.50*    ABG  No results for input(s): PHART, HCO3 in the last 72 hours.  Invalid input(s): PCO2, PO2   Studies/Results:  No results found.   Anti-infectives:   Anti-infectives    Start     Dose/Rate Route Frequency Ordered Stop   09/07/15  2000  cefoTEtan (CEFOTAN) 2 g in dextrose 5 % 50 mL IVPB     2 g 100 mL/hr over 30 Minutes Intravenous Every 12 hours 09/07/15 1049 09/07/15 2025   09/07/15 0600  cefoTEtan (CEFOTAN) 2 g in dextrose 5 % 50 mL IVPB     2 g 100 mL/hr over 30 Minutes Intravenous On call to O.R. 09/06/15 0940 09/07/15 0815   09/06/15 1400  neomycin (MYCIFRADIN) tablet 1,000 mg     1,000 mg Oral 3 times per day 09/06/15 0940 09/06/15 2228   09/06/15 1400  metroNIDAZOLE (FLAGYL) tablet 1,000 mg     1,000 mg Oral 3 times per day 09/06/15 0940 09/06/15 2228      Alphonsa Overall, MD, FACS Pager: [facility] Office: [phone] 09/13/2015

## 2015-09-13 NOTE — Progress Notes (Signed)
ANTICOAGULATION CONSULT NOTE - Initial Consult  Pharmacy Consult for Warfarin Indication: atrial fibrillation  Labs:  Recent Labs  09/12/15 1304 09/13/15 0403  HGB  --  12.9*  HCT  --  37.5*  PLT  --  325  LABPROT 17.5* 17.6*  INR 1.48 1.50*    Estimated Creatinine Clearance: 70.6 mL/min (by C-G formula based on Cr of 0.78).  Medications:  Scheduled:  . amLODipine  10 mg Oral Daily  . heparin subcutaneous  5,000 Units Subcutaneous Q8H  . losartan  100 mg Oral Daily  . Warfarin - Pharmacist Dosing Inpatient   Does not apply q1800    Assessment: 82 yo on warfarin PTA for Afib. INR was slightly supratherapeutic on admission at 3.16. Reversed with 1mg  Vit K on 6/6 and held d/t surgery for sigmoid volvulus. Pharmacy consulted to resume warfarin.  Hgb 12.9, plt wnl, INR 1.5  PTA dosing: 5mg  daily except 7.5 mg MWF  Goal of Therapy:  INR 2-3 Monitor platelets by anticoagulation protocol: Yes   Plan:  Warfarin 5mg  x1 tonight If patient goes home recommend slight decrease in warfarin weekly dose as the patient came in supratherapeutic. Recommend: 5mg  daily except 7.5mg  on Tuesday/Thursday Daily INR, watch CBC  Darl Pikes, PharmD Clinical Pharmacist- Resident Pager: [phone]  Darl Pikes 09/13/2015,8:10 AM

## 2015-09-20 ENCOUNTER — Ambulatory Visit (INDEPENDENT_AMBULATORY_CARE_PROVIDER_SITE_OTHER): Payer: Medicare Other | Admitting: *Deleted

## 2015-09-20 DIAGNOSIS — Z7901 Long term (current) use of anticoagulants: Secondary | ICD-10-CM | POA: Diagnosis not present

## 2015-09-20 DIAGNOSIS — I482 Chronic atrial fibrillation, unspecified: Secondary | ICD-10-CM

## 2015-09-20 DIAGNOSIS — Z5181 Encounter for therapeutic drug level monitoring: Secondary | ICD-10-CM

## 2015-09-20 DIAGNOSIS — I4891 Unspecified atrial fibrillation: Secondary | ICD-10-CM

## 2015-09-20 LAB — POCT INR: INR: 2.5

## 2015-10-10 NOTE — Progress Notes (Signed)
HPI: FU permanent atrial fibrillation. Last echo in August 2016 showed normal LV function, elevated left ventricular filling pressures, mild aortic and mitral regurgitation, severe left atrial enlargement, moderate right atrial enlargement, mild right ventricular enlargement and moderate tricuspid regurgitation. At previous office visit patient noted increased dyspnea. Holter monitor August 2016 showed A. Fib with rate decreased. Carvedilol discontinued. BNP 388. Started on lasix. Patient had sigmoid colectomy because of volvulus in June 2017.Since last seen, Patient denies dyspnea, chest pain, palpitations or syncope. Chronic pedal edema.  Current Outpatient Prescriptions  Medication Sig Dispense Refill  . acetaminophen (TYLENOL) 325 MG tablet Take 2 tablets (650 mg total) by mouth every 6 (six) hours as needed for mild pain or moderate pain. 30 tablet 2  . amLODipine (NORVASC) 10 MG tablet Take 1 tablet (10 mg total) by mouth daily. 90 tablet 3  . Calcium 600 MG tablet Take 1,200 mg by mouth daily. Every other day    . cholecalciferol (VITAMIN D) 1000 UNITS tablet Take 1,000 Units by mouth daily.      . Ferrous Sulfate (IRON) 325 (65 FE) MG TABS Take 1 tablet by mouth daily.      . fish oil-omega-3 fatty acids 1000 MG capsule Take 2 capsules by mouth 2 (two) times daily.     Marland Kitchen FLUoxetine (PROZAC) 20 MG tablet Take 20 mg by mouth daily.    . furosemide (LASIX) 20 MG tablet Take 1 tablet (20 mg total) by mouth daily. 90 tablet 3  . losartan (COZAAR) 100 MG tablet Take 1 tablet (100 mg total) by mouth daily. 90 tablet 3  . Magnesium 100 MG CAPS Take 100 mg by mouth daily.     . Multiple Vitamin (MULTIVITAMIN) tablet Take 1 tablet by mouth daily.    Marland Kitchen omeprazole (PRILOSEC) 20 MG capsule Take 20 mg by mouth daily.      . potassium chloride (KLOR-CON M10) 10 MEQ tablet TAKE 1 TABLET BY MOUTH EVERY DAY (Patient taking differently: Take 10 mEq by mouth daily. ) 30 tablet 6  . pravastatin  (PRAVACHOL) 10 MG tablet Take 1 tablet (10 mg total) by mouth daily. 30 tablet 3  . vitamin E 400 UNIT capsule Take 400 Units by mouth daily.      Marland Kitchen warfarin (COUMADIN) 5 MG tablet Take 1 tablet (5 mg total) by mouth daily at 6 PM. 5mg  daily except 7.5mg  on Tuesday/Thursday 40 tablet 3   No current facility-administered medications for this visit.     Past Medical History  Diagnosis Date  . Unspecified essential hypertension   . Anxiety state, unspecified     past at death of spouse  . Depressive disorder, not elsewhere classified     past at death of spouse  . Other and unspecified hyperlipidemia   . Dyskinesia of esophagus   . Long term (current) use of anticoagulants   . Hiatal hernia   . Duodenitis without mention of hemorrhage   . Unspecified gastritis and gastroduodenitis without mention of hemorrhage   . Unspecified hemorrhoids without mention of complication   . Diverticulosis of colon (without mention of hemorrhage)   . Barrett's esophagus   . Irritable bowel syndrome   . Unspecified sleep apnea   . Anemia   . Atrial fibrillation ([place])   . Skin cancer (melanoma) ([place])   . Status post dilation of esophageal narrowing   . GERD (gastroesophageal reflux disease)   . Hereditary and idiopathic peripheral neuropathy 10/23/2014  Past Surgical History  Procedure Laterality Date  . Nissen fundoplication  [id number]  . Turp vaporization  1980s  . Carpal tunnel release Bilateral 2003  . Appendectomy  1945  . Tonsillectomy    . Vasectomy    . Lumbar laminectomy    . Small bowel obsturction      x4  . Cardiac catheterization  1991  . Central decompressive laminectomy      L2-L5  . Esophagogastroduodenoscopy    . Colonoscopy    . Spinal cord stimulator implant  06/2011    Dr. Anselm Lis  . Eus  11/13/2011    Procedure: LOWER ENDOSCOPIC ULTRASOUND (EUS);  Surgeon: Milus Banister, MD;  Location: [facility];  Service: Endoscopy;  Laterality: N/A;  . Flexible sigmoidoscopy N/A  09/04/2015    Procedure: FLEXIBLE SIGMOIDOSCOPY;  Surgeon: Manus Gunning, MD;  Location: [facility];  Service: Gastroenterology;  Laterality: N/A;  . Partial colectomy N/A 09/07/2015    Procedure: OPEN SIGMOID COLECTOMY WITH ANASTAMOSIS;  Surgeon: Johnathan Hausen, MD;  Location: [facility];  Service: General;  Laterality: N/A;    Social History   Social History  . Marital Status: Widowed    Spouse Name: N/A  . Number of Children: 1  . Years of Education: N/A   Occupational History  . retired    Social History Main Topics  . Smoking status: Former Smoker    Quit date: 03/31/1972  . Smokeless tobacco: Never Used  . Alcohol Use: 0.6 oz/week    1 Shots of liquor per week     Comment: ocass  . Drug Use: No  . Sexual Activity: Not on file   Other Topics Concern  . Not on file   Social History Narrative   Alcohol use- yes occasional    Family History  Problem Relation Age of Onset  . Stroke Mother   . Coronary artery disease Father   . Colon cancer Neg Hx   . Heart disease Other     [place]    ROS: no fevers or chills, productive cough, hemoptysis, dysphasia, odynophagia, melena, hematochezia, dysuria, hematuria, rash, seizure activity, orthopnea, PND, pedal edema, claudication. Remaining systems are negative.  Physical Exam: Well-developed well-nourished in no acute distress.  Skin is warm and dry.  HEENT is normal.  Neck is supple.  Chest is clear to auscultation with normal expansion.  Cardiovascular exam is Irregular and bradycardic Abdominal exam nontender or distended. No masses palpated. Extremities show 1+ edema. neuro grossly intact  ECG Atrial fibrillation at a rate of 47. Right bundle branch block.   Assessment and plan  1. Atrial fibrillation-heart rate remains controlled on no medications. Continue Coumadin. Not interested in [place].  2 hypertension-blood pressure controlled. Continue present medications.  3 chronic diastolic congestive heart  failure-continue present dose of Lasix.  4 hyperlipidemia-continue statin.  Kirk Ruths, MD

## 2015-10-16 ENCOUNTER — Ambulatory Visit (INDEPENDENT_AMBULATORY_CARE_PROVIDER_SITE_OTHER): Payer: Medicare Other | Admitting: Pharmacist Clinician (PhC)/ Clinical Pharmacy Specialist

## 2015-10-16 ENCOUNTER — Encounter: Payer: Self-pay | Admitting: Cardiology

## 2015-10-16 ENCOUNTER — Ambulatory Visit (INDEPENDENT_AMBULATORY_CARE_PROVIDER_SITE_OTHER): Payer: Medicare Other | Admitting: Cardiology

## 2015-10-16 VITALS — BP 140/66 | HR 47 | Ht 71.0 in | Wt 180.0 lb

## 2015-10-16 DIAGNOSIS — Z5181 Encounter for therapeutic drug level monitoring: Secondary | ICD-10-CM | POA: Diagnosis not present

## 2015-10-16 DIAGNOSIS — E785 Hyperlipidemia, unspecified: Secondary | ICD-10-CM

## 2015-10-16 DIAGNOSIS — Z7901 Long term (current) use of anticoagulants: Secondary | ICD-10-CM

## 2015-10-16 DIAGNOSIS — I482 Chronic atrial fibrillation, unspecified: Secondary | ICD-10-CM

## 2015-10-16 DIAGNOSIS — I10 Essential (primary) hypertension: Secondary | ICD-10-CM | POA: Diagnosis not present

## 2015-10-16 DIAGNOSIS — I5032 Chronic diastolic (congestive) heart failure: Secondary | ICD-10-CM

## 2015-10-16 DIAGNOSIS — I4891 Unspecified atrial fibrillation: Secondary | ICD-10-CM

## 2015-10-16 LAB — POCT INR: INR: 2.5

## 2015-10-16 NOTE — Patient Instructions (Signed)
Your physician wants you to follow-up in: 6 months with Dr. Crenshaw. You will receive a reminder letter in the mail two months in advance. If you don't receive a letter, please call our office to schedule the follow-up appointment.  If you need a refill on your cardiac medications before your next appointment, please call your pharmacy.   

## 2015-11-14 ENCOUNTER — Other Ambulatory Visit: Payer: Self-pay | Admitting: Cardiology

## 2015-11-14 DIAGNOSIS — I4891 Unspecified atrial fibrillation: Secondary | ICD-10-CM

## 2015-11-14 NOTE — Telephone Encounter (Signed)
Rx(s) sent to pharmacy electronically.  

## 2015-11-18 ENCOUNTER — Other Ambulatory Visit: Payer: Self-pay | Admitting: Cardiology

## 2015-11-19 NOTE — Telephone Encounter (Signed)
Rx request sent to pharmacy.  

## 2015-11-26 ENCOUNTER — Ambulatory Visit (INDEPENDENT_AMBULATORY_CARE_PROVIDER_SITE_OTHER): Payer: Medicare Other | Admitting: *Deleted

## 2015-11-26 DIAGNOSIS — Z5181 Encounter for therapeutic drug level monitoring: Secondary | ICD-10-CM | POA: Diagnosis not present

## 2015-11-26 DIAGNOSIS — Z7901 Long term (current) use of anticoagulants: Secondary | ICD-10-CM

## 2015-11-26 DIAGNOSIS — I4891 Unspecified atrial fibrillation: Secondary | ICD-10-CM | POA: Diagnosis not present

## 2015-11-26 LAB — POCT INR: INR: 1.9

## 2015-12-31 ENCOUNTER — Ambulatory Visit (INDEPENDENT_AMBULATORY_CARE_PROVIDER_SITE_OTHER): Payer: Medicare Other | Admitting: *Deleted

## 2015-12-31 DIAGNOSIS — I4891 Unspecified atrial fibrillation: Secondary | ICD-10-CM | POA: Diagnosis not present

## 2015-12-31 DIAGNOSIS — Z5181 Encounter for therapeutic drug level monitoring: Secondary | ICD-10-CM | POA: Diagnosis not present

## 2015-12-31 DIAGNOSIS — Z7901 Long term (current) use of anticoagulants: Secondary | ICD-10-CM

## 2015-12-31 LAB — POCT INR: INR: 2.1

## 2016-01-30 ENCOUNTER — Telehealth: Payer: Self-pay | Admitting: Pharmacist

## 2016-01-30 NOTE — Telephone Encounter (Signed)
Received phone call from patient.  Patient reports he is still not interested in starting any osteoporosis medications at this time.  Patient reports he continues to read about his options.  Treatment options were discussed with patient over the phone, but patient states he will defer medications at this time but would like to schedule follow up appointment in the future.  Patient does not have follow up appointment in the system at this time.  Reviewed his chart and noted he was to return to clinic 6 months following his last appointment in August.  Transferred patient to the front desk to schedule his follow up appointment.

## 2016-02-08 ENCOUNTER — Other Ambulatory Visit: Payer: Self-pay | Admitting: Cardiology

## 2016-02-11 ENCOUNTER — Ambulatory Visit (INDEPENDENT_AMBULATORY_CARE_PROVIDER_SITE_OTHER): Payer: Medicare Other | Admitting: *Deleted

## 2016-02-11 DIAGNOSIS — Z5181 Encounter for therapeutic drug level monitoring: Secondary | ICD-10-CM | POA: Diagnosis not present

## 2016-02-11 LAB — POCT INR: INR: 2.1

## 2016-03-18 ENCOUNTER — Emergency Department (HOSPITAL_COMMUNITY): Payer: Medicare Other

## 2016-03-18 ENCOUNTER — Encounter (HOSPITAL_COMMUNITY): Payer: Self-pay | Admitting: Emergency Medicine

## 2016-03-18 ENCOUNTER — Emergency Department (HOSPITAL_COMMUNITY)
Admission: EM | Admit: 2016-03-18 | Discharge: 2016-03-18 | Disposition: A | Discharge status: Home / Self Care | Payer: Medicare Other | Attending: Emergency Medicine | Admitting: Emergency Medicine

## 2016-03-18 DIAGNOSIS — M6281 Muscle weakness (generalized): Secondary | ICD-10-CM | POA: Insufficient documentation

## 2016-03-18 DIAGNOSIS — R791 Abnormal coagulation profile: Secondary | ICD-10-CM | POA: Diagnosis not present

## 2016-03-18 DIAGNOSIS — Z85828 Personal history of other malignant neoplasm of skin: Secondary | ICD-10-CM | POA: Insufficient documentation

## 2016-03-18 DIAGNOSIS — Z79899 Other long term (current) drug therapy: Secondary | ICD-10-CM | POA: Diagnosis not present

## 2016-03-18 DIAGNOSIS — Y929 Unspecified place or not applicable: Secondary | ICD-10-CM | POA: Insufficient documentation

## 2016-03-18 DIAGNOSIS — W19XXXA Unspecified fall, initial encounter: Secondary | ICD-10-CM

## 2016-03-18 DIAGNOSIS — Z87891 Personal history of nicotine dependence: Secondary | ICD-10-CM | POA: Insufficient documentation

## 2016-03-18 DIAGNOSIS — Y939 Activity, unspecified: Secondary | ICD-10-CM | POA: Diagnosis not present

## 2016-03-18 DIAGNOSIS — Y92009 Unspecified place in unspecified non-institutional (private) residence as the place of occurrence of the external cause: Secondary | ICD-10-CM

## 2016-03-18 DIAGNOSIS — R29898 Other symptoms and signs involving the musculoskeletal system: Secondary | ICD-10-CM

## 2016-03-18 DIAGNOSIS — Y999 Unspecified external cause status: Secondary | ICD-10-CM | POA: Insufficient documentation

## 2016-03-18 DIAGNOSIS — W1830XA Fall on same level, unspecified, initial encounter: Secondary | ICD-10-CM | POA: Insufficient documentation

## 2016-03-18 DIAGNOSIS — Z7901 Long term (current) use of anticoagulants: Secondary | ICD-10-CM | POA: Diagnosis not present

## 2016-03-18 DIAGNOSIS — I10 Essential (primary) hypertension: Secondary | ICD-10-CM | POA: Insufficient documentation

## 2016-03-18 DIAGNOSIS — M545 Low back pain: Secondary | ICD-10-CM | POA: Diagnosis present

## 2016-03-18 LAB — PROTIME-INR
INR: 4.05 — AB
Prothrombin Time: 40.4 seconds — ABNORMAL HIGH (ref 11.4–15.2)

## 2016-03-18 NOTE — ED Provider Notes (Signed)
[name] DEPT Provider Note   CSN: [id number] Arrival date & time: 03/18/16  [id number] By signing my name below, I, Ian Sullivan, attest that this documentation has been prepared under the direction and in the presence of Ian Fuel, MD . Electronically Signed: Dyke Sullivan, Scribe. 03/18/2016. 1:54 AM.   History   Chief Complaint Chief Complaint  Patient presents with  . Fall  . Back Pain    HPI Ian Sullivan. is a 82 y.o. male with a hx of A-fib who presents to the Emergency Department complaining of sudden onset, persistent lower back pain onset yesterday s/p fall. Pt states he struck his lower back on his bed yesterday and is now unable to stand on his legs or ambulate. He describes his pain as dull, aching back pain that is exacerbated by movement. No alleviating factors noted. He reports SHx of implanted rods and screws as well as implanted nerve stimulators. Pt states he has chronic numbness to his BLE as well as back pain following his 2nd back surgery. He denies any new bowel or urinary incontinence, LOC, or any other associated symptoms.  The history is provided by the patient. No language interpreter was used.   Past Medical History:  Diagnosis Date  . Anemia   . Anxiety state, unspecified    past at death of spouse  . Atrial fibrillation ([place])   . Barrett's esophagus   . Depressive disorder, not elsewhere classified    past at death of spouse  . Diverticulosis of colon (without mention of hemorrhage)   . Duodenitis without mention of hemorrhage   . Dyskinesia of esophagus   . GERD (gastroesophageal reflux disease)   . Hereditary and idiopathic peripheral neuropathy 10/23/2014  . Hiatal hernia   . Irritable bowel syndrome   . Long term (current) use of anticoagulants   . Other and unspecified hyperlipidemia   . Skin cancer (melanoma) ([place])   . Status post dilation of esophageal narrowing   . Unspecified essential hypertension   . Unspecified gastritis  and gastroduodenitis without mention of hemorrhage   . Unspecified hemorrhoids without mention of complication   . Unspecified sleep apnea     Patient Active Problem List   Diagnosis Date Noted  . S/P sigmoid colectomy June 2017 09/07/2015  . Hypokalemia   . Sigmoid volvulus ([place])   . Chronic constipation   . Thrombocytopenia ([place]) 01/16/2015  . Hereditary and idiopathic peripheral neuropathy 10/23/2014  . Lumbar radiculopathy 04/26/2014  . Encounter for therapeutic drug monitoring 04/20/2013  . Nonspecific (abnormal) findings on radiological and other examination of gastrointestinal tract 11/13/2011  . Pharyngeal dysphagia-mild 10/24/2011  . Prostate mass 10/24/2011  . HYPERGLYCEMIA 01/11/2010  . ATRIAL FIBRILLATION 12/20/2009  . Congestive heart failure ([place]) 12/20/2009  . MITRAL REGURGITATION 01/24/2009  . SLEEP APNEA 07/11/2008  . ESOPHAGEAL MOTILITY DISORDER 06/28/2008  . DYSLIPIDEMIA 06/04/2007  . ANXIETY 06/04/2007  . DEPRESSION 06/04/2007  . Essential hypertension 06/04/2007  . IRRITABLE BOWEL SYNDROME 06/04/2007  . BARRETTS ESOPHAGUS 09/09/2005    Past Surgical History:  Procedure Laterality Date  . APPENDECTOMY  1945  . CARDIAC CATHETERIZATION  1991  . CARPAL TUNNEL RELEASE Bilateral 2003  . central decompressive laminectomy     L2-L5  . COLONOSCOPY    . ESOPHAGOGASTRODUODENOSCOPY    . EUS  11/13/2011   Procedure: LOWER ENDOSCOPIC ULTRASOUND (EUS);  Surgeon: Milus Banister, MD;  Location: [facility];  Service: Endoscopy;  Laterality: N/A;  . FLEXIBLE SIGMOIDOSCOPY N/A 09/04/2015  Procedure: FLEXIBLE SIGMOIDOSCOPY;  Surgeon: Manus Gunning, MD;  Location: [facility];  Service: Gastroenterology;  Laterality: N/A;  . LUMBAR LAMINECTOMY    . NISSEN FUNDOPLICATION  [id number]  . PARTIAL COLECTOMY N/A 09/07/2015   Procedure: OPEN SIGMOID COLECTOMY WITH ANASTAMOSIS;  Surgeon: Johnathan Hausen, MD;  Location: [facility];  Service: General;  Laterality: N/A;  . small  bowel obsturction     x4  . SPINAL CORD STIMULATOR IMPLANT  06/2011   Dr. Anselm Lis  . TONSILLECTOMY    . TURP VAPORIZATION  1980s  . VASECTOMY         Home Medications    Prior to Admission medications   Medication Sig Start Date End Date Taking? Authorizing Provider  acetaminophen (TYLENOL) 325 MG tablet Take 2 tablets (650 mg total) by mouth every 6 (six) hours as needed for mild pain or moderate pain. 09/13/15   Oswald Hillock, MD  amLODipine (NORVASC) 10 MG tablet TAKE 1 TABLET BY MOUTH EVERY DAY 02/08/16   Lelon Perla, MD  Calcium 600 MG tablet Take 1,200 mg by mouth daily. Every other day    Historical Provider, MD  cholecalciferol (VITAMIN D) 1000 UNITS tablet Take 1,000 Units by mouth daily.      Historical Provider, MD  Ferrous Sulfate (IRON) 325 (65 FE) MG TABS Take 1 tablet by mouth daily.      Historical Provider, MD  fish oil-omega-3 fatty acids 1000 MG capsule Take 2 capsules by mouth 2 (two) times daily.     Historical Provider, MD  FLUoxetine (PROZAC) 20 MG tablet Take 20 mg by mouth daily.    Historical Provider, MD  furosemide (LASIX) 20 MG tablet TAKE 1 TABLET (20 MG TOTAL) BY MOUTH DAILY. 11/14/15   Lelon Perla, MD  KLOR-CON M10 10 MEQ tablet TAKE 1 TABLET BY MOUTH EVERY DAY 11/19/15   Lelon Perla, MD  losartan (COZAAR) 100 MG tablet TAKE 1 TABLET (100 MG TOTAL) BY MOUTH DAILY. 11/14/15   Lelon Perla, MD  Magnesium 100 MG CAPS Take 100 mg by mouth daily.     Historical Provider, MD  Multiple Vitamin (MULTIVITAMIN) tablet Take 1 tablet by mouth daily.    Historical Provider, MD  omeprazole (PRILOSEC) 20 MG capsule Take 20 mg by mouth daily.      Historical Provider, MD  pravastatin (PRAVACHOL) 10 MG tablet Take 1 tablet (10 mg total) by mouth daily. 12/28/14   Lelon Perla, MD  vitamin E 400 UNIT capsule Take 400 Units by mouth daily.      Historical Provider, MD  warfarin (COUMADIN) 5 MG tablet Take 1 tablet (5 mg total) by mouth daily at 6 PM. 5mg   daily except 7.5mg  on Tuesday/Thursday 09/13/15   Oswald Hillock, MD    Family History Family History  Problem Relation Age of Onset  . Stroke Mother   . Coronary artery disease Father   . Heart disease Other     [place]  . Colon cancer Neg Hx     Social History Social History  Substance Use Topics  . Smoking status: Former Smoker    Quit date: 03/31/1972  . Smokeless tobacco: Never Used  . Alcohol use 0.6 oz/week    1 Shots of liquor per week     Comment: ocass     Allergies   Horse-derived products   Review of Systems Review of Systems  Gastrointestinal:       Negative for bowel incontinence  Genitourinary:       Negative for urinary incontinence  Musculoskeletal: Positive for back pain.  Neurological: Negative for syncope.  Hematological: Bruises/bleeds easily.  All other systems reviewed and are negative.  Physical Exam Updated Vital Signs BP 139/57 (BP Location: Left Arm)   Pulse 69   Temp 99.3 F (37.4 C) (Oral)   Resp 17   Ht 5\' 11"  (1.803 m)   Wt 181 lb (82.1 kg)   SpO2 93%   BMI 25.24 kg/m   Physical Exam  Constitutional: He is oriented to person, place, and time. He appears well-developed and well-nourished.  HENT:  Head: Normocephalic and atraumatic.  Eyes: EOM are normal. Pupils are equal, round, and reactive to light.  Neck: Normal range of motion. Neck supple. No JVD present.  Cardiovascular: Normal rate, regular rhythm and normal heart sounds.   No murmur heard. Pulmonary/Chest: Effort normal and breath sounds normal. He has no wheezes. He has no rales. He exhibits no tenderness.  Abdominal: Soft. Bowel sounds are normal. He exhibits no distension and no mass. There is no tenderness.  Musculoskeletal: Normal range of motion. He exhibits no edema.  Moderate  venous stasis changes  Lymphadenopathy:    He has no cervical adenopathy.  Neurological: He is alert and oriented to person, place, and time. No cranial nerve deficit. He exhibits normal  muscle tone. Coordination normal.  Decreased sensations in both legs, decrease is symmetric. Strength 5/5 in all muscles of legs.   Skin: Skin is warm and dry. No rash noted.  Psychiatric: He has a normal mood and affect. His behavior is normal. Judgment and thought content normal.  Nursing note and vitals reviewed.   ED Treatments / Results  DIAGNOSTIC STUDIES:  Oxygen Saturation is 93% on RA, low by my interpretation.    COORDINATION OF CARE:  2:00 AM Discussed treatment plan with pt at bedside and pt agreed to plan.  Labs (all labs ordered are listed, but only abnormal results are displayed) Labs Reviewed  PROTIME-INR - Abnormal; Notable for the following:       Result Value   Prothrombin Time 40.4 (*)    INR 4.05 (*)    All other components within normal limits    Radiology Ct Lumbar Spine Wo Contrast  Result Date: 03/18/2016 CLINICAL DATA:  82 year old male status post fall and difficulty walking. History of 2 back surgeries and spinal cord narrowed stimulator implant. Chronic back pain. EXAM: CT LUMBAR SPINE WITHOUT CONTRAST TECHNIQUE: Multidetector CT imaging of the lumbar spine was performed without intravenous contrast administration. Multiplanar CT image reconstructions were also generated. COMPARISON:  Lumbar spine radiograph dated 09/04/2015 and CT of the abdomen pelvis dated 09/03/2015 FINDINGS: Segmentation: 5 lumbar type vertebrae. Alignment: No acute subluxation. There is grade 1 go L3-L4 retrolisthesis similar to prior CT. Vertebrae: There is no acute fracture. The bones are osteopenic. There is laminectomy at L2 -L5. L4-L5 posterior fusion hardware and transpedicular screws noted which appear intact. Paraspinal and other soft tissues: Postsurgical changes of the posterior lower lumbar region. A spinal stimulator is to partially seen. There is no fluid collection. Disc levels: L1-L2: Diffuse disc bulge. There is flattening of the anterior thecal sac with probable  bilateral mild neural foramina narrowing. L2-L3: Diffuse disc bulge with probable mild bilateral neural foramina narrowing, left greater right. There is endplate irregularity at level with osteophyte. L3-L4: Diffuse disc bulge. There is endplate irregularity and disc space narrowing. There is disc osteophyte complex. There is mild narrowing of  the neural foramina. L4-5: There is interbody disc spacer. Evaluation of the neural foramina is limited due to streak artifact. L5-S1: There is disc disease and endplate irregularity. The central canal and neural foramina appear patent. IMPRESSION: No acute fracture or subluxation. Osteopenia with multilevel degenerative changes. Grade 1 L3-L4 retrolisthesis similar to prior CT. L2-L5 laminectomy and posterior fusion hardware at the L4-5. Electronically Signed   By: Anner Crete M.D.   On: 03/18/2016 04:19    Procedures Procedures (including critical care time)  Medications Ordered in ED Medications - No data to display   Initial Impression / Assessment and Plan / ED Course  I have reviewed the triage vital signs and the nursing notes.  Pertinent labs & imaging results that were available during my care of the patient were reviewed by me and considered in my medical decision making (see chart for details).  Clinical Course    Leg weakness following fall inpatient as anticoagulated. Objectively, on exam, I do not see evidence of neurologic injury. He has chronic numbness without any focal component. Objectively, leg strength is normal. However, since she is taking warfarin, it was felt to be prudent to image his spine. MRI could not be done because of neurostimulator. He is sent for CT which shows no evidence of cord compression. He was ambulated with assistance and was able to support his weight well and he stated he was feeling better. He is discharged to follow-up with his PCP. INR was noted to be elevated at 4.05. He is to hold his warfarin dose today  and he is to contact any warfarin clinic for instructions on how to adjust his dose going forward.  Final Clinical Impressions(s) / ED Diagnoses   Final diagnoses:  Fall at home, initial encounter  Leg weakness, bilateral  Elevated INR    New Prescriptions New Prescriptions   No medications on file   I personally performed the services described in this documentation, which was scribed in my presence. The recorded information has been reviewed and is accurate.      Ian Fuel, MD [id number]

## 2016-03-18 NOTE — ED Triage Notes (Signed)
Pt reports hitting lower back on bed on 03/17/16 at appx 1600 and is still having discomfort with movement and pain with ambulation. Pt denies any LOC

## 2016-03-18 NOTE — Discharge Instructions (Signed)
Your INR (blood test for warfarin) was high today - 4.05. Do not take your warfarin today. Call the clinic that monitors your warfarin to get instructions on how to adjust your dose.

## 2016-03-26 ENCOUNTER — Ambulatory Visit (INDEPENDENT_AMBULATORY_CARE_PROVIDER_SITE_OTHER): Payer: Medicare Other | Admitting: *Deleted

## 2016-03-26 DIAGNOSIS — Z5181 Encounter for therapeutic drug level monitoring: Secondary | ICD-10-CM

## 2016-03-26 DIAGNOSIS — I4891 Unspecified atrial fibrillation: Secondary | ICD-10-CM

## 2016-03-26 LAB — POCT INR: INR: 2.9

## 2016-04-11 NOTE — Progress Notes (Signed)
HPI: FU permanent atrial fibrillation. Last echo in August 2016 showed normal LV function, elevated left ventricular filling pressures, mild aortic and mitral regurgitation, severe left atrial enlargement, moderate right atrial enlargement, mild right ventricular enlargement and moderate tricuspid regurgitation. At previous office visit patient noted increased dyspnea. Holter monitor August 2016 showed A. Fib with rate decreased. Carvedilol discontinued. BNP 388. Started on lasix. Patient had sigmoid colectomy because of volvulus in June 2017.Since last seen, he notes some dyspnea on exertion, occasional orthopnea and occasional pedal edema. No exertional chest pain or syncope.  Current Outpatient Prescriptions  Medication Sig Dispense Refill  . acetaminophen (TYLENOL) 325 MG tablet Take 2 tablets (650 mg total) by mouth every 6 (six) hours as needed for mild pain or moderate pain. 30 tablet 2  . amLODipine (NORVASC) 10 MG tablet TAKE 1 TABLET BY MOUTH EVERY DAY 90 tablet 3  . Calcium 600 MG tablet Take 1,200 mg by mouth daily. Every other day    . cholecalciferol (VITAMIN D) 1000 UNITS tablet Take 1,000 Units by mouth daily.      . Ferrous Sulfate (IRON) 325 (65 FE) MG TABS Take 1 tablet by mouth daily.      . fish oil-omega-3 fatty acids 1000 MG capsule Take 2 capsules by mouth 2 (two) times daily.     Marland Kitchen FLUoxetine (PROZAC) 20 MG tablet Take 20 mg by mouth daily.    . furosemide (LASIX) 20 MG tablet TAKE 1 TABLET (20 MG TOTAL) BY MOUTH DAILY. 90 tablet 3  . KLOR-CON M10 10 MEQ tablet TAKE 1 TABLET BY MOUTH EVERY DAY 30 tablet 6  . losartan (COZAAR) 100 MG tablet TAKE 1 TABLET (100 MG TOTAL) BY MOUTH DAILY. 90 tablet 3  . Magnesium 100 MG CAPS Take 100 mg by mouth daily.     . Multiple Vitamin (MULTIVITAMIN) tablet Take 1 tablet by mouth daily.    . vitamin E 400 UNIT capsule Take 400 Units by mouth daily.      Marland Kitchen warfarin (COUMADIN) 5 MG tablet Take 1 tablet (5 mg total) by mouth daily at  6 PM. 5mg  daily except 7.5mg  on Tuesday/Thursday 40 tablet 3   No current facility-administered medications for this visit.      Past Medical History:  Diagnosis Date  . Anemia   . Anxiety state, unspecified    past at death of spouse  . Atrial fibrillation ([place])   . Barrett's esophagus   . Depressive disorder, not elsewhere classified    past at death of spouse  . Diverticulosis of colon (without mention of hemorrhage)   . Duodenitis without mention of hemorrhage   . Dyskinesia of esophagus   . GERD (gastroesophageal reflux disease)   . Hereditary and idiopathic peripheral neuropathy 10/23/2014  . Hiatal hernia   . Irritable bowel syndrome   . Long term (current) use of anticoagulants   . Other and unspecified hyperlipidemia   . Skin cancer (melanoma) ([place])   . Status post dilation of esophageal narrowing   . Unspecified essential hypertension   . Unspecified gastritis and gastroduodenitis without mention of hemorrhage   . Unspecified hemorrhoids without mention of complication   . Unspecified sleep apnea     Past Surgical History:  Procedure Laterality Date  . APPENDECTOMY  1945  . CARDIAC CATHETERIZATION  1991  . CARPAL TUNNEL RELEASE Bilateral 2003  . central decompressive laminectomy     L2-L5  . COLONOSCOPY    . ESOPHAGOGASTRODUODENOSCOPY    .  EUS  11/13/2011   Procedure: LOWER ENDOSCOPIC ULTRASOUND (EUS);  Surgeon: Milus Banister, MD;  Location: [facility];  Service: Endoscopy;  Laterality: N/A;  . FLEXIBLE SIGMOIDOSCOPY N/A 09/04/2015   Procedure: FLEXIBLE SIGMOIDOSCOPY;  Surgeon: Manus Gunning, MD;  Location: [facility];  Service: Gastroenterology;  Laterality: N/A;  . LUMBAR LAMINECTOMY    . NISSEN FUNDOPLICATION  [id number]  . PARTIAL COLECTOMY N/A 09/07/2015   Procedure: OPEN SIGMOID COLECTOMY WITH ANASTAMOSIS;  Surgeon: Johnathan Hausen, MD;  Location: [facility];  Service: General;  Laterality: N/A;  . small bowel obsturction     x4  . SPINAL CORD  STIMULATOR IMPLANT  06/2011   Dr. Anselm Lis  . TONSILLECTOMY    . TURP VAPORIZATION  1980s  . VASECTOMY      Social History   Social History  . Marital status: Widowed    Spouse name: N/A  . Number of children: 1  . Years of education: N/A   Occupational History  . retired Retired   Social History Main Topics  . Smoking status: Former Smoker    Quit date: 03/31/1972  . Smokeless tobacco: Never Used  . Alcohol use 0.6 oz/week    1 Shots of liquor per week     Comment: ocass  . Drug use: No  . Sexual activity: Not on file   Other Topics Concern  . Not on file   Social History Narrative   Alcohol use- yes occasional    Family History  Problem Relation Age of Onset  . Stroke Mother   . Coronary artery disease Father   . Heart disease Other     [place]  . Colon cancer Neg Hx     [id number] and leg pain but no fevers or chills, productive cough, hemoptysis, dysphasia, odynophagia, melena, hematochezia, dysuria, hematuria, rash, seizure activity,claudication. Remaining systems are negative.  Physical Exam: Well-developed well-nourished in no acute distress.  Skin is warm and dry.  HEENT is normal.  Neck is supple.  Chest is clear to auscultation with normal expansion.  Cardiovascular exam is irregular Abdominal exam nontender or distended. No masses palpated. Extremities show 1+ edema. neuro grossly intact  ECG-Atrial fibrillation at a rate of 62. Right bundle branch block.  A/P  1 atrial fibrillation-heart rate controlled on no medications. Continue Coumadin. Patient is not interested in [place].  2 hypertension-blood pressure controlled. Continue present medications.  3 hyperlipidemia-continue statin.  4 chronic diastolic heart failure-he notes some dyspnea on exertion, orthopnea and pedal edema. I will increase Lasix to 40 mg daily. In 1 week check potassium and renal function.  Kirk Ruths, MD

## 2016-04-15 ENCOUNTER — Other Ambulatory Visit (INDEPENDENT_AMBULATORY_CARE_PROVIDER_SITE_OTHER): Payer: Self-pay | Admitting: Specialist

## 2016-04-21 ENCOUNTER — Ambulatory Visit (INDEPENDENT_AMBULATORY_CARE_PROVIDER_SITE_OTHER): Payer: Medicare Other | Admitting: Cardiology

## 2016-04-21 ENCOUNTER — Encounter: Payer: Self-pay | Admitting: Cardiology

## 2016-04-21 VITALS — BP 146/72 | HR 62 | Ht 71.0 in | Wt 180.0 lb

## 2016-04-21 DIAGNOSIS — I4891 Unspecified atrial fibrillation: Secondary | ICD-10-CM

## 2016-04-21 DIAGNOSIS — I5032 Chronic diastolic (congestive) heart failure: Secondary | ICD-10-CM

## 2016-04-21 DIAGNOSIS — I10 Essential (primary) hypertension: Secondary | ICD-10-CM | POA: Diagnosis not present

## 2016-04-21 MED ORDER — FUROSEMIDE 40 MG PO TABS: 40.0000 mg | ORAL_TABLET | Freq: Every day | ORAL | 3 refills | Status: AC

## 2016-04-21 NOTE — Patient Instructions (Signed)
Medication Instructions:   INCREASE FUROSEMIDE TO 40 MG ONCE DAILY= 2 OF THE 20 MG TABLETS ONCE DAILY  Labwork:  Your physician recommends that you return for lab work in: [place]:  Your physician wants you to follow-up in: [place] will receive a reminder letter in the mail two months in advance. If you don't receive a letter, please call our office to schedule the follow-up appointment.   If you need a refill on your cardiac medications before your next appointment, please call your pharmacy.

## 2016-04-29 ENCOUNTER — Other Ambulatory Visit: Payer: Self-pay | Admitting: Cardiology

## 2016-04-29 NOTE — Telephone Encounter (Signed)
Rx(s) sent to pharmacy electronically.  

## 2016-04-30 LAB — BASIC METABOLIC PANEL
BUN: 20 mg/dL (ref 7–25)
CALCIUM: 9.9 mg/dL (ref 8.6–10.3)
CO2: 28 mmol/L (ref 20–31)
Chloride: 103 mmol/L (ref 98–110)
Creat: 0.95 mg/dL (ref 0.70–1.11)
Glucose, Bld: 128 mg/dL — ABNORMAL HIGH (ref 65–99)
Potassium: 4.1 mmol/L (ref 3.5–5.3)
SODIUM: 139 mmol/L (ref 135–146)

## 2016-05-07 ENCOUNTER — Ambulatory Visit (INDEPENDENT_AMBULATORY_CARE_PROVIDER_SITE_OTHER): Payer: Medicare Other | Admitting: *Deleted

## 2016-05-07 DIAGNOSIS — Z5181 Encounter for therapeutic drug level monitoring: Secondary | ICD-10-CM | POA: Diagnosis not present

## 2016-05-07 DIAGNOSIS — I4891 Unspecified atrial fibrillation: Secondary | ICD-10-CM | POA: Diagnosis not present

## 2016-05-07 LAB — POCT INR: INR: 2.6

## 2016-05-15 ENCOUNTER — Ambulatory Visit (INDEPENDENT_AMBULATORY_CARE_PROVIDER_SITE_OTHER): Payer: Medicare Other | Admitting: Specialist

## 2016-05-15 ENCOUNTER — Encounter (INDEPENDENT_AMBULATORY_CARE_PROVIDER_SITE_OTHER): Payer: Self-pay | Admitting: Specialist

## 2016-05-15 VITALS — BP 149/74 | HR 60 | Ht 71.5 in | Wt 180.0 lb

## 2016-05-15 DIAGNOSIS — M48062 Spinal stenosis, lumbar region with neurogenic claudication: Secondary | ICD-10-CM | POA: Diagnosis not present

## 2016-05-15 DIAGNOSIS — M4316 Spondylolisthesis, lumbar region: Secondary | ICD-10-CM

## 2016-05-15 DIAGNOSIS — R29898 Other symptoms and signs involving the musculoskeletal system: Secondary | ICD-10-CM | POA: Diagnosis not present

## 2016-05-15 DIAGNOSIS — G894 Chronic pain syndrome: Secondary | ICD-10-CM

## 2016-05-15 DIAGNOSIS — M4156 Other secondary scoliosis, lumbar region: Secondary | ICD-10-CM | POA: Diagnosis not present

## 2016-05-15 MED ORDER — HYDROCODONE-ACETAMINOPHEN 5-325 MG PO TABS: 1.0000 | ORAL_TABLET | Freq: Four times a day (QID) | ORAL | 0 refills | Status: AC | PRN

## 2016-05-15 NOTE — Progress Notes (Addendum)
Office Visit Note   Patient: Ian Sullivan.           Date of Birth: 1927/11/01           MRN: [id number] Visit Date: 05/15/2016              Requested by: [name], MD [id number] [place], [place] [address] PCP: [name], MD   Assessment & Plan: Visit Diagnoses:  1. Weakness of both legs   2. Spinal stenosis of lumbar region with neurogenic claudication   3. Other secondary scoliosis, lumbar region   4. Spondylolisthesis of lumbar region   5. Chronic pain syndrome   this patient is a 83 year old male had problems rog due to spondylolisthesis at L5 eventually  Undergoing an L4-5 decompression and fusion postoperativelye eventually developed adjacent level changes at the L2-3 and L3-4 level with a progressive degenerative collapsing scoliosis above the L4-5 segment. Return to the operating room and underwent decompression at the L3-4 level.he has had persistent pai likely due to the right L3 and L2 nerve entrapment with worsening of scoliosis and lateral listhesis at L3-4. He had a spinal cord stimulator placed which has decreased his use of narcotic medicines in general and improved his overall function.e  He uses a cane for ambulation notices increasing lower extremity weakness,his EMG is and nerve conduction studies have shown some residual left and right-sided L5 and S1 radiculopathy. Follow-up studies of the lumbar spine does show no significant nerve compression below the L4-5 level he does have retrolisthesis of L3-4 and stenosis at this segment that persists and it would be best treated with decompression and fusion of both L2-3 and L3-4 levels. At age 70 with a history of atrial fibrillation chronic coumadinization Unfortunately has increased risks of  morbidity secondary to medical concerns.He has the indwelling spinal cord stimulator so that for now I think will continue. He reports being able to function that I think he is able to remain fairly independent at age 39 but I  think his decision to consider assisted living facility is a wise decision.he does request med for use at night to help with sleep with pain I prescribed hydrocodone. Unfortunately tramadol would most likely interact with his Prozac  Plan: Avoid bending, stooping and avoid lifting weights greater than 10 lbs. Avoid prolong standing and walking. Continue with a cane for prolong standing. Avoid frequent bending and stooping  No lifting greater than 10 lbs. May use ice or moist heat for pain. Weight loss is of benefit. Handicap license is approved.   Follow-Up Instructions: Return in about 6 months (around 11/12/2016).   Orders:  No orders of the defined types were placed in this encounter.  Meds ordered this encounter  Medications  . HYDROcodone-acetaminophen (NORCO/VICODIN) 5-325 MG tablet    Sig: Take 1 tablet by mouth every 6 (six) hours as needed for moderate pain.    Dispense:  50 tablet    Refill:  0      Procedures: No procedures performed   Clinical Data: No additional findings.   Subjective: Chief Complaint  Patient presents with  . Lower Back - Follow-up    Mr. Diffenderfer is here 6 month recheck on his back. He says that he is not doing well it, that his legs feel weaker. Complains of pain in the lower lumbar area transverse with feeling of fatigue, With leg pain not as bad and the indwelling spinal cord stimulator helps when leg pain is present. Looking into  assisted living in [place], [place]. Can only walk with a cart for 30-78min at [facility] or other shopping facilities. Stepping off a curb with feeling of weakness in the right knee. Also left leg pain and weakness.     Review of Systems  Constitutional: Negative.   HENT: Negative.   Eyes: Negative.   Respiratory: Negative.   Cardiovascular: Negative.   Gastrointestinal: Negative.   Endocrine: Negative.   Genitourinary: Negative.   Musculoskeletal: Negative.   Skin: Negative.   Allergic/Immunologic: Negative.     Neurological: Negative.   Hematological: Negative.   Psychiatric/Behavioral: Negative.      Objective: Vital Signs: BP (!) 149/74 (BP Location: Left Arm, Patient Position: Sitting)   Pulse 60   Ht 5' 11.5" (1.816 m)   Wt 180 lb (81.6 kg)   BMI 24.76 kg/m   Physical Exam  Back Exam   Tenderness  The patient is experiencing tenderness in the lumbar.  Range of Motion  Extension: normal  Flexion: normal  Lateral Bend Right: normal  Lateral Bend Left: normal  Rotation Right: normal  Rotation Left: normal   Muscle Strength  Right Quadriceps:  4/5  Left Quadriceps:  5/5  Right Hamstrings:  4/5  Left Hamstrings:  4/5   Tests  Straight leg raise right: negative Straight leg raise left: negative  Reflexes  Patellar: 0/4 Achilles: 0/4 Biceps: 2/4 Babinski's sign: normal   Other  Toe Walk: abnormal Heel Walk: abnormal Sensation: decreased Gait: normal  Erythema: no back redness Scars: absent      Specialty Comments:  No specialty comments available.  Imaging: No results found.   PMFS History: Patient Active Problem List   Diagnosis Date Noted  . S/P sigmoid colectomy June 2017 09/07/2015  . Hypokalemia   . Sigmoid volvulus ([place])   . Chronic constipation   . Thrombocytopenia ([place]) 01/16/2015  . Hereditary and idiopathic peripheral neuropathy 10/23/2014  . Lumbar radiculopathy 04/26/2014  . Encounter for therapeutic drug monitoring 04/20/2013  . Nonspecific (abnormal) findings on radiological and other examination of gastrointestinal tract 11/13/2011  . Pharyngeal dysphagia-mild 10/24/2011  . Prostate mass 10/24/2011  . HYPERGLYCEMIA 01/11/2010  . ATRIAL FIBRILLATION 12/20/2009  . Congestive heart failure ([place]) 12/20/2009  . MITRAL REGURGITATION 01/24/2009  . SLEEP APNEA 07/11/2008  . ESOPHAGEAL MOTILITY DISORDER 06/28/2008  . DYSLIPIDEMIA 06/04/2007  . ANXIETY 06/04/2007  . DEPRESSION 06/04/2007  . Essential hypertension 06/04/2007  .  IRRITABLE BOWEL SYNDROME 06/04/2007  . BARRETTS ESOPHAGUS 09/09/2005   Past Medical History:  Diagnosis Date  . Anemia   . Anxiety state, unspecified    past at death of spouse  . Atrial fibrillation ([place])   . Barrett's esophagus   . Depressive disorder, not elsewhere classified    past at death of spouse  . Diverticulosis of colon (without mention of hemorrhage)   . Duodenitis without mention of hemorrhage   . Dyskinesia of esophagus   . GERD (gastroesophageal reflux disease)   . Hereditary and idiopathic peripheral neuropathy 10/23/2014  . Hiatal hernia   . Irritable bowel syndrome   . Long term (current) use of anticoagulants   . Other and unspecified hyperlipidemia   . Skin cancer (melanoma) ([place])   . Status post dilation of esophageal narrowing   . Unspecified essential hypertension   . Unspecified gastritis and gastroduodenitis without mention of hemorrhage   . Unspecified hemorrhoids without mention of complication   . Unspecified sleep apnea     Family History  Problem Relation Age of Onset  .  Stroke Mother   . Coronary artery disease Father   . Heart disease Other     [place]  . Colon cancer Neg Hx     Past Surgical History:  Procedure Laterality Date  . APPENDECTOMY  1945  . CARDIAC CATHETERIZATION  1991  . CARPAL TUNNEL RELEASE Bilateral 2003  . central decompressive laminectomy     L2-L5  . COLONOSCOPY    . ESOPHAGOGASTRODUODENOSCOPY    . EUS  11/13/2011   Procedure: LOWER ENDOSCOPIC ULTRASOUND (EUS);  Surgeon: Milus Banister, MD;  Location: [facility];  Service: Endoscopy;  Laterality: N/A;  . FLEXIBLE SIGMOIDOSCOPY N/A 09/04/2015   Procedure: FLEXIBLE SIGMOIDOSCOPY;  Surgeon: Manus Gunning, MD;  Location: [facility];  Service: Gastroenterology;  Laterality: N/A;  . LUMBAR LAMINECTOMY    . NISSEN FUNDOPLICATION  [id number]  . PARTIAL COLECTOMY N/A 09/07/2015   Procedure: OPEN SIGMOID COLECTOMY WITH ANASTAMOSIS;  Surgeon: Johnathan Hausen, MD;  Location: [facility];  Service: General;  Laterality: N/A;  . small bowel obsturction     x4  . SPINAL CORD STIMULATOR IMPLANT  06/2011   Dr. Anselm Lis  . TONSILLECTOMY    . TURP VAPORIZATION  1980s  . VASECTOMY     Social History   Occupational History  . retired Retired   Social History Main Topics  . Smoking status: Former Smoker    Quit date: 03/31/1972  . Smokeless tobacco: Never Used  . Alcohol use 0.6 oz/week    1 Shots of liquor per week     Comment: ocass  . Drug use: No  . Sexual activity: Not on file

## 2016-05-15 NOTE — Patient Instructions (Signed)
Avoid bending, stooping and avoid lifting weights greater than 10 lbs. Avoid prolong standing and walking. Avoid frequent bending and stooping  No lifting greater than 10 lbs. May use ice or moist heat for pain. Weight loss is of benefit. Handicap license is approved.  

## 2016-05-17 ENCOUNTER — Other Ambulatory Visit: Payer: Self-pay | Admitting: Cardiology

## 2016-06-06 ENCOUNTER — Inpatient Hospital Stay (HOSPITAL_COMMUNITY)
Admission: EM | Admit: 2016-06-06 | Discharge: 2016-06-29 | DRG: 913 | Disposition: E | Payer: Medicare Other | Attending: Family Medicine | Admitting: Family Medicine

## 2016-06-06 ENCOUNTER — Inpatient Hospital Stay (HOSPITAL_COMMUNITY): Payer: Medicare Other

## 2016-06-06 ENCOUNTER — Encounter (HOSPITAL_COMMUNITY): Payer: Self-pay

## 2016-06-06 DIAGNOSIS — Z85820 Personal history of malignant melanoma of skin: Secondary | ICD-10-CM

## 2016-06-06 DIAGNOSIS — S36529A Contusion of unspecified part of colon, initial encounter: Secondary | ICD-10-CM | POA: Diagnosis present

## 2016-06-06 DIAGNOSIS — K921 Melena: Secondary | ICD-10-CM | POA: Diagnosis present

## 2016-06-06 DIAGNOSIS — Z9889 Other specified postprocedural states: Secondary | ICD-10-CM | POA: Diagnosis not present

## 2016-06-06 DIAGNOSIS — Z9689 Presence of other specified functional implants: Secondary | ICD-10-CM | POA: Diagnosis present

## 2016-06-06 DIAGNOSIS — I110 Hypertensive heart disease with heart failure: Secondary | ICD-10-CM | POA: Diagnosis present

## 2016-06-06 DIAGNOSIS — R578 Other shock: Secondary | ICD-10-CM | POA: Diagnosis present

## 2016-06-06 DIAGNOSIS — Z823 Family history of stroke: Secondary | ICD-10-CM

## 2016-06-06 DIAGNOSIS — I4891 Unspecified atrial fibrillation: Secondary | ICD-10-CM | POA: Diagnosis present

## 2016-06-06 DIAGNOSIS — S36892A Contusion of other intra-abdominal organs, initial encounter: Principal | ICD-10-CM | POA: Diagnosis present

## 2016-06-06 DIAGNOSIS — Y95 Nosocomial condition: Secondary | ICD-10-CM | POA: Diagnosis present

## 2016-06-06 DIAGNOSIS — I5032 Chronic diastolic (congestive) heart failure: Secondary | ICD-10-CM | POA: Diagnosis present

## 2016-06-06 DIAGNOSIS — M549 Dorsalgia, unspecified: Secondary | ICD-10-CM | POA: Diagnosis present

## 2016-06-06 DIAGNOSIS — R935 Abnormal findings on diagnostic imaging of other abdominal regions, including retroperitoneum: Secondary | ICD-10-CM | POA: Diagnosis not present

## 2016-06-06 DIAGNOSIS — J181 Lobar pneumonia, unspecified organism: Secondary | ICD-10-CM

## 2016-06-06 DIAGNOSIS — Z7901 Long term (current) use of anticoagulants: Secondary | ICD-10-CM

## 2016-06-06 DIAGNOSIS — D696 Thrombocytopenia, unspecified: Secondary | ICD-10-CM | POA: Diagnosis present

## 2016-06-06 DIAGNOSIS — I469 Cardiac arrest, cause unspecified: Secondary | ICD-10-CM | POA: Diagnosis present

## 2016-06-06 DIAGNOSIS — R079 Chest pain, unspecified: Secondary | ICD-10-CM | POA: Diagnosis not present

## 2016-06-06 DIAGNOSIS — K625 Hemorrhage of anus and rectum: Secondary | ICD-10-CM | POA: Diagnosis present

## 2016-06-06 DIAGNOSIS — D62 Acute posthemorrhagic anemia: Secondary | ICD-10-CM | POA: Diagnosis present

## 2016-06-06 DIAGNOSIS — K661 Hemoperitoneum: Secondary | ICD-10-CM | POA: Diagnosis not present

## 2016-06-06 DIAGNOSIS — K219 Gastro-esophageal reflux disease without esophagitis: Secondary | ICD-10-CM | POA: Diagnosis present

## 2016-06-06 DIAGNOSIS — R001 Bradycardia, unspecified: Secondary | ICD-10-CM | POA: Diagnosis present

## 2016-06-06 DIAGNOSIS — Z9049 Acquired absence of other specified parts of digestive tract: Secondary | ICD-10-CM | POA: Diagnosis not present

## 2016-06-06 DIAGNOSIS — Z79899 Other long term (current) drug therapy: Secondary | ICD-10-CM | POA: Diagnosis not present

## 2016-06-06 DIAGNOSIS — K922 Gastrointestinal hemorrhage, unspecified: Secondary | ICD-10-CM | POA: Diagnosis present

## 2016-06-06 DIAGNOSIS — R140 Abdominal distension (gaseous): Secondary | ICD-10-CM

## 2016-06-06 DIAGNOSIS — F32A Depression, unspecified: Secondary | ICD-10-CM | POA: Diagnosis present

## 2016-06-06 DIAGNOSIS — R109 Unspecified abdominal pain: Secondary | ICD-10-CM

## 2016-06-06 DIAGNOSIS — K659 Peritonitis, unspecified: Secondary | ICD-10-CM | POA: Diagnosis present

## 2016-06-06 DIAGNOSIS — Z87891 Personal history of nicotine dependence: Secondary | ICD-10-CM | POA: Diagnosis not present

## 2016-06-06 DIAGNOSIS — G4730 Sleep apnea, unspecified: Secondary | ICD-10-CM | POA: Diagnosis present

## 2016-06-06 DIAGNOSIS — G9340 Encephalopathy, unspecified: Secondary | ICD-10-CM | POA: Diagnosis present

## 2016-06-06 DIAGNOSIS — I10 Essential (primary) hypertension: Secondary | ICD-10-CM

## 2016-06-06 DIAGNOSIS — Z66 Do not resuscitate: Secondary | ICD-10-CM | POA: Diagnosis present

## 2016-06-06 DIAGNOSIS — R1030 Lower abdominal pain, unspecified: Secondary | ICD-10-CM

## 2016-06-06 DIAGNOSIS — R0602 Shortness of breath: Secondary | ICD-10-CM

## 2016-06-06 DIAGNOSIS — J189 Pneumonia, unspecified organism: Secondary | ICD-10-CM | POA: Diagnosis present

## 2016-06-06 DIAGNOSIS — D649 Anemia, unspecified: Secondary | ICD-10-CM

## 2016-06-06 DIAGNOSIS — J9811 Atelectasis: Secondary | ICD-10-CM | POA: Diagnosis present

## 2016-06-06 DIAGNOSIS — F329 Major depressive disorder, single episode, unspecified: Secondary | ICD-10-CM | POA: Diagnosis present

## 2016-06-06 DIAGNOSIS — Z978 Presence of other specified devices: Secondary | ICD-10-CM

## 2016-06-06 DIAGNOSIS — Z8249 Family history of ischemic heart disease and other diseases of the circulatory system: Secondary | ICD-10-CM | POA: Diagnosis not present

## 2016-06-06 DIAGNOSIS — G8929 Other chronic pain: Secondary | ICD-10-CM | POA: Diagnosis present

## 2016-06-06 DIAGNOSIS — I482 Chronic atrial fibrillation: Secondary | ICD-10-CM | POA: Diagnosis present

## 2016-06-06 DIAGNOSIS — S36529D Contusion of unspecified part of colon, subsequent encounter: Secondary | ICD-10-CM | POA: Diagnosis not present

## 2016-06-06 DIAGNOSIS — Z5181 Encounter for therapeutic drug level monitoring: Secondary | ICD-10-CM

## 2016-06-06 LAB — CBC
HCT: 32.7 % — ABNORMAL LOW (ref 39.0–52.0)
HCT: 28.4 % — ABNORMAL LOW (ref 39.0–52.0)
HEMATOCRIT: 24.6 % — AB (ref 39.0–52.0)
HEMOGLOBIN: 9.7 g/dL — AB (ref 13.0–17.0)
HEMOGLOBIN: 8.5 g/dL — AB (ref 13.0–17.0)
Hemoglobin: 11.2 g/dL — ABNORMAL LOW (ref 13.0–17.0)
MCH: 32.2 pg (ref 26.0–34.0)
MCH: 32.3 pg (ref 26.0–34.0)
MCH: 32.8 pg (ref 26.0–34.0)
MCHC: 34.3 g/dL (ref 30.0–36.0)
MCHC: 34.2 g/dL (ref 30.0–36.0)
MCHC: 34.6 g/dL (ref 30.0–36.0)
MCV: 94 fL (ref 78.0–100.0)
MCV: 94.7 fL (ref 78.0–100.0)
MCV: 95 fL (ref 78.0–100.0)
PLATELETS: 183 10*3/uL (ref 150–400)
Platelets: 171 10*3/uL (ref 150–400)
Platelets: 146 10*3/uL — ABNORMAL LOW (ref 150–400)
RBC: 3.48 MIL/uL — AB (ref 4.22–5.81)
RBC: 3 MIL/uL — AB (ref 4.22–5.81)
RBC: 2.59 MIL/uL — AB (ref 4.22–5.81)
RDW: 14.3 % (ref 11.5–15.5)
RDW: 14.2 % (ref 11.5–15.5)
RDW: 14.2 % (ref 11.5–15.5)
WBC: 9.2 10*3/uL (ref 4.0–10.5)
WBC: 9.5 10*3/uL (ref 4.0–10.5)
WBC: 11 10*3/uL — AB (ref 4.0–10.5)

## 2016-06-06 LAB — PROTIME-INR
INR: 2.72
INR: 2.68
INR: 1.57
PROTHROMBIN TIME: 18.9 s — AB (ref 11.4–15.2)
Prothrombin Time: 29.4 seconds — ABNORMAL HIGH (ref 11.4–15.2)
Prothrombin Time: 29.1 seconds — ABNORMAL HIGH (ref 11.4–15.2)

## 2016-06-06 LAB — CBC WITH DIFFERENTIAL/PLATELET
BASOS PCT: 0 %
Basophils Absolute: 0 10*3/uL (ref 0.0–0.1)
EOS ABS: 0.1 10*3/uL (ref 0.0–0.7)
Eosinophils Relative: 1 %
HCT: 34.7 % — ABNORMAL LOW (ref 39.0–52.0)
Hemoglobin: 11.7 g/dL — ABNORMAL LOW (ref 13.0–17.0)
Lymphocytes Relative: 14 %
Lymphs Abs: 1.1 10*3/uL (ref 0.7–4.0)
MCH: 31.9 pg (ref 26.0–34.0)
MCHC: 33.7 g/dL (ref 30.0–36.0)
MCV: 94.6 fL (ref 78.0–100.0)
MONO ABS: 0.4 10*3/uL (ref 0.1–1.0)
MONOS PCT: 5 %
Neutro Abs: 6.7 10*3/uL (ref 1.7–7.7)
Neutrophils Relative %: 80 %
Platelets: 181 10*3/uL (ref 150–400)
RBC: 3.67 MIL/uL — ABNORMAL LOW (ref 4.22–5.81)
RDW: 14.2 % (ref 11.5–15.5)
WBC: 8.3 10*3/uL (ref 4.0–10.5)

## 2016-06-06 LAB — BASIC METABOLIC PANEL
ANION GAP: 7 (ref 5–15)
Anion gap: 10 (ref 5–15)
BUN: 24 mg/dL — ABNORMAL HIGH (ref 6–20)
BUN: 26 mg/dL — ABNORMAL HIGH (ref 6–20)
CALCIUM: 9.3 mg/dL (ref 8.9–10.3)
CALCIUM: 9.4 mg/dL (ref 8.9–10.3)
CO2: 25 mmol/L (ref 22–32)
CO2: 27 mmol/L (ref 22–32)
CREATININE: 1.14 mg/dL (ref 0.61–1.24)
Chloride: 105 mmol/L (ref 101–111)
Chloride: 107 mmol/L (ref 101–111)
Creatinine, Ser: 0.96 mg/dL (ref 0.61–1.24)
GFR calc Af Amer: >60 mL/min (ref >60)
GFR calc non Af Amer: 56 mL/min — ABNORMAL LOW (ref >60)
GLUCOSE: 131 mg/dL — AB (ref 65–99)
Glucose, Bld: 110 mg/dL — ABNORMAL HIGH (ref 65–99)
POTASSIUM: 4.3 mmol/L (ref 3.5–5.1)
Potassium: 3.9 mmol/L (ref 3.5–5.1)
Sodium: 140 mmol/L (ref 135–145)
Sodium: 141 mmol/L (ref 135–145)

## 2016-06-06 LAB — MRSA PCR SCREENING: MRSA by PCR: NEGATIVE

## 2016-06-06 LAB — LIPASE, BLOOD: LIPASE: 28 U/L (ref 11–51)

## 2016-06-06 LAB — POC OCCULT BLOOD, ED: Fecal Occult Bld: POSITIVE — AB

## 2016-06-06 LAB — PREPARE RBC (CROSSMATCH)

## 2016-06-06 LAB — BRAIN NATRIURETIC PEPTIDE: B Natriuretic Peptide: 161 pg/mL — ABNORMAL HIGH (ref 0.0–100.0)

## 2016-06-06 LAB — GLUCOSE, CAPILLARY: GLUCOSE-CAPILLARY: 123 mg/dL — AB (ref 65–99)

## 2016-06-06 MED ORDER — FERROUS SULFATE 325 (65 FE) MG PO TABS
325.0000 mg | ORAL_TABLET | Freq: Every day | ORAL | Status: DC
  Administered 2016-06-07 – 2016-06-10 (×4): 325 mg via ORAL
  Filled 2016-06-06 (×4): qty 1

## 2016-06-06 MED ORDER — OMEGA-3-ACID ETHYL ESTERS 1 G PO CAPS
1.0000 | ORAL_CAPSULE | Freq: Two times a day (BID) | ORAL | Status: DC
  Administered 2016-06-06 – 2016-06-10 (×8): 1 g via ORAL
  Filled 2016-06-06 (×11): qty 1

## 2016-06-06 MED ORDER — VITAMIN K1 10 MG/ML IJ SOLN
10.0000 mg | Freq: Once | INTRAVENOUS | Status: AC
  Administered 2016-06-06: 10 mg via INTRAVENOUS
  Filled 2016-06-06: qty 1

## 2016-06-06 MED ORDER — MAGNESIUM OXIDE 400 (241.3 MG) MG PO TABS
200.0000 mg | ORAL_TABLET | Freq: Every day | ORAL | Status: DC
  Administered 2016-06-07 – 2016-06-10 (×4): 200 mg via ORAL
  Filled 2016-06-06 (×4): qty 1

## 2016-06-06 MED ORDER — ONDANSETRON HCL 4 MG/2ML IJ SOLN
4.0000 mg | Freq: Three times a day (TID) | INTRAMUSCULAR | Status: DC | PRN
  Administered 2016-06-06: 4 mg via INTRAVENOUS
  Filled 2016-06-06: qty 2

## 2016-06-06 MED ORDER — FLUOXETINE HCL 20 MG PO CAPS
20.0000 mg | ORAL_CAPSULE | Freq: Every day | ORAL | Status: DC
  Administered 2016-06-07 – 2016-06-10 (×4): 20 mg via ORAL
  Filled 2016-06-06 (×4): qty 1

## 2016-06-06 MED ORDER — SODIUM CHLORIDE 0.9 % IV SOLN
8.0000 mg/h | INTRAVENOUS | Status: DC
  Administered 2016-06-06 – 2016-06-07 (×3): 8 mg/h via INTRAVENOUS
  Filled 2016-06-06 (×6): qty 80

## 2016-06-06 MED ORDER — VITAMIN D3 25 MCG (1000 UNIT) PO TABS
1000.0000 [IU] | ORAL_TABLET | Freq: Every day | ORAL | Status: DC
  Administered 2016-06-07 – 2016-06-10 (×4): 1000 [IU] via ORAL
  Filled 2016-06-06 (×4): qty 1

## 2016-06-06 MED ORDER — MORPHINE SULFATE (PF) 4 MG/ML IV SOLN
2.0000 mg | INTRAVENOUS | Status: DC | PRN
  Administered 2016-06-06 – 2016-06-07 (×4): 2 mg via INTRAVENOUS
  Filled 2016-06-06 (×4): qty 1

## 2016-06-06 MED ORDER — ADULT MULTIVITAMIN W/MINERALS CH
1.0000 | ORAL_TABLET | Freq: Every day | ORAL | Status: DC
  Administered 2016-06-07 – 2016-06-10 (×4): 1 via ORAL
  Filled 2016-06-06 (×4): qty 1

## 2016-06-06 MED ORDER — IOPAMIDOL (ISOVUE-300) INJECTION 61%
INTRAVENOUS | Status: AC
  Administered 2016-06-06: 100 mL via INTRAVENOUS
  Filled 2016-06-06: qty 100

## 2016-06-06 MED ORDER — ZOLPIDEM TARTRATE 5 MG PO TABS: 5.0000 mg | ORAL_TABLET | Freq: Every evening | ORAL | Status: DC | PRN

## 2016-06-06 MED ORDER — FUROSEMIDE 10 MG/ML IJ SOLN
20.0000 mg | Freq: Once | INTRAMUSCULAR | Status: AC
  Administered 2016-06-07: 20 mg via INTRAVENOUS
  Filled 2016-06-06: qty 2

## 2016-06-06 MED ORDER — SODIUM CHLORIDE 0.9 % IV SOLN
Freq: Once | INTRAVENOUS | Status: AC
  Administered 2016-06-06: 08:00:00 via INTRAVENOUS

## 2016-06-06 MED ORDER — SODIUM CHLORIDE 0.9 % IV SOLN
INTRAVENOUS | Status: DC
  Administered 2016-06-06 (×3): via INTRAVENOUS

## 2016-06-06 MED ORDER — AMLODIPINE BESYLATE 10 MG PO TABS
10.0000 mg | ORAL_TABLET | Freq: Every day | ORAL | Status: DC
  Administered 2016-06-07 – 2016-06-10 (×4): 10 mg via ORAL
  Filled 2016-06-06 (×4): qty 1

## 2016-06-06 MED ORDER — SODIUM CHLORIDE 0.9 % IV SOLN
Freq: Once | INTRAVENOUS | Status: AC
  Administered 2016-06-06 (×2): via INTRAVENOUS

## 2016-06-06 MED ORDER — CALCIUM CARBONATE 1250 (500 CA) MG PO TABS
1250.0000 mg | ORAL_TABLET | Freq: Every day | ORAL | Status: DC
  Administered 2016-06-07 – 2016-06-10 (×4): 1250 mg via ORAL
  Filled 2016-06-06 (×4): qty 1

## 2016-06-06 MED ORDER — SODIUM CHLORIDE 0.9% FLUSH
3.0000 mL | Freq: Two times a day (BID) | INTRAVENOUS | Status: DC
  Administered 2016-06-06 – 2016-06-10 (×7): 3 mL via INTRAVENOUS

## 2016-06-06 MED ORDER — SODIUM CHLORIDE 0.9 % IV SOLN
80.0000 mg | Freq: Once | INTRAVENOUS | Status: AC
  Administered 2016-06-06: 05:00:00 80 mg via INTRAVENOUS
  Filled 2016-06-06: qty 80

## 2016-06-06 MED ORDER — HYDRALAZINE HCL 20 MG/ML IJ SOLN
5.0000 mg | INTRAMUSCULAR | Status: DC | PRN
  Administered 2016-06-08 – 2016-06-09 (×2): 5 mg via INTRAVENOUS
  Filled 2016-06-06 (×3): qty 1

## 2016-06-06 MED ORDER — IOPAMIDOL (ISOVUE-300) INJECTION 61%
100.0000 mL | Freq: Once | INTRAVENOUS | Status: AC | PRN
  Administered 2016-06-06: 100 mL via INTRAVENOUS

## 2016-06-06 MED ORDER — HYDROCODONE-ACETAMINOPHEN 5-325 MG PO TABS
1.0000 | ORAL_TABLET | Freq: Four times a day (QID) | ORAL | Status: DC | PRN
  Administered 2016-06-08 – 2016-06-10 (×3): 1 via ORAL
  Filled 2016-06-06 (×3): qty 1

## 2016-06-06 MED ORDER — VITAMIN E 180 MG (400 UNIT) PO CAPS
400.0000 [IU] | ORAL_CAPSULE | Freq: Every day | ORAL | Status: DC
  Administered 2016-06-07 – 2016-06-10 (×4): 400 [IU] via ORAL
  Filled 2016-06-06 (×6): qty 1

## 2016-06-06 MED ORDER — ACETAMINOPHEN 325 MG PO TABS: 650.0000 mg | ORAL_TABLET | Freq: Four times a day (QID) | ORAL | Status: DC | PRN

## 2016-06-06 NOTE — Progress Notes (Addendum)
Interval note [facility]   Patient admitted after midnight see H&P for full details  83 year old male with history ofmultiple problems including A. fib on Coumadin and sigmoid volvulus presented with hematochezia and abdominal pain. CT scan of the abdomen showed active hemorrhage in the sigmoid colon along with large volume of blood around the colon and in the peritoneum. Patient admitted active bleeding monitoring hemoglobin and surgical consultation.  Patient hemoglobins continue to drop baseline was around 12 --> 9.7. Patient received vitamin K for replacing INR and is being transfused FFP's. GI was also consulted for possible sigmoidoscopy.  A&P Intra-and ?extra luminal colonic GI bleed with peritoneal bleed - unclear etiology, patient on warfarin. INR therapeutic, patient have recent fall, but no significant abdominal blunt.  History of volvulus, the ulcer at the anastomosis site, no signs of perforation case discussed with radiology. Surgery consulted ? If need exploratory lap - reversing coumadin first.  Monitor CBC q 6hrs  Transfuse if Hgb < 8 or symptomatic anemia at any point GI consulted - no sigmoidoscopy at this time.  Case discussed with IR - Think that is more retroperitoneal bleed rather than intraperitoneal. No intervention at this point - will continue to monitor  Will transfer to stepdown   Afib - CHADVASCS 4 - On coumadin at home, HR stable  Hold A/c for now active bleeding - patient eventually need a/c given high risk will discuss with cardiology when GI bleed has resolve Continue BB for now  Continue tele   Critical time spent 55 minutes.   Other conditions as per H&P   Chipper Oman, MD

## 2016-06-06 NOTE — ED Notes (Signed)
Pt had 2 bowel movements with a large amount of bright red blood. Pt is c/o abdominal pain. Denies nausea or vomiting. Pt feeling weak and lightheaded. Hx of colon surgery- on coumadin.

## 2016-06-06 NOTE — H&P (Addendum)
History and Physical    Ian Sullivan. CBJ:[id number] DOB: 1927/12/10 DOA: 06/22/2016  Referring MD/NP/PA:   PCP: [name], MD   Patient coming from:  The patient is coming from home.  At baseline, pt is independent for most of ADL.   Chief Complaint: rectal bleeding and abdominal pain  HPI: Ian Sullivan. is a 83 y.o. male with medical history significant of A. fib on Coumadin, hypertension, GERD, depression, anxiety, peripheral neuropathy, gastritis, duodenitis, dCHF,  sigmoid volvulus (s/p of sigmoid colectomy with anastomosis and 08/2015), s/p of Nissen Fundoplication 1607, who presents with rectal bleeding and abdominal pain).  Patient states that she had 2 episodes of large amount of rectal bleeding with bright red colored blood last night, it happened within on hour at about 11 PM. He denies nausea and vomiting, but he has abdominal pain. It is located in the lower abdomen, 8 out of 10 in severity, sharp, nonradiating. It is not aggravated or alleviated by any known factors. Patient has lightheadedness and weakness. No chest pain or shortness of breath. Patient denies symptoms of UTI, hematuria or unilateral weakness.   # EGD by Dr. Carlean Purl 2010 showed Barrett's esophagus, 3 cm hiatal hernia in the cardia and prior fundoplication and mild gastritis in the antrum # Colonoscope 2010 showed diverticulosis  ED Course: pt was found to have hemoglobin dropped from 12.9 on 09/13/15-->11.7, INR 2.72, creatinine 1.14, temperature normal, no tachycardia, oxygen saturation 99% on room air. Patient is admitted to telemetry bed as inpatient.   Review of Systems:   General: no fevers, chills, no changes in body weight, has poor appetite, has fatigue HEENT: no blurry vision, hearing changes or sore throat Respiratory: no dyspnea, coughing, wheezing CV: no chest pain, no palpitations GI: no nausea, vomiting, has abdominal pain, no diarrhea, constipation.has rectal bleeding. GU: no  dysuria, burning on urination, increased urinary frequency, hematuria  Ext: no leg edema Neuro: no unilateral weakness, numbness, or tingling, no vision change or hearing loss Skin: no rash, no skin tear. MSK: No muscle spasm, no deformity, no limitation of range of movement in spin Heme: No easy bruising.  Travel history: No recent long distant travel.  Allergy:  Allergies  Allergen Reactions  . Horse-Derived Products     Past Medical History:  Diagnosis Date  . Anemia   . Anxiety state, unspecified    past at death of spouse  . Atrial fibrillation ([place])   . Barrett's esophagus   . Depressive disorder, not elsewhere classified    past at death of spouse  . Diverticulosis of colon (without mention of hemorrhage)   . Duodenitis without mention of hemorrhage   . Dyskinesia of esophagus   . GERD (gastroesophageal reflux disease)   . Hereditary and idiopathic peripheral neuropathy 10/23/2014  . Hiatal hernia   . Irritable bowel syndrome   . Long term (current) use of anticoagulants   . Other and unspecified hyperlipidemia   . Skin cancer (melanoma) ([place])   . Status post dilation of esophageal narrowing   . Unspecified essential hypertension   . Unspecified gastritis and gastroduodenitis without mention of hemorrhage   . Unspecified hemorrhoids without mention of complication   . Unspecified sleep apnea     Past Surgical History:  Procedure Laterality Date  . APPENDECTOMY  1945  . CARDIAC CATHETERIZATION  1991  . CARPAL TUNNEL RELEASE Bilateral 2003  . central decompressive laminectomy     L2-L5  . COLONOSCOPY    . ESOPHAGOGASTRODUODENOSCOPY    .  EUS  11/13/2011   Procedure: LOWER ENDOSCOPIC ULTRASOUND (EUS);  Surgeon: Milus Banister, MD;  Location: [facility];  Service: Endoscopy;  Laterality: N/A;  . FLEXIBLE SIGMOIDOSCOPY N/A 09/04/2015   Procedure: FLEXIBLE SIGMOIDOSCOPY;  Surgeon: Manus Gunning, MD;  Location: [facility];  Service: Gastroenterology;   Laterality: N/A;  . LUMBAR LAMINECTOMY    . NISSEN FUNDOPLICATION  7425  . PARTIAL COLECTOMY N/A 09/07/2015   Procedure: OPEN SIGMOID COLECTOMY WITH ANASTAMOSIS;  Surgeon: Johnathan Hausen, MD;  Location: [facility];  Service: General;  Laterality: N/A;  . small bowel obsturction     x4  . SPINAL CORD STIMULATOR IMPLANT  06/2011   Dr. Anselm Lis  . TONSILLECTOMY    . TURP VAPORIZATION  1980s  . VASECTOMY      Social History:  reports that he quit smoking about 44 years ago. He has never used smokeless tobacco. He reports that he drinks about 0.6 oz of alcohol per week . He reports that he does not use drugs.  Family History:  Family History  Problem Relation Age of Onset  . Stroke Mother   . Coronary artery disease Father   . Heart disease Other     [place]  . Colon cancer Neg Hx      Prior to Admission medications   Medication Sig Start Date End Date Taking? Authorizing Provider  acetaminophen (TYLENOL) 500 MG tablet Take 1,000 mg by mouth every 6 (six) hours as needed for mild pain.   Yes Historical Provider, MD  amLODipine (NORVASC) 10 MG tablet TAKE 1 TABLET BY MOUTH EVERY DAY 02/08/16  Yes Lelon Perla, MD  Calcium 600 MG tablet Take 1,200 mg by mouth daily.    Yes Historical Provider, MD  cholecalciferol (VITAMIN D) 1000 UNITS tablet Take 1,000 Units by mouth daily.     Yes Historical Provider, MD  Ferrous Sulfate (IRON) 325 (65 FE) MG TABS Take 1 tablet by mouth daily.     Yes Historical Provider, MD  fish oil-omega-3 fatty acids 1000 MG capsule Take 2 capsules by mouth 2 (two) times daily.    Yes Historical Provider, MD  FLUoxetine (PROZAC) 20 MG tablet Take 20 mg by mouth daily.   Yes Historical Provider, MD  furosemide (LASIX) 40 MG tablet Take 1 tablet (40 mg total) by mouth daily. 04/21/16  Yes Lelon Perla, MD  HYDROcodone-acetaminophen (NORCO/VICODIN) 5-325 MG tablet Take 1 tablet by mouth every 6 (six) hours as needed for moderate pain. 05/15/16  Yes Jessy Oto, MD    KLOR-CON M10 10 MEQ tablet TAKE 1 TABLET BY MOUTH EVERY DAY 04/29/16  Yes Lelon Perla, MD  losartan (COZAAR) 100 MG tablet TAKE 1 TABLET (100 MG TOTAL) BY MOUTH DAILY. 11/14/15  Yes Lelon Perla, MD  Magnesium 100 MG CAPS Take 100 mg by mouth daily.    Yes Historical Provider, MD  Multiple Vitamin (MULTIVITAMIN) tablet Take 1 tablet by mouth daily.   Yes Historical Provider, MD  vitamin E 400 UNIT capsule Take 400 Units by mouth daily.     Yes Historical Provider, MD  warfarin (COUMADIN) 5 MG tablet Take 1-1.5 tablets by mouth daily as directed by coumadin clinic Patient taking differently: Take 1 tablet (5 MG) on Tuesday, Thursday, Saturday and Sunday. Take 1 1/2 tablet (7.5 MG) on Monday, Wednesday and friday 05/19/16  Yes Lelon Perla, MD  acetaminophen (TYLENOL) 325 MG tablet Take 2 tablets (650 mg total) by mouth every 6 (six) hours  as needed for mild pain or moderate pain. Patient not taking: Reported on 05/15/2016 09/13/15   Oswald Hillock, MD    Physical Exam: Vitals:   06/02/2016 0235 06/09/2016 0237 06/21/2016 0300 06/13/2016 0359  BP:  107/63 166/61 137/74  Pulse:  67 72 66  Resp:  14  16  Temp:      TempSrc:      SpO2:  99% 99% 95%  Weight: 81.6 kg (180 lb)     Height: 5\' 11"  (1.803 m)      General: Not in acute distress HEENT:       Eyes: PERRL, EOMI, no scleral icterus.       ENT: No discharge from the ears and nose, no pharynx injection, no tonsillar enlargement.        Neck: No JVD, no bruit, no mass felt. Heme: No neck lymph node enlargement. Cardiac: S1/S2, No murmurs, No gallops or rubs. Respiratory:  No rales, wheezing, rhonchi or rubs. GI: Soft, nondistended, nontender, no rebound pain, no organomegaly, BS present. GU: No hematuria Ext: No pitting leg edema bilaterally. 2+DP/PT pulse bilaterally. Has venous insufficiency changes in legs. Musculoskeletal: No joint deformities, No joint redness or warmth, no limitation of ROM in spin. Skin: No rashes.  Neuro:  Alert, oriented X3, cranial nerves II-XII grossly intact, moves all extremities normally.  Psych: Patient is not psychotic, no suicidal or hemocidal ideation.  Labs on Admission: I have personally reviewed following labs and imaging studies  CBC:  Recent Labs Lab 06/03/2016 0223  WBC 8.3  NEUTROABS 6.7  HGB 11.7*  HCT 34.7*  MCV 94.6  PLT 127   Basic Metabolic Panel:  Recent Labs Lab 06/19/2016 0223  NA 140  K 3.9  CL 105  CO2 25  GLUCOSE 131*  BUN 24*  CREATININE 1.14  CALCIUM 9.3   GFR: Estimated Creatinine Clearance: 49.5 mL/min (by C-G formula based on SCr of 1.14 mg/dL). Liver Function Tests: No results for input(s): AST, ALT, ALKPHOS, BILITOT, PROT, ALBUMIN in the last 168 hours. No results for input(s): LIPASE, AMYLASE in the last 168 hours. No results for input(s): AMMONIA in the last 168 hours. Coagulation Profile:  Recent Labs Lab 06/24/2016 0223  INR 2.72   Cardiac Enzymes: No results for input(s): CKTOTAL, CKMB, CKMBINDEX, TROPONINI in the last 168 hours. BNP (last 3 results) No results for input(s): PROBNP in the last 8760 hours. HbA1C: No results for input(s): HGBA1C in the last 72 hours. CBG: No results for input(s): GLUCAP in the last 168 hours. Lipid Profile: No results for input(s): CHOL, HDL, LDLCALC, TRIG, CHOLHDL, LDLDIRECT in the last 72 hours. Thyroid Function Tests: No results for input(s): TSH, T4TOTAL, FREET4, T3FREE, THYROIDAB in the last 72 hours. Anemia Panel: No results for input(s): VITAMINB12, FOLATE, FERRITIN, TIBC, IRON, RETICCTPCT in the last 72 hours. Urine analysis:    Component Value Date/Time   COLORURINE YELLOW 12/27/2007 [place] 12/27/2007 1315   LABSPEC 1.021 12/27/2007 1315   PHURINE 6.5 12/27/2007 1315   GLUCOSEU NEGATIVE 12/27/2007 1315   HGBUR NEGATIVE 12/27/2007 1315   BILIRUBINUR NEGATIVE 12/27/2007 1315   KETONESUR NEGATIVE 12/27/2007 1315   PROTEINUR NEGATIVE 12/27/2007 1315    UROBILINOGEN 1.0 12/27/2007 1315   NITRITE NEGATIVE 12/27/2007 1315   LEUKOCYTESUR  12/27/2007 1315    NEGATIVE MICROSCOPIC NOT DONE ON URINES WITH NEGATIVE PROTEIN, BLOOD, LEUKOCYTES, NITRITE, OR GLUCOSE <1000 mg/dL.   Sepsis Labs: @LABRCNTIP (procalcitonin:4,lacticidven:4) )No results found for this or any previous visit (  from the past 240 hour(s)).   Radiological Exams on Admission: No results found.   EKG: Not done in ED, will get one.   Assessment/Plan Principal Problem:   GIB (gastrointestinal bleeding) Active Problems:   Depression   Essential hypertension   ATRIAL FIBRILLATION   Chronic diastolic CHF (congestive heart failure) ([place])   Encounter for therapeutic drug monitoring   GERD (gastroesophageal reflux disease)   GIB (gastrointestinal bleeding): hemoglobin dropped from 12.9 on 09/13/15-->11.7, INR 2.72. Patient has generalized weakness and lightheadedness, but hemodynamically stable currently. Source of bleeding is not clear. He has lower abdominal pain with unknown etiology, will get Ct scan to evaluate.   - will admit to tele bed as inpt - NPO - NS at 75 mL/hr - Start IV pantoprazole IV  gtt - Zofran IV for nausea and morphine for AP - Avoid NSAIDs and SQ heparin - Maintain IV access (2 large bore IVs if possible). - Monitor closely and follow q6h cbc, transfuse as necessary. - LaB: INR, PTT, type screen - Please call GI in AM - check lipase  Addendum: CT-abd/pelvis showed active hemorrhage in the proximal sigmoid mesocolon with moderately large volume of blood around the colon and collecting dependently in the peritoneum.  -GI, Dr. Henrene Pastor was consulted. -will reverse INR now. I discussed with pharmacist, who recommended to give 10 mg Vk by IV now. -surgeon, Dr. Lucia Gaskins was consulted.  Chronic diastolic CHF (congestive heart failure) ([place]): 2-D echo on 11/21/14 showed EF of 55-60%. Patient does not have leg edema or JVD. CHF is compensated on admission. -Hold  Lasix while on NPO -Check BNP  Atrial Fibrillation: CHA2DS2-VASc Score is 4, needs oral anticoagulation. Patient is on Coumadin at home. INR is 2.72 on admission. Heart rate is well controlled without using AV node blockers. -will hold coumadin due to GIB. Pt is aware of the risk of stroke. -tele monitoring  HTN: -Hold lasix while on NPO as above -hold cozarr since his Cre is slightly up at 1.14 and pt is going to get CT-abd/pelvis with contrast -continue amlodipine -IV hydralazine when necessary  GERD: -on IV Protonix  Depression: Stable, no suicidal or homicidal ideations. -Continue home medications: Prozac  Chronic back pain; -continue home prn Norco  DVT ppx: SCD Code Status: Full code Family Communication: Yes, patient's son at bed side Disposition Plan:  Anticipate discharge back to previous home environment Consults called:  None Admission status:  Inpatient/tele     Date of Service 06/05/2016    Ivor Costa [facility] Pager [phone]  If 7PM-7AM, please contact night-coverage [email or web address] Password [email or web address] 06/27/2016, 4:12 AM

## 2016-06-06 NOTE — Consult Note (Addendum)
Re:   Ian Sullivan. DOB:   31-Aug-1927 MRN:   [id number]  ASSESSMENT AND PLAN: 1.  GI bleed - bleed around sigmoid colon - unusual presentation  Hgb - 11.2 - 06/02/2016  Plan:  Reverse anticoagulation (Vit K already given, I will order FFP), should he be moved to the step down until picture clearer?, may need sigmoidoscopy to look at colon (so will need GI on board - Drs. Gessner/Armbruster saw him when he had the volvulus)  Will follow  2.  Anticoagulated for A. Fib  PT/INR - 29/2.68 3.  Sigmoid colectomy with anastomosis - 09/07/2015 Hassell Done  For sigmoid volvulus  4.  HTN 5.  A Fib             Seen By Dr. Kirk Ruths  Chief Complaint  Patient presents with  . Rectal Bleeding   PHYSICIAN REQUESTING CONSULTATION: [name], MD  HISTORY OF PRESENT ILLNESS: Ian Sullivan. is a 83 y.o. (DOB: 02-22-1928)  white male whose primary care physician is [name], MD. His son, [name], is in the room with him when I saw him.  He was doing well and had sudden onset of bright red blood by rectum last PM.  He also developed some lower abdominal pain.  [facility]. He gives no history of trauma or fall.  He's had no prior bleeding issues.   His normal therapeutic INR is around 2.5 and has not been particularly abnormal. He did have a sigmoid volvulus that required resection by Dr. Hassell Done on 09/07/2015 -but has done well from a colon standpoint since then.  CT scan - abdomen/pelvis - 06/15/2016: 1. Active hemorrhage in the proximal sigmoid mesocolon with moderately large volume of blood around the colon and collecting dependently in the peritoneum.  2. Mildly irregular liver, raising the question of cirrhosis. No focal liver lesion.  Past Medical History:  Diagnosis Date  . Anemia   . Anxiety state, unspecified    past at death of spouse  . Atrial fibrillation ([place])   . Barrett's esophagus   . Depressive disorder, not elsewhere classified    past at death of spouse    . Diverticulosis of colon (without mention of hemorrhage)   . Duodenitis without mention of hemorrhage   . Dyskinesia of esophagus   . GERD (gastroesophageal reflux disease)   . Hereditary and idiopathic peripheral neuropathy 10/23/2014  . Hiatal hernia   . Irritable bowel syndrome   . Long term (current) use of anticoagulants   . Other and unspecified hyperlipidemia   . Skin cancer (melanoma) ([place])   . Status post dilation of esophageal narrowing   . Unspecified essential hypertension   . Unspecified gastritis and gastroduodenitis without mention of hemorrhage   . Unspecified hemorrhoids without mention of complication   . Unspecified sleep apnea       Past Surgical History:  Procedure Laterality Date  . APPENDECTOMY  1945  . CARDIAC CATHETERIZATION  1991  . CARPAL TUNNEL RELEASE Bilateral 2003  . central decompressive laminectomy     L2-L5  . COLONOSCOPY    . ESOPHAGOGASTRODUODENOSCOPY    . EUS  11/13/2011   Procedure: LOWER ENDOSCOPIC ULTRASOUND (EUS);  Surgeon: Milus Banister, MD;  Location: [facility];  Service: Endoscopy;  Laterality: N/A;  . FLEXIBLE SIGMOIDOSCOPY N/A 09/04/2015   Procedure: FLEXIBLE SIGMOIDOSCOPY;  Surgeon: Manus Gunning, MD;  Location: [facility];  Service: Gastroenterology;  Laterality: N/A;  . LUMBAR LAMINECTOMY    .  NISSEN FUNDOPLICATION  6269  . PARTIAL COLECTOMY N/A 09/07/2015   Procedure: OPEN SIGMOID COLECTOMY WITH ANASTAMOSIS;  Surgeon: Johnathan Hausen, MD;  Location: [facility];  Service: General;  Laterality: N/A;  . small bowel obsturction     x4  . SPINAL CORD STIMULATOR IMPLANT  06/2011   Dr. Anselm Lis  . TONSILLECTOMY    . TURP VAPORIZATION  1980s  . VASECTOMY        Current Facility-Administered Medications  Medication Dose Route Frequency Provider Last Rate Last Dose  . 0.9 %  sodium chloride infusion   Intravenous Continuous Ivor Costa, MD 75 mL/hr at 06/07/2016 0411    . acetaminophen (TYLENOL) tablet 650 mg  650 mg Oral Q6H  PRN Ivor Costa, MD      . amLODipine (NORVASC) tablet 10 mg  10 mg Oral Daily Ivor Costa, MD      . calcium carbonate (OS-CAL - dosed in mg of elemental calcium) tablet 1,250 mg  1,250 mg Oral Daily Ivor Costa, MD      . cholecalciferol (VITAMIN D) tablet 1,000 Units  1,000 Units Oral Daily Ivor Costa, MD      . ferrous sulfate tablet 325 mg  325 mg Oral Daily Ivor Costa, MD      . FLUoxetine (PROZAC) capsule 20 mg  20 mg Oral Daily Ivor Costa, MD      . hydrALAZINE (APRESOLINE) injection 5 mg  5 mg Intravenous Q2H PRN Ivor Costa, MD      . HYDROcodone-acetaminophen (NORCO/VICODIN) 5-325 MG per tablet 1 tablet  1 tablet Oral Q6H PRN Ivor Costa, MD      . magnesium oxide (MAG-OX) tablet 200 mg  200 mg Oral Daily Ivor Costa, MD      . morphine 4 MG/ML injection 2 mg  2 mg Intravenous Q4H PRN Ivor Costa, MD   2 mg at 06/02/2016 4854  . multivitamin with minerals tablet 1 tablet  1 tablet Oral Daily Ivor Costa, MD      . omega-3 acid ethyl esters (LOVAZA) capsule 1 g  1 capsule Oral BID Ivor Costa, MD      . ondansetron [facility]) injection 4 mg  4 mg Intravenous Q8H PRN Ivor Costa, MD      . pantoprazole (PROTONIX) 80 mg in sodium chloride 0.9 % 250 mL (0.32 mg/mL) infusion  8 mg/hr Intravenous Continuous Ivor Costa, MD 25 mL/hr at 05/30/2016 0545 8 mg/hr at 06/14/2016 0545  . phytonadione (VITAMIN K) 10 mg in dextrose 5 % 50 mL IVPB  10 mg Intravenous Once Ivor Costa, MD   10 mg at 06/07/2016 6270  . sodium chloride flush (NS) 0.9 % injection 3 mL  3 mL Intravenous Q12H Ivor Costa, MD      . vitamin E capsule 400 Units  400 Units Oral Daily Ivor Costa, MD      . zolpidem (AMBIEN) tablet 5 mg  5 mg Oral QHS PRN Ivor Costa, MD          Allergies  Allergen Reactions  . Horse-Derived Products     REVIEW OF SYSTEMS: Skin:  No history of rash.  No history of abnormal moles. Infection:  No history of hepatitis or HIV.  No history of MRSA. Neurologic:  No history of stroke.  No history of seizure.  No history of  headaches. Cardiac:  A. Fib.  Sees Dr. Stanford Breed Pulmonary:  Does not smoke cigarettes.  No asthma or bronchitis.  No OSA/CPAP.  Endocrine:  No diabetes. No  thyroid disease. Gastrointestinal:  See HPI Urologic:  No history of kidney stones.  No history of bladder infections. Musculoskeletal:  No history of joint or back disease. Hematologic:  Anticoagulated Psycho-social:  The patient is oriented.   The patient has no obvious psychologic or social impairment to understanding our conversation and plan.  SOCIAL and FAMILY HISTORY: Widower. Lives by self. Has one son [name] - in room with patient  PHYSICAL EXAM: BP 128/67 (BP Location: Left Arm)   Pulse 62   Temp 98.4 F (36.9 C) (Oral)   Resp 18   Ht 5' 11.5" (1.816 m)   Wt 80.3 kg (177 lb 0.5 oz)   SpO2 97%   BMI 24.35 kg/m   General: WN older WM who is alert.  Skin:  Inspection and palpation - no mass or rash. Eyes:  Conjunctiva and lids unremarkable.            Pupils are equal Ears, Nose, Mouth, and Throat:  Ears and nose unremarkable            Lips and teeth are unremarable. Neck: Supple. No mass, trachea midline.  No thyroid mass. Lymph Nodes:  No supraclavicular, cervical, or inguinal nodes. Lungs: Normal respiratory effort.  Clear to auscultation and symmetric breath sounds. Heart:  Palpation of the heart is normal.            Auscultation: RRR. No murmur or rub.  Abdomen: He has some moderate distention.  Has some BS.         He has lower abdominal discomfort.  Musculoskeletal:  Good muscle strength and ROM  in upper and lower extremities.  Neurologic:  Grossly intact to motor and sensory function. Psychiatric: Normal judgement and insight. Behavior is normal.            Oriented to time, person, place.   DATA REVIEWED, COUNSELING AND COORDINATION OF CARE: Epic notes reviewed. Counseling and coordination of care exceeded more than 50% of the time spent with patient. Total time spent with patient and  charting: [place], MD,  [facility], [address]  [place], [place]    [place] Phone:  [phone] FAX:  [phone]

## 2016-06-06 NOTE — Progress Notes (Signed)
  Subjective: Complains of lower abdominal pain. No nausea  Objective: Vital signs in last 24 hours: Temp:  [97.8 F (36.6 C)-100.1 F (37.8 C)] 100.1 F (37.8 C) (03/09 1334) Pulse Rate:  [59-72] 69 (03/09 1300) Resp:  [14-22] 22 (03/09 1230) BP: (107-174)/(51-74) 155/51 (03/09 1334) SpO2:  [93 %-100 %] 93 % (03/09 1334) Weight:  [80.3 kg (177 lb 0.5 oz)-81.6 kg (180 lb)] 80.3 kg (177 lb 0.5 oz) (03/09 0536) Last BM Date: 06/18/2016  Intake/Output from previous day: 03/08 0701 - 03/09 0700 In: 257.5 [I.V.:207.5; IV Piggyback:50] Out: 2 [Urine:1; Stool:1] Intake/Output this shift: Total I/O In: 828.1 [I.V.:142.1; Blood:686] Out: -   Resp: clear to auscultation bilaterally Cardio: regular rate and rhythm GI: soft upper abd, tender over lower abd  Lab Results:   Recent Labs  06/27/2016 0406 06/08/2016 1146  WBC 9.2 9.5  HGB 11.2* 9.7*  HCT 32.7* 28.4*  PLT 183 171   BMET  Recent Labs  06/10/2016 0223 06/22/2016 0406  NA 140 141  K 3.9 4.3  CL 105 107  CO2 25 27  GLUCOSE 131* 110*  BUN 24* 26*  CREATININE 1.14 0.96  CALCIUM 9.3 9.4   PT/INR  Recent Labs  06/15/2016 0223 06/27/2016 0406  LABPROT 29.4* 29.1*  INR 2.72 2.68   ABG No results for input(s): PHART, HCO3 in the last 72 hours.  Invalid input(s): PCO2, PO2  Studies/Results: Ct Abdomen Pelvis W Contrast  Result Date: 06/22/2016 CLINICAL DATA:  Abdominal pain and weakness. Blood in stool. Anticoagulated. EXAM: CT ABDOMEN AND PELVIS WITH CONTRAST TECHNIQUE: Multidetector CT imaging of the abdomen and pelvis was performed using the standard protocol following bolus administration of intravenous contrast. CONTRAST:  100 mL Isovue 300 intravenous COMPARISON:  09/03/2015 FINDINGS: Lower chest: No acute abnormality. Hepatobiliary: Mild surface irregularities of the liver, raising the question of cirrhosis. No focal liver lesions. Gallbladder and bile ducts are unremarkable. Pancreas: Unremarkable. No pancreatic  ductal dilatation or surrounding inflammatory changes. Spleen: Normal in size without focal abnormality. Adrenals/Urinary Tract: Adrenal glands are unremarkable. Kidneys are normal, without renal calculi, focal lesion, or hydronephrosis. Bladder is unremarkable. Stomach/Bowel: Fundoplication. Stomach is otherwise unremarkable. Small bowel is unremarkable. There is active hemorrhage in the proximal sigmoid mesocolon, proximal to the sigmoid anastomosis. There is blood surrounding the colon within the mesentery and continuing dependently into the peritoneum. Active hemorrhage is present at the time of this scan. Vascular/Lymphatic: The abdominal aorta is normal in caliber with moderate atherosclerotic calcification. No adenopathy is evident in the abdomen or pelvis. Reproductive: Unremarkable Other: No bowel obstruction. No extraluminal gas. Sigmoid anastomosis appears unremarkable. Musculoskeletal: No significant skeletal lesion. Posterior decompression with instrumented fusion at L4-5. IMPRESSION: 1. Active hemorrhage in the proximal sigmoid mesocolon with moderately large volume of blood around the colon and collecting dependently in the peritoneum. 2. These results were called by telephone at the time of interpretation on 06/28/2016 at 5:23 am to Dr. Ivor Costa , who verbally acknowledged these results. 3. Mildly irregular liver, raising the question of cirrhosis. No focal liver lesion. Electronically Signed   By: Andreas Newport M.D.   On: 06/14/2016 05:23    Anti-infectives: Anti-infectives    None      Assessment/Plan: s/p * No surgery found * Needs coags corrected.  It is possible that bleeding around colon may stop once INR is normal Monitor exam and serial hg  LOS: 0 days    TOTH III,Jarrid Lienhard S 06/15/2016

## 2016-06-06 NOTE — Progress Notes (Signed)
Patient has had another bloody BM.  It was a large amount of bright red blood.  Patient denies dizziness or other symptoms besides abdominal pain at this time.

## 2016-06-06 NOTE — ED Provider Notes (Signed)
[place] DEPT Provider Note   CSN: [id number] Arrival date & time: 06/21/2016  0148   By signing my name below, I, Eunice Blase, attest that this documentation has been prepared under the direction and in the presence of Delora Fuel, MD. Electronically signed, Eunice Blase, ED Scribe. 06/18/2016. 2:08 AM.   History   Chief Complaint Chief Complaint  Patient presents with  . Rectal Bleeding   The history is provided by the patient, medical records and a relative. No language interpreter was used.    HPI Comments: Ian Sullivan. is a 83 y.o. male who presents to the Emergency Department complaining of new onset rectal bleeding 06/05/2016. Pt states he found blood in his stool during 2 bowel movements yesterday. He reports associated weakness and lightheadedness. He furhter notes Hx of a procedure to correct a volvulus conducted in [facility] ~08/2015. He adds his INR levels were last checked ~3 weeks ago. He also reports he has been seen by a gastroenterologist >5 years ago without a colonoscopy at the time. He states he had a colonoscopy during his aforementioned visit to [facility] ~08/2015, and he adds Hx of diverticulitis diagnosed in [facility]. Pt denies N/V/D, syncope and abdominal pain.   Past Medical History:  Diagnosis Date  . Anemia   . Anxiety state, unspecified    past at death of spouse  . Atrial fibrillation ([place])   . Barrett's esophagus   . Depressive disorder, not elsewhere classified    past at death of spouse  . Diverticulosis of colon (without mention of hemorrhage)   . Duodenitis without mention of hemorrhage   . Dyskinesia of esophagus   . GERD (gastroesophageal reflux disease)   . Hereditary and idiopathic peripheral neuropathy 10/23/2014  . Hiatal hernia   . Irritable bowel syndrome   . Long term (current) use of anticoagulants   . Other and unspecified hyperlipidemia   . Skin cancer (melanoma) ([place])   . Status post dilation of esophageal narrowing   . Unspecified  essential hypertension   . Unspecified gastritis and gastroduodenitis without mention of hemorrhage   . Unspecified hemorrhoids without mention of complication   . Unspecified sleep apnea     Patient Active Problem List   Diagnosis Date Noted  . S/P sigmoid colectomy June 2017 09/07/2015  . Hypokalemia   . Sigmoid volvulus ([place])   . Chronic constipation   . Thrombocytopenia ([place]) 01/16/2015  . Hereditary and idiopathic peripheral neuropathy 10/23/2014  . Lumbar radiculopathy 04/26/2014  . Encounter for therapeutic drug monitoring 04/20/2013  . Nonspecific (abnormal) findings on radiological and other examination of gastrointestinal tract 11/13/2011  . Pharyngeal dysphagia-mild 10/24/2011  . Prostate mass 10/24/2011  . HYPERGLYCEMIA 01/11/2010  . ATRIAL FIBRILLATION 12/20/2009  . Congestive heart failure ([place]) 12/20/2009  . MITRAL REGURGITATION 01/24/2009  . SLEEP APNEA 07/11/2008  . ESOPHAGEAL MOTILITY DISORDER 06/28/2008  . DYSLIPIDEMIA 06/04/2007  . ANXIETY 06/04/2007  . DEPRESSION 06/04/2007  . Essential hypertension 06/04/2007  . IRRITABLE BOWEL SYNDROME 06/04/2007  . BARRETTS ESOPHAGUS 09/09/2005    Past Surgical History:  Procedure Laterality Date  . APPENDECTOMY  1945  . CARDIAC CATHETERIZATION  1991  . CARPAL TUNNEL RELEASE Bilateral 2003  . central decompressive laminectomy     L2-L5  . COLONOSCOPY    . ESOPHAGOGASTRODUODENOSCOPY    . EUS  11/13/2011   Procedure: LOWER ENDOSCOPIC ULTRASOUND (EUS);  Surgeon: Milus Banister, MD;  Location: [facility];  Service: Endoscopy;  Laterality: N/A;  . FLEXIBLE SIGMOIDOSCOPY  N/A 09/04/2015   Procedure: FLEXIBLE SIGMOIDOSCOPY;  Surgeon: Manus Gunning, MD;  Location: [facility];  Service: Gastroenterology;  Laterality: N/A;  . LUMBAR LAMINECTOMY    . NISSEN FUNDOPLICATION  6962  . PARTIAL COLECTOMY N/A 09/07/2015   Procedure: OPEN SIGMOID COLECTOMY WITH ANASTAMOSIS;  Surgeon: Johnathan Hausen, MD;  Location: [facility];   Service: General;  Laterality: N/A;  . small bowel obsturction     x4  . SPINAL CORD STIMULATOR IMPLANT  06/2011   Dr. Anselm Lis  . TONSILLECTOMY    . TURP VAPORIZATION  1980s  . VASECTOMY         Home Medications    Prior to Admission medications   Medication Sig Start Date End Date Taking? Authorizing Provider  acetaminophen (TYLENOL) 325 MG tablet Take 2 tablets (650 mg total) by mouth every 6 (six) hours as needed for mild pain or moderate pain. Patient not taking: Reported on 05/15/2016 09/13/15   Oswald Hillock, MD  amLODipine (NORVASC) 10 MG tablet TAKE 1 TABLET BY MOUTH EVERY DAY 02/08/16   Lelon Perla, MD  Calcium 600 MG tablet Take 1,200 mg by mouth daily. Every other day    Historical Provider, MD  cholecalciferol (VITAMIN D) 1000 UNITS tablet Take 1,000 Units by mouth daily.      Historical Provider, MD  Ferrous Sulfate (IRON) 325 (65 FE) MG TABS Take 1 tablet by mouth daily.      Historical Provider, MD  fish oil-omega-3 fatty acids 1000 MG capsule Take 2 capsules by mouth 2 (two) times daily.     Historical Provider, MD  FLUoxetine (PROZAC) 20 MG tablet Take 20 mg by mouth daily.    Historical Provider, MD  furosemide (LASIX) 40 MG tablet Take 1 tablet (40 mg total) by mouth daily. 04/21/16   Lelon Perla, MD  HYDROcodone-acetaminophen (NORCO/VICODIN) 5-325 MG tablet Take 1 tablet by mouth every 6 (six) hours as needed for moderate pain. 05/15/16   Jessy Oto, MD  KLOR-CON M10 10 MEQ tablet TAKE 1 TABLET BY MOUTH EVERY DAY 04/29/16   Lelon Perla, MD  losartan (COZAAR) 100 MG tablet TAKE 1 TABLET (100 MG TOTAL) BY MOUTH DAILY. 11/14/15   Lelon Perla, MD  Magnesium 100 MG CAPS Take 100 mg by mouth daily.     Historical Provider, MD  Multiple Vitamin (MULTIVITAMIN) tablet Take 1 tablet by mouth daily.    Historical Provider, MD  vitamin E 400 UNIT capsule Take 400 Units by mouth daily.      Historical Provider, MD  warfarin (COUMADIN) 5 MG tablet Take 1-1.5  tablets by mouth daily as directed by coumadin clinic 05/19/16   Lelon Perla, MD    Family History Family History  Problem Relation Age of Onset  . Stroke Mother   . Coronary artery disease Father   . Heart disease Other     [place]  . Colon cancer Neg Hx     Social History Social History  Substance Use Topics  . Smoking status: Former Smoker    Quit date: 03/31/1972  . Smokeless tobacco: Never Used  . Alcohol use 0.6 oz/week    1 Shots of liquor per week     Comment: ocass     Allergies   Horse-derived products   Review of Systems Review of Systems  All other systems reviewed and are negative.  A complete 10 system review of systems was obtained and all systems are negative except as noted  in the HPI and PMH.    Physical Exam Updated Vital Signs BP 121/71 (BP Location: Right Arm)   Pulse 66   Resp 18   SpO2 100%   Physical Exam  Constitutional: He is oriented to person, place, and time. He appears well-developed and well-nourished.  HENT:  Head: Normocephalic and atraumatic.  Eyes: EOM are normal. Pupils are equal, round, and reactive to light.  Neck: Normal range of motion. Neck supple. No JVD present.  Cardiovascular: Normal rate, regular rhythm and normal heart sounds.   No murmur heard. Pulmonary/Chest: Effort normal and breath sounds normal. He has no wheezes. He has no rales. He exhibits no tenderness.  Abdominal: Soft. Bowel sounds are normal. He exhibits no distension and no mass. There is no tenderness.  Genitourinary:  Genitourinary Comments: Decreased sphincter tone with bright red blood.  Musculoskeletal: Normal range of motion. He exhibits no edema.  Lymphadenopathy:    He has no cervical adenopathy.  Neurological: He is alert and oriented to person, place, and time. No cranial nerve deficit. He exhibits normal muscle tone. Coordination normal.  Skin: Skin is warm and dry. No rash noted.  Psychiatric: He has a normal mood and affect. His behavior  is normal. Judgment and thought content normal.  Nursing note and vitals reviewed.    ED Treatments / Results  DIAGNOSTIC STUDIES: Oxygen Saturation is 100% on RA, normal by my interpretation.    COORDINATION OF CARE: 2:04 AM Discussed treatment plan with pt at bedside and pt agreed to plan. Will check labs and records, then reassess.  Labs (all labs ordered are listed, but only abnormal results are displayed) Labs Reviewed  BASIC METABOLIC PANEL - Abnormal; Notable for the following:       Result Value   Glucose, Bld 131 (*)    BUN 24 (*)    GFR calc non Af Amer 56 (*)    All other components within normal limits  CBC WITH DIFFERENTIAL/PLATELET - Abnormal; Notable for the following:    RBC 3.67 (*)    Hemoglobin 11.7 (*)    HCT 34.7 (*)    All other components within normal limits  PROTIME-INR - Abnormal; Notable for the following:    Prothrombin Time 29.4 (*)    All other components within normal limits  POC OCCULT BLOOD, ED - Abnormal; Notable for the following:    Fecal Occult Bld POSITIVE (*)    All other components within normal limits  TYPE AND SCREEN   Procedures Procedures (including critical care time)  Medications Ordered in ED Medications - No data to display   Initial Impression / Assessment and Plan / ED Course  I have reviewed the triage vital signs and the nursing notes.  Pertinent labs & imaging results that were available during my care of the patient were reviewed by me and considered in my medical decision making (see chart for details).  Rectal bleeding and patient anticoagulated on warfarin. Old records are reviewed showing long-standing anticoagulation for atrial fibrillation, sigmoid colectomy for sigmoid volvulus last June. Vital signs are normal in the ED. Laboratory workup shows a very slight drop in hemoglobin compared with last value from June 2017. INR is therapeutic. Given his anticoagulated state, he will need to be observed to make sure  that his bleeding has stopped. Case is discussed with Dr. Blaine Hamper of [facility] hospital[facility] who agrees to admit the patient.  Final Clinical Impressions(s) / ED Diagnoses   Final diagnoses:  Rectal bleeding  Anticoagulated  on warfarin  Normochromic normocytic anemia    New Prescriptions New Prescriptions   No medications on file   I personally performed the services described in this documentation, which was scribed in my presence. The recorded information has been reviewed and is accurate.     Delora Fuel, MD 22/63/33 [id number]

## 2016-06-06 NOTE — Consult Note (Signed)
Referring Provider: [facility]  Primary Care Physician:  [name], MD Primary Gastroenterologist: Silvano Rusk, MD  Reason for Consultation: GI bleed  ASSESSMENT AND PLAN:  71. 83 yo male with GI bleed. Unusual findings on CTscan with active bleeding in proximal sigmoid and large amount of blood around the colon collecting in the peritoneum.  -Surgery has evaluated, recommending sigmoidoscopy at this point.  -keep NPO for now -reverse INR  2. Chronic anticoagulation. INR 2.6.  -Getting FFP now.   3. Mild normocytic anemia, chronic. Hgb was 11.2 at 4am, baseline is around 12. I expect this to drop by time of next lab draw.  -blood has been typed and screened  4. AFib, rate controlled. Coumadin on hold.   5. ? Cirrhosis by CTscan, not mentioned on June 2017 scan.  -Further evaluation after acute issues resolve  HPI: Ian Sullivan. is a 83 y.o. male with AFib, on chronic coumadin, chronic diastolic heart failure, HTN , Barrett's esophagus, remote Nissen fundoplication and sigmoid colectomy with anastomosis for volvulus in June. Patient began having constant mid to lower abdominal pain and tenderness about a week ago. Pain not related to eating or bowel movements. It does increase with physical activity. Late last night he had two bloody BMs. He has associated weakness / lightheadedness. He just had another bloody BM.  In ED his INR was 2.72, got Vit K, INR 2.68 today. FFP ordered. His hgb is stable.  Son in room, walks me out to talk. Son concerned about father living alone in two story home. He has apparently fallen several times.   Past Medical History:  Diagnosis Date  . Anemia   . Anxiety state, unspecified    past at death of spouse  . Atrial fibrillation ([place])   . Barrett's esophagus   . Depressive disorder, not elsewhere classified    past at death of spouse  . Diverticulosis of colon (without mention of hemorrhage)   . Duodenitis without mention of  hemorrhage   . Dyskinesia of esophagus   . GERD (gastroesophageal reflux disease)   . Hereditary and idiopathic peripheral neuropathy 10/23/2014  . Hiatal hernia   . Irritable bowel syndrome   . Long term (current) use of anticoagulants   . Other and unspecified hyperlipidemia   . Skin cancer (melanoma) ([place])   . Status post dilation of esophageal narrowing   . Unspecified essential hypertension   . Unspecified gastritis and gastroduodenitis without mention of hemorrhage   . Unspecified hemorrhoids without mention of complication   . Unspecified sleep apnea     Past Surgical History:  Procedure Laterality Date  . APPENDECTOMY  1945  . CARDIAC CATHETERIZATION  1991  . CARPAL TUNNEL RELEASE Bilateral 2003  . central decompressive laminectomy     L2-L5  . COLONOSCOPY    . ESOPHAGOGASTRODUODENOSCOPY    . EUS  11/13/2011   Procedure: LOWER ENDOSCOPIC ULTRASOUND (EUS);  Surgeon: Milus Banister, MD;  Location: [facility];  Service: Endoscopy;  Laterality: N/A;  . FLEXIBLE SIGMOIDOSCOPY N/A 09/04/2015   Procedure: FLEXIBLE SIGMOIDOSCOPY;  Surgeon: Manus Gunning, MD;  Location: [facility];  Service: Gastroenterology;  Laterality: N/A;  . LUMBAR LAMINECTOMY    . NISSEN FUNDOPLICATION  3419  . PARTIAL COLECTOMY N/A 09/07/2015   Procedure: OPEN SIGMOID COLECTOMY WITH ANASTAMOSIS;  Surgeon: Johnathan Hausen, MD;  Location: [facility];  Service: General;  Laterality: N/A;  . small bowel obsturction     x4  . SPINAL CORD STIMULATOR IMPLANT  06/2011   Dr. Anselm Lis  . TONSILLECTOMY    . TURP VAPORIZATION  1980s  . VASECTOMY      Prior to Admission medications   Medication Sig Start Date End Date Taking? Authorizing Provider  acetaminophen (TYLENOL) 500 MG tablet Take 1,000 mg by mouth every 6 (six) hours as needed for mild pain.   Yes Historical Provider, MD  amLODipine (NORVASC) 10 MG tablet TAKE 1 TABLET BY MOUTH EVERY DAY 02/08/16  Yes Lelon Perla, MD  Calcium 600 MG tablet Take  1,200 mg by mouth daily.    Yes Historical Provider, MD  cholecalciferol (VITAMIN D) 1000 UNITS tablet Take 1,000 Units by mouth daily.     Yes Historical Provider, MD  Ferrous Sulfate (IRON) 325 (65 FE) MG TABS Take 1 tablet by mouth daily.     Yes Historical Provider, MD  fish oil-omega-3 fatty acids 1000 MG capsule Take 2 capsules by mouth 2 (two) times daily.    Yes Historical Provider, MD  FLUoxetine (PROZAC) 20 MG tablet Take 20 mg by mouth daily.   Yes Historical Provider, MD  furosemide (LASIX) 40 MG tablet Take 1 tablet (40 mg total) by mouth daily. 04/21/16  Yes Lelon Perla, MD  HYDROcodone-acetaminophen (NORCO/VICODIN) 5-325 MG tablet Take 1 tablet by mouth every 6 (six) hours as needed for moderate pain. 05/15/16  Yes Jessy Oto, MD  KLOR-CON M10 10 MEQ tablet TAKE 1 TABLET BY MOUTH EVERY DAY 04/29/16  Yes Lelon Perla, MD  losartan (COZAAR) 100 MG tablet TAKE 1 TABLET (100 MG TOTAL) BY MOUTH DAILY. 11/14/15  Yes Lelon Perla, MD  Magnesium 100 MG CAPS Take 100 mg by mouth daily.    Yes Historical Provider, MD  Multiple Vitamin (MULTIVITAMIN) tablet Take 1 tablet by mouth daily.   Yes Historical Provider, MD  vitamin E 400 UNIT capsule Take 400 Units by mouth daily.     Yes Historical Provider, MD  warfarin (COUMADIN) 5 MG tablet Take 1-1.5 tablets by mouth daily as directed by coumadin clinic Patient taking differently: Take 1 tablet (5 MG) on Tuesday, Thursday, Saturday and Sunday. Take 1 1/2 tablet (7.5 MG) on Monday, Wednesday and friday 05/19/16  Yes Lelon Perla, MD  acetaminophen (TYLENOL) 325 MG tablet Take 2 tablets (650 mg total) by mouth every 6 (six) hours as needed for mild pain or moderate pain. Patient not taking: Reported on 05/15/2016 09/13/15   Oswald Hillock, MD    Current Facility-Administered Medications  Medication Dose Route Frequency Provider Last Rate Last Dose  . 0.9 %  sodium chloride infusion   Intravenous Continuous Ivor Costa, MD 75 mL/hr at  06/23/2016 0411    . 0.9 %  sodium chloride infusion   Intravenous Once Alphonsa Overall, MD      . 0.9 %  sodium chloride infusion   Intravenous Once Alphonsa Overall, MD      . acetaminophen (TYLENOL) tablet 650 mg  650 mg Oral Q6H PRN Ivor Costa, MD      . amLODipine (NORVASC) tablet 10 mg  10 mg Oral Daily Ivor Costa, MD      . calcium carbonate (OS-CAL - dosed in mg of elemental calcium) tablet 1,250 mg  1,250 mg Oral Daily Ivor Costa, MD      . cholecalciferol (VITAMIN D) tablet 1,000 Units  1,000 Units Oral Daily Ivor Costa, MD      . ferrous sulfate tablet 325 mg  325 mg Oral Daily Homer  Blaine Hamper, MD      . FLUoxetine (PROZAC) capsule 20 mg  20 mg Oral Daily Ivor Costa, MD      . hydrALAZINE (APRESOLINE) injection 5 mg  5 mg Intravenous Q2H PRN Ivor Costa, MD      . HYDROcodone-acetaminophen (NORCO/VICODIN) 5-325 MG per tablet 1 tablet  1 tablet Oral Q6H PRN Ivor Costa, MD      . magnesium oxide (MAG-OX) tablet 200 mg  200 mg Oral Daily Ivor Costa, MD      . morphine 4 MG/ML injection 2 mg  2 mg Intravenous Q4H PRN Ivor Costa, MD   2 mg at 06/13/2016 3614  . multivitamin with minerals tablet 1 tablet  1 tablet Oral Daily Ivor Costa, MD      . omega-3 acid ethyl esters (LOVAZA) capsule 1 g  1 capsule Oral BID Ivor Costa, MD      . ondansetron [facility]) injection 4 mg  4 mg Intravenous Q8H PRN Ivor Costa, MD      . pantoprazole (PROTONIX) 80 mg in sodium chloride 0.9 % 250 mL (0.32 mg/mL) infusion  8 mg/hr Intravenous Continuous Ivor Costa, MD 25 mL/hr at 06/16/2016 0545 8 mg/hr at 06/04/2016 0545  . sodium chloride flush (NS) 0.9 % injection 3 mL  3 mL Intravenous Q12H Ivor Costa, MD      . vitamin E capsule 400 Units  400 Units Oral Daily Ivor Costa, MD      . zolpidem (AMBIEN) tablet 5 mg  5 mg Oral QHS PRN Ivor Costa, MD        Allergies as of 06/19/2016 - Review Complete 06/16/2016  Allergen Reaction Noted  . Horse-derived products  07/11/2008    Family History  Problem Relation Age of Onset  . Stroke Mother   .  Coronary artery disease Father   . Heart disease Other     [place]  . Colon cancer Neg Hx     Social History   Social History  . Marital status: Widowed    Spouse name: N/A  . Number of children: 1  . Years of education: N/A   Occupational History  . retired Retired   Social History Main Topics  . Smoking status: Former Smoker    Quit date: 03/31/1972  . Smokeless tobacco: Never Used  . Alcohol use 0.6 oz/week    1 Shots of liquor per week     Comment: ocass  . Drug use: No  . Sexual activity: Not on file   Other Topics Concern  . Not on file   Social History Narrative   Alcohol use- yes occasional    Review of Systems: All systems reviewed and negative except where noted in HPI.  Physical Exam: Vital signs in last 24 hours: Temp:  [97.8 F (36.6 C)-98.4 F (36.9 C)] 98.4 F (36.9 C) (03/09 0536) Pulse Rate:  [62-72] 62 (03/09 0536) Resp:  [14-19] 18 (03/09 0536) BP: (107-166)/(61-74) 128/67 (03/09 0536) SpO2:  [94 %-100 %] 97 % (03/09 0536) Weight:  [177 lb 0.5 oz (80.3 kg)-180 lb (81.6 kg)] 177 lb 0.5 oz (80.3 kg) (03/09 0536) Last BM Date: 06/05/16 (as per patient -Had bloody stools) General:   Alert, well-developed, pleasant white male in NAD Eyes:  Sclera clear, no icterus.   Conjunctiva pink. Ears:  Normal auditory acuity. Nose:  No deformity, discharge,  or lesions.  Neck:  Supple; no masses  Lungs:  Clear throughout to auscultation.   No wheezes, crackles, or rhonchi.  Heart:  Regular rate and rhythm, 2+ BLE edema. Abdomen:  Soft, mildly distended with good BS. There is diffuse tenderness.   Rectal:  Deferred  Msk:  Symmetrical without gross deformities. . Neurologic:  Alert and  oriented x4;  grossly normal neurologically. Skin:  Intact without significant lesions or rashes.. Psych:  Alert and cooperative. Normal mood and affect.  Intake/Output from previous day: 03/08 0701 - 03/09 0700 In: 257.5 [I.V.:207.5; IV Piggyback:50] Out: 2 [Urine:1;  Stool:1] Intake/Output this shift: No intake/output data recorded.  Lab Results:  Recent Labs  06/22/2016 0223 06/25/2016 0406  WBC 8.3 9.2  HGB 11.7* 11.2*  HCT 34.7* 32.7*  PLT 181 183   BMET  Recent Labs  06/28/2016 0223 05/31/2016 0406  NA 140 141  K 3.9 4.3  CL 105 107  CO2 25 27  GLUCOSE 131* 110*  BUN 24* 26*  CREATININE 1.14 0.96  CALCIUM 9.3 9.4   PT/INR  Recent Labs  06/05/2016 0223 06/21/2016 0406  LABPROT 29.4* 29.1*  INR 2.72 2.68    Studies/Results: Ct Abdomen Pelvis W Contrast  Result Date: 06/20/2016 CLINICAL DATA:  Abdominal pain and weakness. Blood in stool. Anticoagulated. EXAM: CT ABDOMEN AND PELVIS WITH CONTRAST TECHNIQUE: Multidetector CT imaging of the abdomen and pelvis was performed using the standard protocol following bolus administration of intravenous contrast. CONTRAST:  100 mL Isovue 300 intravenous COMPARISON:  09/03/2015 FINDINGS: Lower chest: No acute abnormality. Hepatobiliary: Mild surface irregularities of the liver, raising the question of cirrhosis. No focal liver lesions. Gallbladder and bile ducts are unremarkable. Pancreas: Unremarkable. No pancreatic ductal dilatation or surrounding inflammatory changes. Spleen: Normal in size without focal abnormality. Adrenals/Urinary Tract: Adrenal glands are unremarkable. Kidneys are normal, without renal calculi, focal lesion, or hydronephrosis. Bladder is unremarkable. Stomach/Bowel: Fundoplication. Stomach is otherwise unremarkable. Small bowel is unremarkable. There is active hemorrhage in the proximal sigmoid mesocolon, proximal to the sigmoid anastomosis. There is blood surrounding the colon within the mesentery and continuing dependently into the peritoneum. Active hemorrhage is present at the time of this scan. Vascular/Lymphatic: The abdominal aorta is normal in caliber with moderate atherosclerotic calcification. No adenopathy is evident in the abdomen or pelvis. Reproductive: Unremarkable Other:  No bowel obstruction. No extraluminal gas. Sigmoid anastomosis appears unremarkable. Musculoskeletal: No significant skeletal lesion. Posterior decompression with instrumented fusion at L4-5. IMPRESSION: 1. Active hemorrhage in the proximal sigmoid mesocolon with moderately large volume of blood around the colon and collecting dependently in the peritoneum. 2. These results were called by telephone at the time of interpretation on 06/07/2016 at 5:23 am to Dr. Ivor Costa , who verbally acknowledged these results. 3. Mildly irregular liver, raising the question of cirrhosis. No focal liver lesion. Electronically Signed   By: Andreas Newport M.D.   On: 05/31/2016 05:23    Tye Savoy, NP-C @  06/14/2016, 9:16 AM  Pager number [phone]

## 2016-06-06 NOTE — ED Notes (Signed)
ED Provider at bedside. 

## 2016-06-06 NOTE — ED Notes (Signed)
Was unable to get pt's temp due to doctor being in the room. Tech made aware.

## 2016-06-07 ENCOUNTER — Inpatient Hospital Stay (HOSPITAL_COMMUNITY): Payer: Medicare Other

## 2016-06-07 ENCOUNTER — Encounter (HOSPITAL_COMMUNITY): Admission: EM | Disposition: E | Payer: Self-pay | Source: Home / Self Care | Attending: Family Medicine

## 2016-06-07 DIAGNOSIS — R079 Chest pain, unspecified: Secondary | ICD-10-CM

## 2016-06-07 DIAGNOSIS — K661 Hemoperitoneum: Secondary | ICD-10-CM

## 2016-06-07 LAB — CBC
HCT: 26.2 % — ABNORMAL LOW (ref 39.0–52.0)
HEMATOCRIT: 27 % — AB (ref 39.0–52.0)
HEMATOCRIT: 25.7 % — AB (ref 39.0–52.0)
HEMATOCRIT: 26.3 % — AB (ref 39.0–52.0)
HEMOGLOBIN: 8.7 g/dL — AB (ref 13.0–17.0)
HEMOGLOBIN: 8.8 g/dL — AB (ref 13.0–17.0)
HEMOGLOBIN: 9 g/dL — AB (ref 13.0–17.0)
Hemoglobin: 9.3 g/dL — ABNORMAL LOW (ref 13.0–17.0)
MCH: 32.6 pg (ref 26.0–34.0)
MCH: 31.1 pg (ref 26.0–34.0)
MCH: 32.4 pg (ref 26.0–34.0)
MCH: 32.4 pg (ref 26.0–34.0)
MCHC: 34.4 g/dL (ref 30.0–36.0)
MCHC: 33.2 g/dL (ref 30.0–36.0)
MCHC: 34.2 g/dL (ref 30.0–36.0)
MCHC: 34.2 g/dL (ref 30.0–36.0)
MCV: 94.7 fL (ref 78.0–100.0)
MCV: 93.6 fL (ref 78.0–100.0)
MCV: 94.5 fL (ref 78.0–100.0)
MCV: 94.6 fL (ref 78.0–100.0)
Platelets: 160 10*3/uL (ref 150–400)
Platelets: 141 10*3/uL — ABNORMAL LOW (ref 150–400)
Platelets: 127 10*3/uL — ABNORMAL LOW (ref 150–400)
Platelets: 140 10*3/uL — ABNORMAL LOW (ref 150–400)
RBC: 2.85 MIL/uL — AB (ref 4.22–5.81)
RBC: 2.8 MIL/uL — AB (ref 4.22–5.81)
RBC: 2.72 MIL/uL — AB (ref 4.22–5.81)
RBC: 2.78 MIL/uL — AB (ref 4.22–5.81)
RDW: 14.6 % (ref 11.5–15.5)
RDW: 14.7 % (ref 11.5–15.5)
RDW: 15 % (ref 11.5–15.5)
RDW: 14.9 % (ref 11.5–15.5)
WBC: 13.6 10*3/uL — AB (ref 4.0–10.5)
WBC: 11.8 10*3/uL — AB (ref 4.0–10.5)
WBC: 12.3 10*3/uL — AB (ref 4.0–10.5)
WBC: 12.2 10*3/uL — ABNORMAL HIGH (ref 4.0–10.5)

## 2016-06-07 LAB — ECHOCARDIOGRAM COMPLETE
AOPV: 0.82 m/s
AOVTI: 38.4 cm
AV Area mean vel: 2.81 cm2
AV Peak grad: 12 mmHg
AV VEL mean LVOT/AV: 0.89
AV peak Index: 1.28
AV pk vel: 174 cm/s
AVAREAMEANVIN: 1.39 cm2/m2
AVAREAVTI: 2.58 cm2
AVAREAVTIIND: 1.24 cm2/m2
AVG: 7 mmHg
AVLVOTPG: 8 mmHg
AVPHT: 482 ms
CHL CUP AV VALUE AREA INDEX: 1.24
CHL CUP AV VEL: 2.5
CHL CUP DOP CALC LVOT VTI: 30.6 cm
CHL CUP RV SYS PRESS: 61 mmHg
CHL CUP TV REG PEAK VELOCITY: 340 cm/s
DOP CAL AO MEAN VELOCITY: 114 cm/s
EWDT: 176 ms
FS: 31 % (ref 28–44)
HEIGHTINCHES: 71.5 in
IV/PV OW: 0.98
LA diam index: 2.48 cm/m2
LASIZE: 50 mm
LAVOLA4C: 150 mL
LEFT ATRIUM END SYS DIAM: 50 mm
LV dias vol: 103 mL (ref 62–150)
LVDIAVOLIN: 51 mL/m2
LVOT area: 3.14 cm2
LVOT diameter: 20 mm
LVOT peak VTI: 0.8 cm
LVOTPV: 143 cm/s
LVOTSV: 96 mL
LVSYSVOL: 35 mL (ref 21–61)
LVSYSVOLIN: 17 mL/m2
MV Dec: 176
MV Peak grad: 6 mmHg
MV VTI: 143 cm
MV pk E vel: 123 m/s
PW: 13.1 mm — AB (ref 0.6–1.1)
Simpson's disk: 66
Stroke v: 68 ml
TAPSE: 15.5 mm
TR max vel: 340 cm/s
Valve area: 2.5 cm2
Weight: 2832.47 oz

## 2016-06-07 LAB — GLUCOSE, CAPILLARY: Glucose-Capillary: 115 mg/dL — ABNORMAL HIGH (ref 65–99)

## 2016-06-07 LAB — BASIC METABOLIC PANEL
ANION GAP: 7 (ref 5–15)
BUN: 24 mg/dL — ABNORMAL HIGH (ref 6–20)
CHLORIDE: 109 mmol/L (ref 101–111)
CO2: 24 mmol/L (ref 22–32)
Calcium: 8.5 mg/dL — ABNORMAL LOW (ref 8.9–10.3)
Creatinine, Ser: 0.91 mg/dL (ref 0.61–1.24)
GFR calc Af Amer: >60 mL/min (ref >60)
GFR calc non Af Amer: >60 mL/min (ref >60)
GLUCOSE: 145 mg/dL — AB (ref 65–99)
POTASSIUM: 3.4 mmol/L — AB (ref 3.5–5.1)
Sodium: 140 mmol/L (ref 135–145)

## 2016-06-07 LAB — PROTIME-INR
INR: 1.47
Prothrombin Time: 18 seconds — ABNORMAL HIGH (ref 11.4–15.2)

## 2016-06-07 LAB — MAGNESIUM: Magnesium: 1.7 mg/dL (ref 1.7–2.4)

## 2016-06-07 SURGERY — SIGMOIDOSCOPY, FLEXIBLE
Anesthesia: Monitor Anesthesia Care

## 2016-06-07 MED ORDER — SODIUM CHLORIDE 0.9 % IV SOLN: 30.0000 meq | Freq: Once | INTRAVENOUS | Status: DC

## 2016-06-07 MED ORDER — FAMOTIDINE IN NACL 20-0.9 MG/50ML-% IV SOLN
20.0000 mg | INTRAVENOUS | Status: DC
  Administered 2016-06-07: 20 mg via INTRAVENOUS
  Filled 2016-06-07: qty 50

## 2016-06-07 MED ORDER — FENTANYL CITRATE (PF) 100 MCG/2ML IJ SOLN
25.0000 ug | INTRAMUSCULAR | Status: DC | PRN
  Administered 2016-06-07 – 2016-06-09 (×9): 25 ug via INTRAVENOUS
  Filled 2016-06-07 (×9): qty 2

## 2016-06-07 MED ORDER — KETOROLAC TROMETHAMINE 30 MG/ML IJ SOLN: 15.0000 mg | Freq: Once | INTRAMUSCULAR | Status: DC

## 2016-06-07 MED ORDER — POTASSIUM CHLORIDE 10 MEQ/100ML IV SOLN
10.0000 meq | INTRAVENOUS | Status: AC
  Administered 2016-06-07 (×3): 10 meq via INTRAVENOUS
  Filled 2016-06-07 (×3): qty 100

## 2016-06-07 MED ORDER — FUROSEMIDE 10 MG/ML IJ SOLN
40.0000 mg | Freq: Once | INTRAMUSCULAR | Status: AC
  Administered 2016-06-07: 40 mg via INTRAVENOUS
  Filled 2016-06-07: qty 4

## 2016-06-07 NOTE — Progress Notes (Signed)
*  PRELIMINARY RESULTS* Echocardiogram 2D Echocardiogram has been performed.  Ian Sullivan 06/16/2016, 9:50 AM

## 2016-06-07 NOTE — Progress Notes (Signed)
Dr. Tamala Julian notified of pt having temp 103.1. Orders received to give IV toradol 15mg   x1.

## 2016-06-07 NOTE — Progress Notes (Signed)
Daily Rounding Note  06/06/2016, 8:45 AM  LOS: 1 day   SUBJECTIVE:   Chief complaint:  Hematochezia.  Abdominal pain.     Latest stool was ~ 10 or 11 PM last night and was brown.  No further blood PR.  Pain, mostly in lower abdomen, continues.  Hurts to laugh, cough.  Some nausea and dry heave last night, no nausea currently.  Says he can not throw up ever since fundoplication in 5852D Pt's wife at bedside.  Echocardiogram in progress.   OBJECTIVE:         Vital signs in last 24 hours:    Temp:  [98 F (36.7 C)-100.1 F (37.8 C)] 99.2 F (37.3 C) (03/10 0800) Pulse Rate:  [56-82] 64 (03/10 0800) Resp:  [18-28] 25 (03/10 0800) BP: (128-174)/(47-68) 155/64 (03/10 0800) SpO2:  [86 %-98 %] 86 % (03/10 0800) Last BM Date: 06/23/2016 Filed Weights   06/05/2016 0235 06/02/2016 0536  Weight: 81.6 kg (180 lb) 80.3 kg (177 lb 0.5 oz)   General: aged, alert, comfortable   Heart: Irreg, irreg.   Rate in 70s Chest: clear bil in front Abdomen: distended/protuberant.  BS hypoactive but not tinkling or tympanitic.  Tender diffusely.    Extremities: no CCE Neuro/Psych:  Oriented x  3.  Fully alert.  Pleasant, calm.  Moves all 4s.  No gross deficits.   Intake/Output from previous day: 03/09 0701 - 03/10 0700 In: 2113.1 [I.V.:1092.1; Blood:1021] Out: 575 [Urine:575]  Intake/Output this shift: No intake/output data recorded.  Lab Results:  Recent Labs  06/02/2016 1602 06/22/2016 0338 06/25/2016 0704  WBC 11.0* 13.6* 11.8*  HGB 8.5* 9.3* 8.7*  HCT 24.6* 27.0* 26.2*  PLT 146* 160 141*   BMET  Recent Labs  06/01/2016 0223 06/05/2016 0406 06/22/2016 0338  NA 140 141 140  K 3.9 4.3 3.4*  CL 105 107 109  CO2 25 27 24   GLUCOSE 131* 110* 145*  BUN 24* 26* 24*  CREATININE 1.14 0.96 0.91  CALCIUM 9.3 9.4 8.5*   LFT No results for input(s): PROT, ALBUMIN, AST, ALT, ALKPHOS, BILITOT, BILIDIR, IBILI in the last 72  hours. PT/INR  Recent Labs  06/16/2016 1415 06/03/2016 0338  LABPROT 18.9* 18.0*  INR 1.57 1.47   Hepatitis Panel No results for input(s): HEPBSAG, HCVAB, HEPAIGM, HEPBIGM in the last 72 hours.  Studies/Results: Ct Abdomen Pelvis W Contrast  Result Date: 06/15/2016 CLINICAL DATA:  Abdominal pain and weakness. Blood in stool. Anticoagulated. EXAM: CT ABDOMEN AND PELVIS WITH CONTRAST TECHNIQUE: Multidetector CT imaging of the abdomen and pelvis was performed using the standard protocol following bolus administration of intravenous contrast. CONTRAST:  100 mL Isovue 300 intravenous COMPARISON:  09/03/2015 FINDINGS: Lower chest: No acute abnormality. Hepatobiliary: Mild surface irregularities of the liver, raising the question of cirrhosis. No focal liver lesions. Gallbladder and bile ducts are unremarkable. Pancreas: Unremarkable. No pancreatic ductal dilatation or surrounding inflammatory changes. Spleen: Normal in size without focal abnormality. Adrenals/Urinary Tract: Adrenal glands are unremarkable. Kidneys are normal, without renal calculi, focal lesion, or hydronephrosis. Bladder is unremarkable. Stomach/Bowel: Fundoplication. Stomach is otherwise unremarkable. Small bowel is unremarkable. There is active hemorrhage in the proximal sigmoid mesocolon, proximal to the sigmoid anastomosis. There is blood surrounding the colon within the mesentery and continuing dependently into the peritoneum. Active hemorrhage is present at the time of this scan. Vascular/Lymphatic: The abdominal aorta is normal in caliber with moderate atherosclerotic calcification. No adenopathy is evident in the  abdomen or pelvis. Reproductive: Unremarkable Other: No bowel obstruction. No extraluminal gas. Sigmoid anastomosis appears unremarkable. Musculoskeletal: No significant skeletal lesion. Posterior decompression with instrumented fusion at L4-5. IMPRESSION: 1. Active hemorrhage in the proximal sigmoid mesocolon with moderately  large volume of blood around the colon and collecting dependently in the peritoneum. 2. These results were called by telephone at the time of interpretation on 06/21/2016 at 5:23 am to Dr. Ivor Costa , who verbally acknowledged these results. 3. Mildly irregular liver, raising the question of cirrhosis. No focal liver lesion. Electronically Signed   By: Andreas Newport M.D.   On: 06/04/2016 05:23    ASSESMENT:   *  Hematochezia. Previous 08/2015 sigmoid colectomy for sigmoid volvulus  CT shows active bleeding at sigmoid both intraluminal and peritoneal.  ? Arterial vs venous source?  On IV Protonix drip.    *  Bleeding associated peritonitis.    *  Chronic Coumadin for A fib.  INR 2.7 >> 1.4.  S/p FFP x 2, Vitamin K 10 mg IV x 1.    *  ABL anemia.  S/p PRBC x 1.  Takes PO iron at home but no signif anemia in the past 9 months.    *  ? Cirrhosis, per current CT   PLAN   *  Does not need IV PPI drip for LGIB.  No PPI at home PTA.  Will switch to Pepcid.      Azucena Freed  06/03/2016, 8:45 AM Pager: [phone]

## 2016-06-07 NOTE — Progress Notes (Signed)
Subjective: Complains of bloating  Objective: Vital signs in last 24 hours: Temp:  [98 F (36.7 C)-100.1 F (37.8 C)] 99.2 F (37.3 C) (03/10 0800) Pulse Rate:  [56-82] 64 (03/10 0800) Resp:  [18-28] 25 (03/10 0800) BP: (128-174)/(47-68) 155/64 (03/10 0800) SpO2:  [86 %-98 %] 86 % (03/10 0800) Last BM Date: 06/19/2016  Intake/Output from previous day: 03/09 0701 - 03/10 0700 In: 2113.1 [I.V.:1092.1; Blood:1021] Out: 575 [Urine:575] Intake/Output this shift: No intake/output data recorded.  Resp: clear to auscultation bilaterally Cardio: regular rate and rhythm GI: soft upper abd. tender over lower abd. some distension. no further blood per rectum  Lab Results:   Recent Labs  06/25/2016 0338 06/06/2016 0704  WBC 13.6* 11.8*  HGB 9.3* 8.7*  HCT 27.0* 26.2*  PLT 160 141*   BMET  Recent Labs  06/04/2016 0406 06/21/2016 0338  NA 141 140  K 4.3 3.4*  CL 107 109  CO2 27 24  GLUCOSE 110* 145*  BUN 26* 24*  CREATININE 0.96 0.91  CALCIUM 9.4 8.5*   PT/INR  Recent Labs  06/21/2016 1415 06/10/2016 0338  LABPROT 18.9* 18.0*  INR 1.57 1.47   ABG No results for input(s): PHART, HCO3 in the last 72 hours.  Invalid input(s): PCO2, PO2  Studies/Results: Ct Abdomen Pelvis W Contrast  Result Date: 05/31/2016 CLINICAL DATA:  Abdominal pain and weakness. Blood in stool. Anticoagulated. EXAM: CT ABDOMEN AND PELVIS WITH CONTRAST TECHNIQUE: Multidetector CT imaging of the abdomen and pelvis was performed using the standard protocol following bolus administration of intravenous contrast. CONTRAST:  100 mL Isovue 300 intravenous COMPARISON:  09/03/2015 FINDINGS: Lower chest: No acute abnormality. Hepatobiliary: Mild surface irregularities of the liver, raising the question of cirrhosis. No focal liver lesions. Gallbladder and bile ducts are unremarkable. Pancreas: Unremarkable. No pancreatic ductal dilatation or surrounding inflammatory changes. Spleen: Normal in size without focal  abnormality. Adrenals/Urinary Tract: Adrenal glands are unremarkable. Kidneys are normal, without renal calculi, focal lesion, or hydronephrosis. Bladder is unremarkable. Stomach/Bowel: Fundoplication. Stomach is otherwise unremarkable. Small bowel is unremarkable. There is active hemorrhage in the proximal sigmoid mesocolon, proximal to the sigmoid anastomosis. There is blood surrounding the colon within the mesentery and continuing dependently into the peritoneum. Active hemorrhage is present at the time of this scan. Vascular/Lymphatic: The abdominal aorta is normal in caliber with moderate atherosclerotic calcification. No adenopathy is evident in the abdomen or pelvis. Reproductive: Unremarkable Other: No bowel obstruction. No extraluminal gas. Sigmoid anastomosis appears unremarkable. Musculoskeletal: No significant skeletal lesion. Posterior decompression with instrumented fusion at L4-5. IMPRESSION: 1. Active hemorrhage in the proximal sigmoid mesocolon with moderately large volume of blood around the colon and collecting dependently in the peritoneum. 2. These results were called by telephone at the time of interpretation on 06/20/2016 at 5:23 am to Dr. Ivor Costa , who verbally acknowledged these results. 3. Mildly irregular liver, raising the question of cirrhosis. No focal liver lesion. Electronically Signed   By: Andreas Newport M.D.   On: 06/02/2016 05:23    Anti-infectives: Anti-infectives    None      Assessment/Plan: s/p * No surgery found * Continue bowel rest  Seems to be hemodynamically stable. Hg seems to be leveling out I suspect he has some element of partial blockage from the hematoma around colon but this should resolve as hematoma breaks down. If he has stopped bleeding then no role for IR. Could consider endoscopy to look at colon or help decompress. Will talk with GI about this and  timing. Will continue to monitor closely but not planning urgent surgery as this point  LOS: 1  day    TOTH III,Zimere Dunlevy S 06/10/2016

## 2016-06-07 NOTE — Progress Notes (Signed)
PROGRESS NOTE [facility]   Judie Bonus.   IWP:[id number] DOB: [birth year]  DOA: 06/28/2016 PCP: [name], MD   Brief Narrative:  83 year old male with medical history significant for A. 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 CT of the abdomen was performed showing intraluminal and extraluminal breathing with hematoma around the sigmoid colon. Patient admitted for surgical and gastrointestinal evaluation and to monitor hemoglobin. Warfarin was reversed with vitamin K and fresh frozen plasma. Patient was transfused 1 unit of PRBCs and hemoglobin looks that is leveling out. Continue conservative management for the time being.  Subjective: Patient seen and examined with wife and gen surgery at bedside. No bloody stools since last night, mild increase in abdominal distension. Pain is well controlled with medication although patient does not like how morphine make him feel. Patient saturation decreasing and he is c/o of difficulty breathing. Denies chest pain and palpitations.   Assessment & Plan: Intra-and ?extra luminal colonic GI bleed with peritoneal bleed - unclear etiology, patient on warfarin. INR therapeutic, patient have recent fall, but no significant abdominal trauma History of volvulus, no signs of perforation case discussed with radiology. INR subtherapeutic  Surgery recommendations appreciated - continue to monitor Monitor CBC q 6hrs  Transfuse if Hgb < 8 or symptomatic anemia at any point GI recommendations appreciated - possible endoscopy vs flex sig - but monitor for now - Placed on Pepcid Case discussed with IR - Think that is more retroperitoneal bleed rather than intraperitoneal. No intervention at this point - will continue to monitor  Keep in step down   Chronic diastolic HF -  some signs of fluid overload, patient received aggressive fluid resuscitation   Will obtain a CXR  Give IV Lasix 40 mg OTO and monitor  O2 supplement as need  Will obtain an updated ECHO - Last one on 2016 showed EF 50-53% with LV diastolic dysfunction.  Incentive spirometry   Afib - CHADVASCS 4 - On coumadin at home, HR stable  Hold A/c for now active bleeding - patient eventually need a/c given high risk will discuss with cardiology when GI bleed has resolve Continue BB for now  Continue tele   HTN - BP stable  Continue home medication   Depression  Continue home medications  Chronic back pain  Stable   DVT prophylaxis: SCD's Code Status: FULL  Family Communication: Wife at bedside Disposition Plan: Home when medically stable   Consultants:   GI - Opdyke  Gen Surgery   Procedures:   None   Antimicrobials:  None    Objective: Vitals:   06/05/2016 0340 06/15/2016 0600 06/18/2016 0705 06/14/2016 0800  BP:  (!) 150/57 (!) 146/51 (!) 155/64  Pulse:  64 68 64  Resp:  20 19 (!) 25  Temp: 98.8 F (37.1 C)   99.2 F (37.3 C)  TempSrc: Oral   Oral  SpO2:   (!) 89% (!) 86%  Weight:      Height:        Intake/Output Summary (Last 24 hours) at 06/08/2016 0835 Last data filed at 06/12/2016 0600  Gross per 24 hour  Intake          2113.08 ml  Output              575 ml  Net          1538.08  ml   Filed Weights   06/04/2016 0235 06/03/2016 0536  Weight: 81.6 kg (180 lb) 80.3 kg (177 lb 0.5 oz)    Examination:  General exam: NAD  HEENT: OP clear  Respiratory system: Good air entry, bibasilar crackles, no wheezing  Cardiovascular system: S1S2 Irr Irr no murmurs  Gastrointestinal system: Abdomen soft, moderate distention, diffuse tenderness, no guarding  Central nervous system: Alert and oriented. No focal neurological deficits. Extremities: 1+ LE edema  Skin: LE discoloration chronic changes  Psychiatry: Judgement and insight appear normal. Mood & affect appropriate.     Data Reviewed: I have personally reviewed following labs and imaging studies  CBC:  Recent Labs Lab 06/23/2016 0223 06/01/2016 0406 06/01/2016 1146 06/10/2016 1602 06/08/2016 0338 06/12/2016 0704  WBC 8.3 9.2 9.5 11.0* 13.6* 11.8*  NEUTROABS 6.7  --   --   --   --   --   HGB 11.7* 11.2* 9.7* 8.5* 9.3* 8.7*  HCT 34.7* 32.7* 28.4* 24.6* 27.0* 26.2*  MCV 94.6 94.0 94.7 95.0 94.7 93.6  PLT 181 183 171 146* 160 426*   Basic Metabolic Panel:  Recent Labs Lab 06/20/2016 0223 06/19/2016 0406 06/05/2016 0338 06/02/2016 0702  NA 140 141 140  --   K 3.9 4.3 3.4*  --   CL 105 107 109  --   CO2 25 27 24   --   GLUCOSE 131* 110* 145*  --   BUN 24* 26* 24*  --   CREATININE 1.14 0.96 0.91  --   CALCIUM 9.3 9.4 8.5*  --   MG  --   --   --  1.7   GFR: Estimated Creatinine Clearance: 63 mL/min (by C-G formula based on SCr of 0.91 mg/dL). Liver Function Tests: No results for input(s): AST, ALT, ALKPHOS, BILITOT, PROT, ALBUMIN in the last 168 hours.  Recent Labs Lab 05/29/2016 0406  LIPASE 28   No results for input(s): AMMONIA in the last 168 hours. Coagulation Profile:  Recent Labs Lab 06/23/2016 0223 06/27/2016 0406 06/08/2016 1415 06/07/16 0338  INR 2.72 2.68 1.57 1.47   Cardiac Enzymes: No results for input(s): CKTOTAL, CKMB, CKMBINDEX, TROPONINI in the last 168 hours. BNP (last 3 results) No results for input(s): PROBNP in the last 8760 hours. HbA1C: No results for input(s): HGBA1C in the last 72 hours. CBG:  Recent Labs Lab 06/14/2016 0750  GLUCAP 123*   Lipid Profile: No results for input(s): CHOL, HDL, LDLCALC, TRIG, CHOLHDL, LDLDIRECT in the last 72 hours. Thyroid Function Tests: No results for input(s): TSH, T4TOTAL, FREET4, T3FREE, THYROIDAB in the last 72 hours. Anemia Panel: No results for input(s): VITAMINB12, FOLATE, FERRITIN, TIBC, IRON, RETICCTPCT in the last 72 hours. Sepsis Labs: No results for input(s): PROCALCITON, LATICACIDVEN in the last 168 hours.  Recent  Results (from the past 240 hour(s))  MRSA PCR Screening     Status: None   Collection Time: 05/30/2016 12:20 PM  Result Value Ref Range Status   MRSA by PCR NEGATIVE NEGATIVE Final    Comment:        The GeneXpert MRSA Assay (FDA approved for NASAL specimens only), is one component of a comprehensive MRSA colonization surveillance program. It is not intended to diagnose MRSA infection nor to guide or monitor treatment for MRSA infections.        Radiology Studies: Ct Abdomen Pelvis W Contrast  Result Date: 06/15/2016 CLINICAL DATA:  Abdominal pain and weakness. Blood in stool. Anticoagulated. EXAM: CT ABDOMEN AND PELVIS WITH CONTRAST TECHNIQUE: Multidetector  CT imaging of the abdomen and pelvis was performed using the standard protocol following bolus administration of intravenous contrast. CONTRAST:  100 mL Isovue 300 intravenous COMPARISON:  09/03/2015 FINDINGS: Lower chest: No acute abnormality. Hepatobiliary: Mild surface irregularities of the liver, raising the question of cirrhosis. No focal liver lesions. Gallbladder and bile ducts are unremarkable. Pancreas: Unremarkable. No pancreatic ductal dilatation or surrounding inflammatory changes. Spleen: Normal in size without focal abnormality. Adrenals/Urinary Tract: Adrenal glands are unremarkable. Kidneys are normal, without renal calculi, focal lesion, or hydronephrosis. Bladder is unremarkable. Stomach/Bowel: Fundoplication. Stomach is otherwise unremarkable. Small bowel is unremarkable. There is active hemorrhage in the proximal sigmoid mesocolon, proximal to the sigmoid anastomosis. There is blood surrounding the colon within the mesentery and continuing dependently into the peritoneum. Active hemorrhage is present at the time of this scan. Vascular/Lymphatic: The abdominal aorta is normal in caliber with moderate atherosclerotic calcification. No adenopathy is evident in the abdomen or pelvis. Reproductive: Unremarkable Other: No bowel  obstruction. No extraluminal gas. Sigmoid anastomosis appears unremarkable. Musculoskeletal: No significant skeletal lesion. Posterior decompression with instrumented fusion at L4-5. IMPRESSION: 1. Active hemorrhage in the proximal sigmoid mesocolon with moderately large volume of blood around the colon and collecting dependently in the peritoneum. 2. These results were called by telephone at the time of interpretation on 06/26/2016 at 5:23 am to Dr. Ivor Costa , who verbally acknowledged these results. 3. Mildly irregular liver, raising the question of cirrhosis. No focal liver lesion. Electronically Signed   By: Andreas Newport M.D.   On: 06/16/2016 05:23      Scheduled Meds: . sodium chloride   Intravenous Once  . amLODipine  10 mg Oral Daily  . calcium carbonate  1,250 mg Oral Daily  . cholecalciferol  1,000 Units Oral Daily  . ferrous sulfate  325 mg Oral Daily  . FLUoxetine  20 mg Oral Daily  . furosemide  40 mg Intravenous Once  . magnesium oxide  200 mg Oral Daily  . multivitamin with minerals  1 tablet Oral Daily  . omega-3 acid ethyl esters  1 capsule Oral BID  . potassium chloride  10 mEq Intravenous Q1 Hr x 3  . sodium chloride flush  3 mL Intravenous Q12H  . vitamin E  400 Units Oral Daily   Continuous Infusions: . pantoprozole (PROTONIX) infusion 8 mg/hr (06/05/2016 0752)     LOS: 1 day    Chipper Oman, MD Pager: Text Page via [email or web address]  [phone]  If 7PM-7AM, please contact night-coverage [email or web address] Password [facility] 06/01/2016, 8:35 AM

## 2016-06-08 ENCOUNTER — Inpatient Hospital Stay (HOSPITAL_COMMUNITY): Payer: Medicare Other

## 2016-06-08 DIAGNOSIS — J181 Lobar pneumonia, unspecified organism: Secondary | ICD-10-CM

## 2016-06-08 DIAGNOSIS — K625 Hemorrhage of anus and rectum: Secondary | ICD-10-CM

## 2016-06-08 DIAGNOSIS — J189 Pneumonia, unspecified organism: Secondary | ICD-10-CM

## 2016-06-08 DIAGNOSIS — R0602 Shortness of breath: Secondary | ICD-10-CM

## 2016-06-08 LAB — CBC
HCT: 24.6 % — ABNORMAL LOW (ref 39.0–52.0)
HCT: 27.9 % — ABNORMAL LOW (ref 39.0–52.0)
HEMOGLOBIN: 8.5 g/dL — AB (ref 13.0–17.0)
Hemoglobin: 9.4 g/dL — ABNORMAL LOW (ref 13.0–17.0)
MCH: 32.7 pg (ref 26.0–34.0)
MCH: 32.2 pg (ref 26.0–34.0)
MCHC: 34.6 g/dL (ref 30.0–36.0)
MCHC: 33.7 g/dL (ref 30.0–36.0)
MCV: 94.6 fL (ref 78.0–100.0)
MCV: 95.5 fL (ref 78.0–100.0)
PLATELETS: 149 10*3/uL — AB (ref 150–400)
Platelets: 129 10*3/uL — ABNORMAL LOW (ref 150–400)
RBC: 2.6 MIL/uL — ABNORMAL LOW (ref 4.22–5.81)
RBC: 2.92 MIL/uL — ABNORMAL LOW (ref 4.22–5.81)
RDW: 14.9 % (ref 11.5–15.5)
RDW: 14.8 % (ref 11.5–15.5)
WBC: 9 10*3/uL (ref 4.0–10.5)
WBC: 8.3 10*3/uL (ref 4.0–10.5)

## 2016-06-08 LAB — BASIC METABOLIC PANEL
Anion gap: 7 (ref 5–15)
BUN: 26 mg/dL — ABNORMAL HIGH (ref 6–20)
CALCIUM: 8.5 mg/dL — AB (ref 8.9–10.3)
CO2: 27 mmol/L (ref 22–32)
CREATININE: 0.9 mg/dL (ref 0.61–1.24)
Chloride: 104 mmol/L (ref 101–111)
GFR calc Af Amer: >60 mL/min (ref >60)
Glucose, Bld: 131 mg/dL — ABNORMAL HIGH (ref 65–99)
POTASSIUM: 3.2 mmol/L — AB (ref 3.5–5.1)
SODIUM: 138 mmol/L (ref 135–145)

## 2016-06-08 LAB — PROTIME-INR
INR: 1.36
PROTHROMBIN TIME: 16.9 s — AB (ref 11.4–15.2)

## 2016-06-08 LAB — GLUCOSE, CAPILLARY: Glucose-Capillary: 124 mg/dL — ABNORMAL HIGH (ref 65–99)

## 2016-06-08 LAB — MAGNESIUM: MAGNESIUM: 1.8 mg/dL (ref 1.7–2.4)

## 2016-06-08 MED ORDER — FUROSEMIDE 40 MG PO TABS
40.0000 mg | ORAL_TABLET | Freq: Every day | ORAL | Status: DC
  Administered 2016-06-08 – 2016-06-10 (×3): 40 mg via ORAL
  Filled 2016-06-08 (×3): qty 1

## 2016-06-08 MED ORDER — SODIUM CHLORIDE 0.9 % IV SOLN
Freq: Once | INTRAVENOUS | Status: AC
  Administered 2016-06-08: 09:00:00 via INTRAVENOUS
  Filled 2016-06-08: qty 1000

## 2016-06-08 MED ORDER — PIPERACILLIN-TAZOBACTAM 3.375 G IVPB 30 MIN
3.3750 g | Freq: Once | INTRAVENOUS | Status: AC
  Administered 2016-06-08: 3.375 g via INTRAVENOUS
  Filled 2016-06-08 (×2): qty 50

## 2016-06-08 MED ORDER — PANTOPRAZOLE SODIUM 40 MG PO TBEC
40.0000 mg | DELAYED_RELEASE_TABLET | Freq: Every day | ORAL | Status: DC
  Administered 2016-06-08 – 2016-06-10 (×2): 40 mg via ORAL
  Filled 2016-06-08 (×2): qty 1

## 2016-06-08 MED ORDER — PIPERACILLIN-TAZOBACTAM 3.375 G IVPB
3.3750 g | Freq: Three times a day (TID) | INTRAVENOUS | Status: DC
  Administered 2016-06-09 – 2016-06-10 (×6): 3.375 g via INTRAVENOUS
  Filled 2016-06-08 (×9): qty 50

## 2016-06-08 NOTE — Progress Notes (Signed)
Notified Dr. Quincy Simmonds of CT scan findings.

## 2016-06-08 NOTE — Progress Notes (Signed)
Daily Rounding Note  06/08/2016, 8:23 AM  LOS: 2 days   SUBJECTIVE:   Chief complaint:  Pain in LLQ.  No further bloody stools, all BMs yesterday were brown.  No nausea.  Clear diet ordered.      OBJECTIVE:         Vital signs in last 24 hours:    Temp:  [97.9 F (36.6 C)-98.8 F (37.1 C)] 98.7 F (37.1 C) (03/11 0741) Pulse Rate:  [68-82] 75 (03/11 0800) Resp:  [19-27] 25 (03/11 0800) BP: (145-165)/(48-77) 154/70 (03/11 0800) SpO2:  [86 %-96 %] 96 % (03/11 0800) Last BM Date: 06/16/2016 Filed Weights   06/01/2016 0235 06/22/2016 0536  Weight: 81.6 kg (180 lb) 80.3 kg (177 lb 0.5 oz)   General: pleasant, comfortable.  Fully alert   Heart: RRR.  No mrg Chest: clear bil.   Abdomen: soft, moderately distended.  Tenderness LLQ>>> RLQ.  BS hypoactive.   Extremities: no CCE Neuro/Psych:  Oriented x 3.  Fully alert.  No gross deficits or tremor.     Intake/Output from previous day: 03/10 0701 - 03/11 0700 In: 143.8 [I.V.:93.8; IV Piggyback:50] Out: 6222 [Urine:1820]  Intake/Output this shift: No intake/output data recorded.  Lab Results:  Recent Labs  05/29/2016 1003 06/21/2016 1535 06/08/16 0356  WBC 12.3* 12.2* 9.0  HGB 8.8* 9.0* 8.5*  HCT 25.7* 26.3* 24.6*  PLT 127* 140* 129*   BMET  Recent Labs  06/28/2016 0406 06/14/2016 0338 06/08/16 0356  NA 141 140 138  K 4.3 3.4* 3.2*  CL 107 109 104  CO2 27 24 27   GLUCOSE 110* 145* 131*  BUN 26* 24* 26*  CREATININE 0.96 0.91 0.90  CALCIUM 9.4 8.5* 8.5*   LFT No results for input(s): PROT, ALBUMIN, AST, ALT, ALKPHOS, BILITOT, BILIDIR, IBILI in the last 72 hours. PT/INR  Recent Labs  06/12/2016 0338 06/08/16 0356  LABPROT 18.0* 16.9*  INR 1.47 1.36   Hepatitis Panel No results for input(s): HEPBSAG, HCVAB, HEPAIGM, HEPBIGM in the last 72 hours.  Studies/Results: Dg Chest Port 1 View  Result Date: 05/29/2016 CLINICAL DATA:  SOB today EXAM: PORTABLE CHEST  - 1 VIEW COMPARISON:  09/03/2015 FINDINGS: Low lung volumes with Coarse interstitial markings in the lung bases right greater than left, slightly more prominent than on previous exam. Coarse perihilar bronchovascular markings. Stable cardiomegaly.  Atheromatous aorta. No effusion.  No pneumothorax. Visualized bones unremarkable. Surgical clips, left upper abdomen. Dorsal stimulator catheters project over the lower thoracic spine as before. IMPRESSION: 1. Low volumes with some increase in bibasilar atelectasis. 2. Stable cardiomegaly and postop changes. Electronically Signed   By: Lucrezia Europe M.D.   On: 06/20/2016 09:43   Dg Abd Portable 1v  Result Date: 06/15/2016 CLINICAL DATA:  Abdominal distension EXAM: PORTABLE ABDOMEN - 1 VIEW COMPARISON:  06/10/2016 FINDINGS: Scattered large and small bowel gas is noted. The stomach is distended with air is well. Fecal material is noted throughout the colon similar to that seen on recent CT examination. Postsurgical changes in the lower lumbar spine are noted. IMPRESSION: Gaseous distension of the stomach. Air in the colon similar to recent CT. Electronically Signed   By: Inez Catalina M.D.   On: 06/10/2016 11:40   Scheduled Meds: . amLODipine  10 mg Oral Daily  . calcium carbonate  1,250 mg Oral Daily  . cholecalciferol  1,000 Units Oral Daily  . famotidine (PEPCID) IV  20 mg Intravenous Q24H  .  ferrous sulfate  325 mg Oral Daily  . FLUoxetine  20 mg Oral Daily  . magnesium oxide  200 mg Oral Daily  . multivitamin with minerals  1 tablet Oral Daily  . omega-3 acid ethyl esters  1 capsule Oral BID  . 0.9 % sodium chloride with kcl   Intravenous Once  . sodium chloride flush  3 mL Intravenous Q12H  . vitamin E  400 Units Oral Daily   Continuous Infusions: PRN Meds:.acetaminophen, fentaNYL (SUBLIMAZE) injection, hydrALAZINE, HYDROcodone-acetaminophen, ondansetron, zolpidem  ASSESMENT:   *  Hematochezia. Previous 08/2015 sigmoid colectomy for sigmoid  volvulus  CT with active bleeding at sigmoid both intraluminal and peritoneal.  ? Arterial vs venous source?    *  Bleeding associated peritonitis.    *  Chronic Coumadin for A fib.  INR 2.7 >> 1.4.  S/p FFP x 2, Vitamin K 10 mg IV x 1.    *  ABL anemia.  S/p PRBC x 1.  Takes PO iron at home but no signif anemia in the past 9 months.    *  ? Cirrhosis, per current CT   *  Thrombocytopenia.    PLAN   *  Agree with clear liquids.    *  Flex sig, ? Timing.   *  q 12 hour CBC.    *  While inpt will add Protonix 40 mg daily. Does not need this at discharge.       Azucena Freed  06/08/2016, 8:23 AM Pager: [phone]

## 2016-06-08 NOTE — Progress Notes (Signed)
Pharmacy Antibiotic Note  Ian Sullivan. is a 83 y.o. male admitted on 06/10/2016 with rectal bleeding and abdominal pain. Repeat CT 06/08/16 showed new right lower lobe consolidation/atelectasis and mild left basilar atelectasis so pharmacy is asked to dose Zosyn for possible HCAP. MRSA PCR was negative.  Plan: Zosyn 3.375g IV over 73min then 3.375g IV Q8H infused over 4hrs. Follow up renal fxn, culture results, and clinical course.   Height: 5' 11.5" (181.6 cm) Weight: 177 lb 0.5 oz (80.3 kg) IBW/kg (Calculated) : 76.45  Temp (24hrs), Avg:98.4 F (36.9 C), Min:98 F (36.7 C), Max:98.8 F (37.1 C)   Recent Labs Lab 06/23/2016 0223 06/27/2016 0406  06/05/2016 0338 06/14/2016 0704 06/06/2016 1003 06/08/2016 1535 06/08/16 0356 06/08/16 1548  WBC 8.3 9.2  < > 13.6* 11.8* 12.3* 12.2* 9.0 8.3  CREATININE 1.14 0.96  --  0.91  --   --   --  0.90  --   < > = values in this interval not displayed.  Estimated Creatinine Clearance: 63.8 mL/min (by C-G formula based on SCr of 0.9 mg/dL).    Allergies  Allergen Reactions  . Horse-Derived Products     Antimicrobials this admission: Zosyn 3/11 >>  Dose adjustments this admission:  Microbiology results: 3/9 MRSA PCR: negative  Thank you for allowing pharmacy to be a part of this patient's care.  Romeo Rabon, PharmD, pager [phone]. 06/08/2016,7:39 PM.

## 2016-06-08 NOTE — Progress Notes (Signed)
  Subjective: Still complains of pelvic pain. Passing flatus and had small bm  Objective: Vital signs in last 24 hours: Temp:  [97.9 F (36.6 C)-99.2 F (37.3 C)] 98 F (36.7 C) (03/10 2307) Pulse Rate:  [64-82] 79 (03/11 0500) Resp:  [19-27] 24 (03/11 0500) BP: (145-165)/(48-77) 147/50 (03/11 0500) SpO2:  [86 %-94 %] 92 % (03/11 0500) Last BM Date: 06/26/2016  Intake/Output from previous day: 03/10 0701 - 03/11 0700 In: 143.8 [I.V.:93.8; IV Piggyback:50] Out: 6195 [Urine:1820] Intake/Output this shift: Total I/O In: -  Out: 280 [Urine:280]  Resp: clear to auscultation bilaterally Cardio: regular rate and rhythm GI: soft with some distension. moderate tenderness over lower abdomen  Lab Results:   Recent Labs  06/15/2016 1535 06/08/16 0356  WBC 12.2* 9.0  HGB 9.0* 8.5*  HCT 26.3* 24.6*  PLT 140* 129*   BMET  Recent Labs  05/29/2016 0338 06/08/16 0356  NA 140 138  K 3.4* 3.2*  CL 109 104  CO2 24 27  GLUCOSE 145* 131*  BUN 24* 26*  CREATININE 0.91 0.90  CALCIUM 8.5* 8.5*   PT/INR  Recent Labs  06/28/2016 0338 06/08/16 0356  LABPROT 18.0* 16.9*  INR 1.47 1.36   ABG No results for input(s): PHART, HCO3 in the last 72 hours.  Invalid input(s): PCO2, PO2  Studies/Results: Dg Chest Port 1 View  Result Date: 06/03/2016 CLINICAL DATA:  SOB today EXAM: PORTABLE CHEST - 1 VIEW COMPARISON:  09/03/2015 FINDINGS: Low lung volumes with Coarse interstitial markings in the lung bases right greater than left, slightly more prominent than on previous exam. Coarse perihilar bronchovascular markings. Stable cardiomegaly.  Atheromatous aorta. No effusion.  No pneumothorax. Visualized bones unremarkable. Surgical clips, left upper abdomen. Dorsal stimulator catheters project over the lower thoracic spine as before. IMPRESSION: 1. Low volumes with some increase in bibasilar atelectasis. 2. Stable cardiomegaly and postop changes. Electronically Signed   By: Lucrezia Europe M.D.    On: 06/15/2016 09:43   Dg Abd Portable 1v  Result Date: 06/13/2016 CLINICAL DATA:  Abdominal distension EXAM: PORTABLE ABDOMEN - 1 VIEW COMPARISON:  06/01/2016 FINDINGS: Scattered large and small bowel gas is noted. The stomach is distended with air is well. Fecal material is noted throughout the colon similar to that seen on recent CT examination. Postsurgical changes in the lower lumbar spine are noted. IMPRESSION: Gaseous distension of the stomach. Air in the colon similar to recent CT. Electronically Signed   By: Inez Catalina M.D.   On: 06/19/2016 11:40    Anti-infectives: Anti-infectives    None      Assessment/Plan: s/p Procedure(s): FLEXIBLE SIGMOIDOSCOPY (N/A) Advance diet. Allow clears Hg stable No plan for urgent surgery at this point. Continue to hold blood thinners  LOS: 2 days    TOTH III,PAUL S 06/08/2016

## 2016-06-08 NOTE — Progress Notes (Signed)
PROGRESS NOTE [facility]   Judie Bonus.   KWI:[id number] DOB: October 23, 1927  DOA: 06/21/2016 PCP: [name], MD   Brief Narrative:  83 year old male with medical history significant for A. 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 CT of the abdomen was performed showing intraluminal and extraluminal breathing with hematoma around the sigmoid colon. Patient admitted for surgical and gastrointestinal evaluation and to monitor hemoglobin. Warfarin was reversed with vitamin K and fresh frozen plasma. Patient was transfused 1 unit of PRBCs and hemoglobin looks that is leveling out. Continue conservative management for the time being.  Subjective: Patient doing better, continues to c/o abdominal pain, belly with same distention, passing gas and had BM non bloody.   Assessment & Plan: Intra-and ?extra luminal colonic GI bleed with peritoneal bleed - unclear etiology, patient on warfarin. INR therapeutic, patient have recent fall, but no significant abdominal trauma - Bleeding seem to stop  History of volvulus, no signs of perforation case discussed with radiology. INR subtherapeutic - continue to hold A/C  Surgery recommendations appreciated - continue to monitor GI recommendations appreciated - possible endoscopy vs flex sig - 1 or 2 day  Case discussed with IR - Think that is more retroperitoneal bleed rather than intraperitoneal. No intervention at this point - will continue to monitor  Continue Pepcid Monitor CBC q 12  Advance diet to clears  Transfuse if Hgb < 8 or symptomatic anemia at any point Will keep in step down for now.  Chronic diastolic HF - some signs of fluid overload, patient received aggressive fluid resuscitation   CXR shows stable cardiomegaly and atelectasis  Resume home dose lasix    Wean O2 as tolerated  ECHO - Last one on 2016 showed EF 24-26% with LV diastolic dysfunction. - New ECHO no changes on EF unable to evaluated diastolic function.  Continue Incentive spirometry   Afib - CHADVASCS 4 - On coumadin at home, HR stable  Continue to hold A/c for now due to active bleeding - patient eventually need a/c given high risk will discuss with cardiology when GI bleed has been stabilized Continue BB for now  Continue tele   HTN - BP stable  Continue home medication   Depression  Continue home medications  Chronic back pain  Stable   DVT prophylaxis: SCD's Code Status: FULL  Family Communication: Wife at bedside Disposition Plan: Home when medically stable   Consultants:   GI - [place]  Gen Surgery   Procedures:   None   Antimicrobials:  None    Objective: Vitals:   06/08/16 0400 06/08/16 0500 06/08/16 0741 06/08/16 0800  BP: (!) 145/48 (!) 147/50  (!) 154/70  Pulse: 82 79 73 75  Resp: (!) 27 (!) 24 (!) 24 (!) 25  Temp:   98.7 F (37.1 C)   TempSrc:   Oral   SpO2: (!) 86% 92% 96% 96%  Weight:      Height:        Intake/Output Summary (Last 24 hours) at 06/08/16 0832 Last data filed at 06/08/16 0600  Gross per 24 hour  Intake           143.75 ml  Output             1820 ml  Net         -1676.25 ml   [employer]  06/23/2016 0235 06/23/2016 0536  Weight: 81.6 kg (180 lb) 80.3 kg (177 lb 0.5 oz)    Examination:  General exam: Calm and comfortable   Respiratory system: Good air entry b/l crackles  Cardiovascular system: Irr Irr no rmg Gastrointestinal system: Abd soft, distended, diffuse severe tenderness  Central nervous system: AAOx3. Extremities: 1+ LE edema  Skin: LE discoloration chronic changes  Psychiatry: Mood appropriate    Data Reviewed: I have personally reviewed following labs and imaging studies  CBC:  Recent Labs Lab 06/05/2016 0223  05/29/2016 0338 06/22/2016 0704 06/27/2016 1003 06/21/2016 1535 06/08/16 0356   WBC 8.3  < > 13.6* 11.8* 12.3* 12.2* 9.0  NEUTROABS 6.7  --   --   --   --   --   --   HGB 11.7*  < > 9.3* 8.7* 8.8* 9.0* 8.5*  HCT 34.7*  < > 27.0* 26.2* 25.7* 26.3* 24.6*  MCV 94.6  < > 94.7 93.6 94.5 94.6 94.6  PLT 181  < > 160 141* 127* 140* 129*  < > = values in this interval not displayed. Basic Metabolic Panel:  Recent Labs Lab 06/12/2016 0223 06/18/2016 0406 06/19/2016 0338 06/28/2016 0702 06/08/16 0356  NA 140 141 140  --  138  K 3.9 4.3 3.4*  --  3.2*  CL 105 107 109  --  104  CO2 25 27 24   --  27  GLUCOSE 131* 110* 145*  --  131*  BUN 24* 26* 24*  --  26*  CREATININE 1.14 0.96 0.91  --  0.90  CALCIUM 9.3 9.4 8.5*  --  8.5*  MG  --   --   --  1.7 1.8   GFR: Estimated Creatinine Clearance: 63.8 mL/min (by C-G formula based on SCr of 0.9 mg/dL). Liver Function Tests: No results for input(s): AST, ALT, ALKPHOS, BILITOT, PROT, ALBUMIN in the last 168 hours.  Recent Labs Lab 06/09/2016 0406  LIPASE 28   No results for input(s): AMMONIA in the last 168 hours. Coagulation Profile:  Recent Labs Lab 06/18/2016 0223 06/21/2016 0406 05/30/2016 1415 06/03/2016 0338 06/08/16 0356  INR 2.72 2.68 1.57 1.47 1.36   Cardiac Enzymes: No results for input(s): CKTOTAL, CKMB, CKMBINDEX, TROPONINI in the last 168 hours. BNP (last 3 results) No results for input(s): PROBNP in the last 8760 hours. HbA1C: No results for input(s): HGBA1C in the last 72 hours. CBG:  Recent Labs Lab 06/02/2016 0750 06/14/2016 1921 06/08/16 0730  GLUCAP 123* 115* 124*   Lipid Profile: No results for input(s): CHOL, HDL, LDLCALC, TRIG, CHOLHDL, LDLDIRECT in the last 72 hours. Thyroid Function Tests: No results for input(s): TSH, T4TOTAL, FREET4, T3FREE, THYROIDAB in the last 72 hours. Anemia Panel: No results for input(s): VITAMINB12, FOLATE, FERRITIN, TIBC, IRON, RETICCTPCT in the last 72 hours. Sepsis Labs: No results for input(s): PROCALCITON, LATICACIDVEN in the last 168 hours.  Recent Results  (from the past 240 hour(s))  MRSA PCR Screening     Status: None   Collection Time: 06/03/2016 12:20 PM  Result Value Ref Range Status   MRSA by PCR NEGATIVE NEGATIVE Final    Comment:        The GeneXpert MRSA Assay (FDA approved for NASAL specimens only), is one component of a comprehensive MRSA colonization surveillance program. It is not intended to diagnose MRSA infection nor to guide or monitor treatment for MRSA infections.      Radiology Studies: Dg Chest Port 1 View  Result Date: 06/19/2016 CLINICAL DATA:  SOB today EXAM: PORTABLE CHEST - 1 VIEW COMPARISON:  09/03/2015 FINDINGS: Low lung volumes with Coarse interstitial markings in the lung bases right greater than left, slightly more prominent than on previous exam. Coarse perihilar bronchovascular markings. Stable cardiomegaly.  Atheromatous aorta. No effusion.  No pneumothorax. Visualized bones unremarkable. Surgical clips, left upper abdomen. Dorsal stimulator catheters project over the lower thoracic spine as before. IMPRESSION: 1. Low volumes with some increase in bibasilar atelectasis. 2. Stable cardiomegaly and postop changes. Electronically Signed   By: Lucrezia Europe M.D.   On: 06/17/2016 09:43   Dg Abd Portable 1v  Result Date: 06/10/2016 CLINICAL DATA:  Abdominal distension EXAM: PORTABLE ABDOMEN - 1 VIEW COMPARISON:  06/05/2016 FINDINGS: Scattered large and small bowel gas is noted. The stomach is distended with air is well. Fecal material is noted throughout the colon similar to that seen on recent CT examination. Postsurgical changes in the lower lumbar spine are noted. IMPRESSION: Gaseous distension of the stomach. Air in the colon similar to recent CT. Electronically Signed   By: Inez Catalina M.D.   On: 06/10/2016 11:40    Scheduled Meds: . amLODipine  10 mg Oral Daily  . calcium carbonate  1,250 mg Oral Daily  . cholecalciferol  1,000 Units Oral Daily  . famotidine (PEPCID) IV  20 mg Intravenous Q24H  . ferrous  sulfate  325 mg Oral Daily  . FLUoxetine  20 mg Oral Daily  . magnesium oxide  200 mg Oral Daily  . multivitamin with minerals  1 tablet Oral Daily  . omega-3 acid ethyl esters  1 capsule Oral BID  . 0.9 % sodium chloride with kcl   Intravenous Once  . sodium chloride flush  3 mL Intravenous Q12H  . vitamin E  400 Units Oral Daily   Continuous Infusions:    LOS: 2 days    Chipper Oman, MD Pager: Text Page via [email or web address]  [phone]  If 7PM-7AM, please contact night-coverage [email or web address] Password [facility] 06/08/2016, 8:32 AM

## 2016-06-08 NOTE — Progress Notes (Addendum)
[facility]  Interval note  Called by RN because at around 5:00 PM patient was having new severe left abdominal pain and increase in abdominal distension. Patient has been previously complaining of lower abdominal pain but not at the left side. Patient also c/o of difficulty breathing. CT abdomen/pelvis repeated  Exam:  VS remains stable Resp: Good air entry, rales at bilateral bases  CV Irr Irr Abdomen: Soft, more distended, Severe pain to palpation LLQ, + guarding, + rebound   CT abdomen:  IMPRESSION: Decreasing size of left pelvic hematoma, but now there are apparent foci of extraluminal gas within the hematoma which suggests a connection with the adjacent colon. No evidence of focal abscess or gross pneumoperitoneum at this time.  New gaseous distension of portions of the transverse and sigmoid colon and stomach. No dilated small bowel loops identified.  New right lower lobe consolidation/atelectasis and mild left basilar atelectasis.  New trace bilateral pleural effusions, and mild subcutaneous and mesenteric edema.  Abdominal aortic atherosclerosis.  Impression Colon hematoma with free air within the hematoma Mesenteric edema New right lower lobe consolidation - PNA (aspiration vs HCAP) - consistent with clinical findings Atelectasis   Plan  Case discussed with radiology - no signs of free air intraperitoneal, no perforation. At this point does not look like an abscess  Will start Zosyn - will cover PNA and intraabdominal pathology  Place patient NPO  Encourage use of incentive spirometer Case discussed with Surgical team - they will follow. It may need to be drain or evacuated Check CBC in AM  Pain control as needed.   Time spent: 35 minutes   Chipper Oman, MD

## 2016-06-08 NOTE — Progress Notes (Signed)
Patient complaining of left side abdominal pain. Patient states this pain is new and the worst pain he has experienced since being in the hospital.  Patient states pain came on suddenly. Patient rating pain 10/10. Paged Dr. Quincy Simmonds.

## 2016-06-08 NOTE — Progress Notes (Signed)
Patient had a bloody red/black medium sized stool.

## 2016-06-09 DIAGNOSIS — S36529A Contusion of unspecified part of colon, initial encounter: Secondary | ICD-10-CM

## 2016-06-09 DIAGNOSIS — J189 Pneumonia, unspecified organism: Secondary | ICD-10-CM

## 2016-06-09 DIAGNOSIS — S36529D Contusion of unspecified part of colon, subsequent encounter: Secondary | ICD-10-CM

## 2016-06-09 DIAGNOSIS — K921 Melena: Secondary | ICD-10-CM

## 2016-06-09 LAB — BASIC METABOLIC PANEL
Anion gap: 8 (ref 5–15)
BUN: 23 mg/dL — ABNORMAL HIGH (ref 6–20)
CHLORIDE: 109 mmol/L (ref 101–111)
CO2: 26 mmol/L (ref 22–32)
CREATININE: 0.71 mg/dL (ref 0.61–1.24)
Calcium: 9.1 mg/dL (ref 8.9–10.3)
GFR calc non Af Amer: >60 mL/min (ref >60)
Glucose, Bld: 154 mg/dL — ABNORMAL HIGH (ref 65–99)
POTASSIUM: 3.9 mmol/L (ref 3.5–5.1)
Sodium: 143 mmol/L (ref 135–145)

## 2016-06-09 LAB — CBC
HEMATOCRIT: 27.6 % — AB (ref 39.0–52.0)
HEMATOCRIT: 30.9 % — AB (ref 39.0–52.0)
HEMOGLOBIN: 10.5 g/dL — AB (ref 13.0–17.0)
Hemoglobin: 9.1 g/dL — ABNORMAL LOW (ref 13.0–17.0)
MCH: 31.3 pg (ref 26.0–34.0)
MCH: 31.8 pg (ref 26.0–34.0)
MCHC: 33 g/dL (ref 30.0–36.0)
MCHC: 34 g/dL (ref 30.0–36.0)
MCV: 94.8 fL (ref 78.0–100.0)
MCV: 93.6 fL (ref 78.0–100.0)
PLATELETS: 195 10*3/uL (ref 150–400)
Platelets: 159 10*3/uL (ref 150–400)
RBC: 2.91 MIL/uL — AB (ref 4.22–5.81)
RBC: 3.3 MIL/uL — AB (ref 4.22–5.81)
RDW: 14.9 % (ref 11.5–15.5)
RDW: 14.6 % (ref 11.5–15.5)
WBC: 6.8 10*3/uL (ref 4.0–10.5)
WBC: 7.4 10*3/uL (ref 4.0–10.5)

## 2016-06-09 LAB — BPAM FFP
Blood Product Expiration Date: 201803142359
Blood Product Expiration Date: 201803142359
ISSUE DATE / TIME: 201803090918
ISSUE DATE / TIME: 201803091232
UNIT TYPE AND RH: 8400
Unit Type and Rh: 8400

## 2016-06-09 LAB — PREPARE FRESH FROZEN PLASMA
UNIT DIVISION: 0
UNIT DIVISION: 0

## 2016-06-09 LAB — PROTIME-INR
INR: 1.31
Prothrombin Time: 16.4 seconds — ABNORMAL HIGH (ref 11.4–15.2)

## 2016-06-09 LAB — GLUCOSE, CAPILLARY: Glucose-Capillary: 117 mg/dL — ABNORMAL HIGH (ref 65–99)

## 2016-06-09 LAB — PROCALCITONIN: Procalcitonin: 0.53 ng/mL

## 2016-06-09 MED ORDER — HYDROCODONE-HOMATROPINE 5-1.5 MG/5ML PO SYRP
5.0000 mL | ORAL_SOLUTION | ORAL | Status: DC | PRN
  Administered 2016-06-09 (×2): 5 mL via ORAL
  Filled 2016-06-09 (×2): qty 5

## 2016-06-09 MED ORDER — IPRATROPIUM-ALBUTEROL 0.5-2.5 (3) MG/3ML IN SOLN
3.0000 mL | Freq: Four times a day (QID) | RESPIRATORY_TRACT | Status: DC
  Administered 2016-06-09: 3 mL via RESPIRATORY_TRACT
  Filled 2016-06-09 (×2): qty 3

## 2016-06-09 MED ORDER — IPRATROPIUM BROMIDE 0.02 % IN SOLN
0.5000 mg | Freq: Three times a day (TID) | RESPIRATORY_TRACT | Status: DC
  Administered 2016-06-10 (×3): 0.5 mg via RESPIRATORY_TRACT
  Filled 2016-06-09 (×3): qty 2.5

## 2016-06-09 MED ORDER — FENTANYL CITRATE (PF) 100 MCG/2ML IJ SOLN
50.0000 ug | INTRAMUSCULAR | Status: DC | PRN
  Administered 2016-06-09 (×2): 50 ug via INTRAVENOUS
  Filled 2016-06-09 (×2): qty 2

## 2016-06-09 MED ORDER — METOPROLOL TARTRATE 5 MG/5ML IV SOLN
2.5000 mg | Freq: Three times a day (TID) | INTRAVENOUS | Status: DC
  Administered 2016-06-09 – 2016-06-10 (×3): 2.5 mg via INTRAVENOUS
  Filled 2016-06-09 (×3): qty 5

## 2016-06-09 MED ORDER — LEVALBUTEROL HCL 0.63 MG/3ML IN NEBU
0.6300 mg | INHALATION_SOLUTION | Freq: Three times a day (TID) | RESPIRATORY_TRACT | Status: DC
  Administered 2016-06-10 (×3): 0.63 mg via RESPIRATORY_TRACT
  Filled 2016-06-09 (×3): qty 3

## 2016-06-09 NOTE — Progress Notes (Signed)
PROGRESS NOTE [facility]   Judie Bonus.   EUM:[id number] DOB: 06/09/1927  DOA: 06/20/2016 PCP: [name], MD   Brief Narrative:  83 year old male with medical history significant for A. 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 CT of the abdomen was performed showing intraluminal and extraluminal breathing with hematoma around the sigmoid colon. Patient admitted for surgical and gastrointestinal evaluation and to monitor hemoglobin. Warfarin was reversed with vitamin K and fresh frozen plasma. Patient was transfused 1 unit of PRBCs and hemoglobin looks that is leveling out. Continue conservative management for the time being.  Subjective: Patient seen and examined with wife at bedside. Continues to c/o LLQ pain about same as yesterday. Had a bowel movement early this AM with some blood. hemoglobing stable   Assessment & Plan: Lower GI bleed with colon extraluminal hematoma and peritoneal bleed - unclear etiology, patient on warfarin. INR therapeutic, patient have recent fall, but no significant abdominal trauma - Bleeding seem to have stop ? If bleeding from the anastomosis site (hx of Volvulus). INR subtherapeutic - continue to hold A/C  Repeated CT June 26, 2016 decease in size of left pelvic hematoma, now with extraluminal gas in within the hematoma. No signs of abscess.  Surgery recommendations appreciated - continue to monitor repeat CT 2-3 days, If surgery needed he may end up needing an colostomy which patient would like to avoid  GI recommendations appreciated - possible endoscopy vs flex sig - 1 or 2 days Case discussed with IR - Think that is more retroperitoneal bleed rather than intraperitoneal. No intervention at this point - will continue to monitor  Continue Pepcid Monitor CBC q 12  Advance  diet to clears  Transfuse if Hgb < 8 or symptomatic anemia at any point Will keep patient in step down as high risk for rapid decompensation   HCAP  CT abd/pelvis shows new lower lobe consolidation, consisted with clinical findings of SOB, cough and rales on exam.  Patient was started on Zosyn will continue for now  Get procalcitonin  If spike fever will obtain blood cultures  Incentive spirometry  Duonebs  Hycodan PRN cough  Wean O2 as tolerated    Chronic diastolic HF - some signs of fluid overload, patient received aggressive fluid resuscitation   CXR shows stable cardiomegaly and atelectasis  Continue lasix  ECHO - Last one on 2016 showed EF 54-00% with LV diastolic dysfunction. - New ECHO no changes on EF unable to evaluated diastolic function.    Afib - CHADVASCS 4 - On coumadin at home, HR stable  Continue to hold A/c for now due to active bleeding - patient eventually need a/c given high risk will discuss with cardiology when GI bleed has been stabilized Continue tele   HTN - BP has been elevated, likely related to pain  Continue Norvasc  Will add Metoprolol IN given patient has been back and forth from NPO status Continue IV hydralazine for SBP above 170 Pain control  Monitor BP closely   Depression  Continue home medications  Chronic back pain  Stable   DVT prophylaxis: SCD's Code Status: FULL  Family Communication: Wife at bedside Disposition Plan: Remain in step down give high risk for rapid decompensation   Consultants:   GI - [place]  Gen Surgery   Procedures:   None   Antimicrobials:  None  Objective: Vitals:   06/09/16 0400 06/09/16 0500 06/09/16 0600 06/09/16 0700  BP: (!) 172/69 (!) 167/66 (!) 163/66 (!) 130/91  Pulse: 88 89 94 (!) 106  Resp: (!) 26 (!) 23 (!) 30 (!) 36  Temp:      TempSrc:      SpO2: 94% 95% 95% 92%  Weight:      Height:        Intake/Output Summary (Last 24 hours) at 06/09/16 0853 Last data filed at 06/09/16  8101  Gross per 24 hour  Intake          1183.33 ml  Output              780 ml  Net           403.33 ml   [employer]   06/04/2016 0235 06/10/2016 0536  Weight: 81.6 kg (180 lb) 80.3 kg (177 lb 0.5 oz)    Examination:  General exam: Mild distress due to pain    Respiratory system: Breath sound diminished b/l at the bases, bibasilar rales. No wheezing  Cardiovascular system: S1S2 Irr Irr  Gastrointestinal system: Abdomen soft, severe distention, diffuse tenderness   Central nervous system: Drowsy  Extremities: Trace LE edema b/l  Skin: LE discoloration chronic changes  Psychiatry: Mood appropriate    Data Reviewed: I have personally reviewed following labs and imaging studies  CBC:  Recent Labs Lab 06/08/2016 0223  06/10/2016 1003 06/09/2016 1535 06/08/16 0356 06/08/16 1548 06/09/16 0405  WBC 8.3  < > 12.3* 12.2* 9.0 8.3 6.8  NEUTROABS 6.7  --   --   --   --   --   --   HGB 11.7*  < > 8.8* 9.0* 8.5* 9.4* 9.1*  HCT 34.7*  < > 25.7* 26.3* 24.6* 27.9* 27.6*  MCV 94.6  < > 94.5 94.6 94.6 95.5 94.8  PLT 181  < > 127* 140* 129* 149* 159  < > = values in this interval not displayed. Basic Metabolic Panel:  Recent Labs Lab 06/26/2016 0223 06/25/2016 0406 06/09/2016 0338 06/06/2016 0702 06/08/16 0356 06/09/16 0405  NA 140 141 140  --  138 143  K 3.9 4.3 3.4*  --  3.2* 3.9  CL 105 107 109  --  104 109  CO2 25 27 24   --  27 26  GLUCOSE 131* 110* 145*  --  131* 154*  BUN 24* 26* 24*  --  26* 23*  CREATININE 1.14 0.96 0.91  --  0.90 0.71  CALCIUM 9.3 9.4 8.5*  --  8.5* 9.1  MG  --   --   --  1.7 1.8  --    GFR: Estimated Creatinine Clearance: 71.7 mL/min (by C-G formula based on SCr of 0.71 mg/dL). Liver Function Tests: No results for input(s): AST, ALT, ALKPHOS, BILITOT, PROT, ALBUMIN in the last 168 hours.  Recent Labs Lab 06/12/2016 0406  LIPASE 28   No results for input(s): AMMONIA in the last 168 hours. Coagulation Profile:  Recent Labs Lab 06/13/2016 0406  06/03/2016 1415 06/18/2016 0338 06/08/16 0356 06/09/16 0405  INR 2.68 1.57 1.47 1.36 1.31   Cardiac Enzymes: No results for input(s): CKTOTAL, CKMB, CKMBINDEX, TROPONINI in the last 168 hours. BNP (last 3 results) No results for input(s): PROBNP in the last 8760 hours. HbA1C: No results for input(s): HGBA1C in the last 72 hours. CBG:  Recent Labs Lab 05/31/2016 0750 06/17/2016 1921 06/08/16 0730  GLUCAP 123* 115* 124*   Lipid Profile: No  results for input(s): CHOL, HDL, LDLCALC, TRIG, CHOLHDL, LDLDIRECT in the last 72 hours. Thyroid Function Tests: No results for input(s): TSH, T4TOTAL, FREET4, T3FREE, THYROIDAB in the last 72 hours. Anemia Panel: No results for input(s): VITAMINB12, FOLATE, FERRITIN, TIBC, IRON, RETICCTPCT in the last 72 hours. Sepsis Labs: No results for input(s): PROCALCITON, LATICACIDVEN in the last 168 hours.  Recent Results (from the past 240 hour(s))  MRSA PCR Screening     Status: None   Collection Time: 05/31/2016 12:20 PM  Result Value Ref Range Status   MRSA by PCR NEGATIVE NEGATIVE Final    Comment:        The GeneXpert MRSA Assay (FDA approved for NASAL specimens only), is one component of a comprehensive MRSA colonization surveillance program. It is not intended to diagnose MRSA infection nor to guide or monitor treatment for MRSA infections.      Radiology Studies: Ct Abdomen Pelvis Wo Contrast  Result Date: 06/08/2016 CLINICAL DATA:  83 year old male with increasing left abdominal and pelvic pain and hypoxia. Follow-up hematoma adjacent to the sigmoid colon. EXAM: CT ABDOMEN AND PELVIS WITHOUT CONTRAST TECHNIQUE: Multidetector CT imaging of the abdomen and pelvis was performed following the standard protocol without IV contrast. COMPARISON:  06/15/2016 CT FINDINGS: Please note that parenchymal abnormalities may be missed without intravenous contrast. Lower chest: New right lower lobe consolidation/atelectasis and left basilar atelectasis  noted. Trace bilateral pleural effusions are present. Moderate to marked cardiomegaly identified. Hepatobiliary: No acute acute hepatic or significant gallbladder abnormalities. No evidence of biliary dilatation. Pancreas: Unremarkable Spleen: Unremarkable Adrenals/Urinary Tract: No acute renal, adrenal or bladder abnormalities. Stomach/Bowel: Hematoma within the lower left pelvis has slightly decreased in size, now measuring 6.7 x 9.9 cm in greatest diameter, previously 8.4 x 12 cm at the same level. Apparent small foci of extraluminal gas within the hematoma are noted and connection to the adjacent colon may be present. Gaseous distension of portions of the transverse and sigmoid colon noted. Mild gastric distention is present. No dilated small bowel loops are present. There is no evidence of pneumoperitoneum or new areas of hemorrhage. Vascular/Lymphatic: Aortic atherosclerotic calcifications noted without aneurysm. No enlarged lymph nodes. Reproductive: Prostate is unremarkable. Other: Mild mesenteric, subcutaneous and presacral edema noted. No new focal collections/ abscess identified. Musculoskeletal: No acute abnormality. Lumbar spine surgical changes and degenerative changes again noted. IMPRESSION: Decreasing size of left pelvic hematoma, but now there are apparent foci of extraluminal gas within the hematoma which suggests a connection with the adjacent colon. No evidence of focal abscess or gross pneumoperitoneum at this time. New gaseous distension of portions of the transverse and sigmoid colon and stomach. No dilated small bowel loops identified. New right lower lobe consolidation/atelectasis and mild left basilar atelectasis. New trace bilateral pleural effusions, and mild subcutaneous and mesenteric edema. Abdominal aortic atherosclerosis. Electronically Signed   By: Margarette Canada M.D.   On: 06/08/2016 18:21   Dg Chest Port 1 View  Result Date: 06/08/2016 CLINICAL DATA:  SOB today EXAM: PORTABLE  CHEST - 1 VIEW COMPARISON:  09/03/2015 FINDINGS: Low lung volumes with Coarse interstitial markings in the lung bases right greater than left, slightly more prominent than on previous exam. Coarse perihilar bronchovascular markings. Stable cardiomegaly.  Atheromatous aorta. No effusion.  No pneumothorax. Visualized bones unremarkable. Surgical clips, left upper abdomen. Dorsal stimulator catheters project over the lower thoracic spine as before. IMPRESSION: 1. Low volumes with some increase in bibasilar atelectasis. 2. Stable cardiomegaly and postop changes. Electronically Signed  By: Lucrezia Europe M.D.   On: 06/26/2016 09:43   Dg Abd Portable 1v  Result Date: 06/10/2016 CLINICAL DATA:  Abdominal distension EXAM: PORTABLE ABDOMEN - 1 VIEW COMPARISON:  06/16/2016 FINDINGS: Scattered large and small bowel gas is noted. The stomach is distended with air is well. Fecal material is noted throughout the colon similar to that seen on recent CT examination. Postsurgical changes in the lower lumbar spine are noted. IMPRESSION: Gaseous distension of the stomach. Air in the colon similar to recent CT. Electronically Signed   By: Inez Catalina M.D.   On: 06/28/2016 11:40    Scheduled Meds: . amLODipine  10 mg Oral Daily  . calcium carbonate  1,250 mg Oral Daily  . cholecalciferol  1,000 Units Oral Daily  . ferrous sulfate  325 mg Oral Daily  . FLUoxetine  20 mg Oral Daily  . furosemide  40 mg Oral Daily  . magnesium oxide  200 mg Oral Daily  . multivitamin with minerals  1 tablet Oral Daily  . omega-3 acid ethyl esters  1 capsule Oral BID  . pantoprazole  40 mg Oral Q0600  . piperacillin-tazobactam (ZOSYN)  IV  3.375 g Intravenous Q8H  . sodium chloride flush  3 mL Intravenous Q12H  . vitamin E  400 Units Oral Daily   Continuous Infusions:    LOS: 3 days    Chipper Oman, MD Pager: Text Page via [email or web address]  [phone]  If 7PM-7AM, please contact night-coverage [email or web address] Password  [facility] 06/09/2016, 8:53 AM

## 2016-06-09 NOTE — Progress Notes (Signed)
General Surgery [facility]  Assessment & Plan:  HD#4 - mesenteric hematoma/abscess at site of previous sigmoid colectomy (08/2015)  WBC normal, afebrile; Hgb rising  CTA from yesterday reviewed - small extraluminal gas, no abscess  Continue to hold anticoagulation  Clear liquid diet as tolerated  Will follow with you.  Trying to avoid operative intervention - would require resection with colostomy.  Patient and family would like to avoid.  Continue to monitor - may need to repeat CT abd/pelvis in 2-3 days.          Earnstine Regal, MD, [facility]       Office: [phone]    Subjective: Patient in bed, wife at bedside.  Complains of LLQ pain.  Has seen some blood in BM's.  Objective: Vital signs in last 24 hours: Temp:  [98.2 F (36.8 C)-98.8 F (37.1 C)] 98.2 F (36.8 C) (03/11 2305) Pulse Rate:  [66-106] 106 (03/12 0700) Resp:  [19-36] 36 (03/12 0700) BP: (130-182)/(62-91) 130/91 (03/12 0700) SpO2:  [81 %-97 %] 92 % (03/12 0700) Last BM Date: 06/08/16  Intake/Output from previous day: 03/11 0701 - 03/12 0700 In: 1183.3 [I.V.:1083.3; IV Piggyback:100] Out: 780 [Urine:780] Intake/Output this shift: No intake/output data recorded.  Physical Exam: HEENT - sclerae clear, mucous membranes moist Neck - soft Abdomen - protuberant, soft; moderate tenderness LLQ, mild diffuse tenderness; well healed midline wound Ext - no edema, non-tender Neuro - alert & oriented, no focal deficits  Lab Results:   Recent Labs  06/08/16 1548 06/09/16 0405  WBC 8.3 6.8  HGB 9.4* 9.1*  HCT 27.9* 27.6*  PLT 149* 159   BMET  Recent Labs  06/08/16 0356 06/09/16 0405  NA 138 143  K 3.2* 3.9  CL 104 109  CO2 27 26  GLUCOSE 131* 154*  BUN 26* 23*  CREATININE 0.90 0.71  CALCIUM 8.5* 9.1   PT/INR  Recent Labs  06/08/16 0356 06/09/16 0405  LABPROT 16.9* 16.4*  INR 1.36 1.31   Comprehensive Metabolic Panel:     Component Value Date/Time   NA 143 06/09/2016 0405   NA 138 06/08/2016 0356   K 3.9 06/09/2016 0405   K 3.2 (L) 06/08/2016 0356   CL 109 06/09/2016 0405   CL 104 06/08/2016 0356   CO2 26 06/09/2016 0405   CO2 27 06/08/2016 0356   BUN 23 (H) 06/09/2016 0405   BUN 26 (H) 06/08/2016 0356   CREATININE 0.71 06/09/2016 0405   CREATININE 0.90 06/08/2016 0356   CREATININE 0.95 04/29/2016 1633   CREATININE 0.83 04/17/2015 1535   GLUCOSE 154 (H) 06/09/2016 0405   GLUCOSE 131 (H) 06/08/2016 0356   CALCIUM 9.1 06/09/2016 0405   CALCIUM 8.5 (L) 06/08/2016 0356   AST 26 09/07/2015 0427   AST 48 (H) 09/03/2015 1415   ALT 21 09/07/2015 0427   ALT 27 09/03/2015 1415   ALKPHOS 74 09/07/2015 0427   ALKPHOS 111 09/03/2015 1415   BILITOT 1.2 09/07/2015 0427   BILITOT 2.2 (H) 09/03/2015 1415   PROT 6.1 (L) 09/07/2015 0427   PROT 8.2 (H) 09/03/2015 1415   ALBUMIN 3.5 09/07/2015 0427   ALBUMIN 4.9 09/03/2015 1415    Studies/Results: Ct Abdomen Pelvis Wo Contrast  Result Date: 06/08/2016 CLINICAL DATA:  83 year old male with increasing left abdominal and pelvic pain and hypoxia. Follow-up hematoma adjacent to the sigmoid colon. EXAM: CT ABDOMEN AND PELVIS WITHOUT CONTRAST TECHNIQUE: Multidetector CT imaging  of the abdomen and pelvis was performed following the standard protocol without IV contrast. COMPARISON:  06/23/2016 CT FINDINGS: Please note that parenchymal abnormalities may be missed without intravenous contrast. Lower chest: New right lower lobe consolidation/atelectasis and left basilar atelectasis noted. Trace bilateral pleural effusions are present. Moderate to marked cardiomegaly identified. Hepatobiliary: No acute acute hepatic or significant gallbladder abnormalities. No evidence of biliary dilatation. Pancreas: Unremarkable Spleen: Unremarkable Adrenals/Urinary Tract: No acute renal, adrenal or bladder abnormalities. Stomach/Bowel: Hematoma within the lower left pelvis has slightly  decreased in size, now measuring 6.7 x 9.9 cm in greatest diameter, previously 8.4 x 12 cm at the same level. Apparent small foci of extraluminal gas within the hematoma are noted and connection to the adjacent colon may be present. Gaseous distension of portions of the transverse and sigmoid colon noted. Mild gastric distention is present. No dilated small bowel loops are present. There is no evidence of pneumoperitoneum or new areas of hemorrhage. Vascular/Lymphatic: Aortic atherosclerotic calcifications noted without aneurysm. No enlarged lymph nodes. Reproductive: Prostate is unremarkable. Other: Mild mesenteric, subcutaneous and presacral edema noted. No new focal collections/ abscess identified. Musculoskeletal: No acute abnormality. Lumbar spine surgical changes and degenerative changes again noted. IMPRESSION: Decreasing size of left pelvic hematoma, but now there are apparent foci of extraluminal gas within the hematoma which suggests a connection with the adjacent colon. No evidence of focal abscess or gross pneumoperitoneum at this time. New gaseous distension of portions of the transverse and sigmoid colon and stomach. No dilated small bowel loops identified. New right lower lobe consolidation/atelectasis and mild left basilar atelectasis. New trace bilateral pleural effusions, and mild subcutaneous and mesenteric edema. Abdominal aortic atherosclerosis. Electronically Signed   By: Margarette Canada M.D.   On: 06/08/2016 18:21   Dg Chest Port 1 View  Result Date: 06/20/2016 CLINICAL DATA:  SOB today EXAM: PORTABLE CHEST - 1 VIEW COMPARISON:  09/03/2015 FINDINGS: Low lung volumes with Coarse interstitial markings in the lung bases right greater than left, slightly more prominent than on previous exam. Coarse perihilar bronchovascular markings. Stable cardiomegaly.  Atheromatous aorta. No effusion.  No pneumothorax. Visualized bones unremarkable. Surgical clips, left upper abdomen. Dorsal stimulator  catheters project over the lower thoracic spine as before. IMPRESSION: 1. Low volumes with some increase in bibasilar atelectasis. 2. Stable cardiomegaly and postop changes. Electronically Signed   By: Lucrezia Europe M.D.   On: 06/05/2016 09:43   Dg Abd Portable 1v  Result Date: 06/23/2016 CLINICAL DATA:  Abdominal distension EXAM: PORTABLE ABDOMEN - 1 VIEW COMPARISON:  06/14/2016 FINDINGS: Scattered large and small bowel gas is noted. The stomach is distended with air is well. Fecal material is noted throughout the colon similar to that seen on recent CT examination. Postsurgical changes in the lower lumbar spine are noted. IMPRESSION: Gaseous distension of the stomach. Air in the colon similar to recent CT. Electronically Signed   By: Inez Catalina M.D.   On: 06/05/2016 11:40      Genelda Roark M 06/09/2016  Patient ID: Ian Bonus., male   DOB: 1927-08-31, 83 y.o.   MRN: [id number]

## 2016-06-09 NOTE — Care Management Note (Signed)
Case Management Note  Patient Details  Name: Ian Sullivan. MRN: [id number] Date of Birth: 06/08/1927  Subjective/Objective:   Pna, left pevic hematoma s/p abd surg [facility].                 Action/Plan:Date:  June 09, 2016 Chart reviewed for concurrent status and case management needs. Will continue to follow patient progress. Discharge Planning: following for needs Expected discharge date: [id number] Velva Harman, BSN, [place], [place]   Expected Discharge Date:                  Expected Discharge Plan:  Home/Self Care  In-House Referral:     Discharge planning Services     Post Acute Care Choice:    Choice offered to:     DME Arranged:    DME Agency:     HH Arranged:    [place] Agency:     Status of Service:  In process, will continue to follow  If discussed at Long Length of Stay Meetings, dates discussed:    Additional Comments:  Leeroy Cha, RN 06/09/2016, 10:09 AM

## 2016-06-09 NOTE — Progress Notes (Signed)
Progress Note   Subjective  Chief Complaint: LLQ pain, Hematochezia  Patient drowsy during time of his review this morning, unable to relay information. Does continue with abdominal pain. Per nursing multiple bowel movements that were loose, no further bleeding.   Objective   Vital signs in last 24 hours: Temp:  [98.2 F (36.8 C)-98.8 F (37.1 C)] 98.3 F (36.8 C) (03/12 0800) Pulse Rate:  [66-113] 113 (03/12 1000) Resp:  [19-36] 31 (03/12 1000) BP: (130-187)/(61-92) 187/71 (03/12 1000) SpO2:  [81 %-97 %] 93 % (03/12 1000) Last BM Date: 06/08/16 General: Caucasian male in NAD Heart:  Regular rate and rhythm; no murmurs Lungs: Respirations even and unlabored, lungs CTA bilaterally Abdomen:  Soft, mod tenderness worse in LLQ, Hypertympanic, decreased bowels sounds all four quad. Normal bowel sounds. Extremities:  Without edema. Psych:  Cooperative.Lethargic  Lab Results:  Recent Labs  06/08/16 0356 06/08/16 1548 06/09/16 0405  WBC 9.0 8.3 6.8  HGB 8.5* 9.4* 9.1*  HCT 24.6* 27.9* 27.6*  PLT 129* 149* 159     Recent Labs  06/08/16 0356 06/09/16 0405  LABPROT 16.9* 16.4*  INR 1.36 1.31    Studies/Results: Ct Abdomen Pelvis Wo Contrast  Result Date: 06/08/2016 CLINICAL DATA:  83 year old male with increasing left abdominal and pelvic pain and hypoxia. Follow-up hematoma adjacent to the sigmoid colon. EXAM: CT ABDOMEN AND PELVIS WITHOUT CONTRAST TECHNIQUE: Multidetector CT imaging of the abdomen and pelvis was performed following the standard protocol without IV contrast. COMPARISON:  06/22/2016 CT FINDINGS: Please note that parenchymal abnormalities may be missed without intravenous contrast. Lower chest: New right lower lobe consolidation/atelectasis and left basilar atelectasis noted. Trace bilateral pleural effusions are present. Moderate to marked cardiomegaly identified. Hepatobiliary: No acute acute hepatic or significant gallbladder abnormalities. No evidence  of biliary dilatation. Pancreas: Unremarkable Spleen: Unremarkable Adrenals/Urinary Tract: No acute renal, adrenal or bladder abnormalities. Stomach/Bowel: Hematoma within the lower left pelvis has slightly decreased in size, now measuring 6.7 x 9.9 cm in greatest diameter, previously 8.4 x 12 cm at the same level. Apparent small foci of extraluminal gas within the hematoma are noted and connection to the adjacent colon may be present. Gaseous distension of portions of the transverse and sigmoid colon noted. Mild gastric distention is present. No dilated small bowel loops are present. There is no evidence of pneumoperitoneum or new areas of hemorrhage. Vascular/Lymphatic: Aortic atherosclerotic calcifications noted without aneurysm. No enlarged lymph nodes. Reproductive: Prostate is unremarkable. Other: Mild mesenteric, subcutaneous and presacral edema noted. No new focal collections/ abscess identified. Musculoskeletal: No acute abnormality. Lumbar spine surgical changes and degenerative changes again noted. IMPRESSION: Decreasing size of left pelvic hematoma, but now there are apparent foci of extraluminal gas within the hematoma which suggests a connection with the adjacent colon. No evidence of focal abscess or gross pneumoperitoneum at this time. New gaseous distension of portions of the transverse and sigmoid colon and stomach. No dilated small bowel loops identified. New right lower lobe consolidation/atelectasis and mild left basilar atelectasis. New trace bilateral pleural effusions, and mild subcutaneous and mesenteric edema. Abdominal aortic atherosclerosis. Electronically Signed   By: Margarette Canada M.D.   On: 06/08/2016 18:21   Dg Abd Portable 1v  Result Date: 06/17/2016 CLINICAL DATA:  Abdominal distension EXAM: PORTABLE ABDOMEN - 1 VIEW COMPARISON:  06/08/2016 FINDINGS: Scattered large and small bowel gas is noted. The stomach is distended with air is well. Fecal material is noted throughout the  colon similar to that seen on recent  CT examination. Postsurgical changes in the lower lumbar spine are noted. IMPRESSION: Gaseous distension of the stomach. Air in the colon similar to recent CT. Electronically Signed   By: Inez Catalina M.D.   On: 06/23/2016 11:40     Assessment / Plan:    Assessment: 1. 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 Bleeding associated peritonitis 3. Chronic Coumadin for A fib 4. ABL anemia s/p PRBCx1 5. ? Cirrhosis 6. Thrombocytopenia  Plan: 1. Continue clear liquids 2.? Flexible sigmoidoscopy. 3. Continue monitoring CBC   LOS: 3 days   Levin Erp  06/09/2016, 10:29 AM  Pager # [phone]    [facility] Attending   I have taken an interval history, reviewed the chart and examined the patient. I agree with the Advanced Practitioner's note, impression and recommendations.    Discussed w/ Dr. Harlow Asa also  Seems like conservative supportive care makes most sense.  Do not think sigmoidoscopy will change anything at this point. ? If f hematoma decompressing into colon and that is where blood from or if there is some other process like ulceration related. Will f/u again tomorrow but at this point no enodoscopy  Gatha Mayer, MD, [facility]

## 2016-06-10 LAB — CBC
HCT: 32.1 % — ABNORMAL LOW (ref 39.0–52.0)
Hemoglobin: 11.3 g/dL — ABNORMAL LOW (ref 13.0–17.0)
MCH: 33.1 pg (ref 26.0–34.0)
MCHC: 35.2 g/dL (ref 30.0–36.0)
MCV: 94.1 fL (ref 78.0–100.0)
Platelets: 216 10*3/uL (ref 150–400)
RBC: 3.41 MIL/uL — AB (ref 4.22–5.81)
RDW: 14.5 % (ref 11.5–15.5)
WBC: 9.4 10*3/uL (ref 4.0–10.5)

## 2016-06-10 LAB — TYPE AND SCREEN
ABO/RH(D): AB POS
Antibody Screen: NEGATIVE
UNIT DIVISION: 0
Unit division: 0

## 2016-06-10 LAB — BPAM RBC
BLOOD PRODUCT EXPIRATION DATE: 201803292359
BLOOD PRODUCT EXPIRATION DATE: 201803292359
ISSUE DATE / TIME: 201803092134
Unit Type and Rh: 6200
Unit Type and Rh: 6200

## 2016-06-10 LAB — BASIC METABOLIC PANEL
Anion gap: 8 (ref 5–15)
BUN: 32 mg/dL — ABNORMAL HIGH (ref 6–20)
CHLORIDE: 109 mmol/L (ref 101–111)
CO2: 27 mmol/L (ref 22–32)
CREATININE: 1.02 mg/dL (ref 0.61–1.24)
Calcium: 9 mg/dL (ref 8.9–10.3)
Glucose, Bld: 126 mg/dL — ABNORMAL HIGH (ref 65–99)
POTASSIUM: 3.1 mmol/L — AB (ref 3.5–5.1)
Sodium: 144 mmol/L (ref 135–145)

## 2016-06-10 LAB — URINALYSIS, ROUTINE W REFLEX MICROSCOPIC
BILIRUBIN URINE: NEGATIVE
Bacteria, UA: NONE SEEN
Glucose, UA: NEGATIVE mg/dL
Ketones, ur: NEGATIVE mg/dL
LEUKOCYTES UA: NEGATIVE
Nitrite: NEGATIVE
PH: 5 (ref 5.0–8.0)
Protein, ur: 100 mg/dL — AB
SPECIFIC GRAVITY, URINE: 1.026 (ref 1.005–1.030)

## 2016-06-10 LAB — PROCALCITONIN: PROCALCITONIN: 5.8 ng/mL

## 2016-06-10 LAB — GLUCOSE, CAPILLARY: GLUCOSE-CAPILLARY: 110 mg/dL — AB (ref 65–99)

## 2016-06-10 LAB — CK: Total CK: 66 U/L (ref 49–397)

## 2016-06-10 MED ORDER — METOPROLOL TARTRATE 5 MG/5ML IV SOLN
5.0000 mg | Freq: Three times a day (TID) | INTRAVENOUS | Status: DC
  Administered 2016-06-10 (×2): 5 mg via INTRAVENOUS
  Filled 2016-06-10 (×2): qty 5

## 2016-06-10 MED ORDER — FENTANYL CITRATE (PF) 100 MCG/2ML IJ SOLN
25.0000 ug | INTRAMUSCULAR | Status: DC | PRN
  Administered 2016-06-10 – 2016-06-11 (×2): 25 ug via INTRAVENOUS
  Filled 2016-06-10 (×2): qty 2

## 2016-06-10 MED ORDER — SODIUM CHLORIDE 0.9 % IV SOLN
INTRAVENOUS | Status: DC
  Administered 2016-06-10: 18:00:00 via INTRAVENOUS

## 2016-06-10 MED ORDER — SODIUM CHLORIDE 0.9 % IV SOLN
Freq: Once | INTRAVENOUS | Status: AC
  Administered 2016-06-10: 09:00:00 via INTRAVENOUS
  Filled 2016-06-10: qty 1000

## 2016-06-10 NOTE — Progress Notes (Signed)
PROGRESS NOTE [facility]   Judie Bonus.   KZL:[id number] DOB: 1928-02-07  DOA: 06/07/2016 PCP: [name], MD   Brief Narrative:  83 year old male with medical history significant for A. 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 CT of the abdomen was performed showing intraluminal and extraluminal breathing with hematoma around the sigmoid colon. Patient admitted for surgical and gastrointestinal evaluation and to monitor hemoglobin. Warfarin was reversed with vitamin K and fresh frozen plasma. Patient was transfused 1 unit of PRBCs and hemoglobin looks that is leveling out. Continue conservative management for the time being.  Subjective: Patient seen and examined with wife at bedside. Pain have significantly improve, patient reports to me that he continues to have bloody BM also this morning having red urine.   Assessment & Plan: Lower GI bleed with colon extraluminal hematoma and peritoneal bleed - unclear etiology, patient was warfarin. INR was therapeutic 2.7, patient had recent fall, but no significant abdominal trauma - Bleeding seem to have stop ? If bleeding from the anastomosis site (hx of Volvulus). INR subtherapeutic - continue to hold A/C  Repeated CT 2016-06-18 decease in size of left pelvic hematoma, now with extraluminal gas in within the hematoma. No signs of abscess.  Surgery recommendations appreciated - continue to monitor, If surgery needed he may end up needing an colostomy which patient would like to avoid - will repeat CT abdomen on Jun 21, 2016 GI recommendations appreciated - initial though of endoscopy vs flex sig - now conservative treatment since patient clinically improving.  Case discussed with IR - Think that is more retroperitoneal bleed rather than intraperitoneal. No  intervention at this point - will continue to monitor  Continue Pepcid Monitor CBC daily  Transfuse if Hgb < 8 or symptomatic anemia at any point Mobilize OOB as tolerated  Continue clear liquids for now  Can transfer to tele floor   HCAP  CT abd/pelvis shows new lower lobe consolidation, consisted with clinical findings of SOB, cough and rales on exam.  Continue Zosyn for now  Pro calcitonin elevated  If spike fever will obtain blood cultures  Incentive spirometry  Duonebs  Hycodan PRN cough  Wean O2 as tolerated    Chronic diastolic HF - initially some signs of fluid overload, after patient received aggressive fluid resuscitation, now seem to be compensated  CXR shows stable cardiomegaly and atelectasis  Continue lasix po ECHO - Last one on 2016 showed EF 39-03% with LV diastolic dysfunction. - New ECHO no changes on EF unable to evaluated diastolic function.   On BB   Afib - CHADVASCS 4 - On coumadin at home, HR remains stable  Continue to hold A/c for now due to active bleeding - patient eventually need a/c given high risk will discuss with cardiology when GI bleed has been stabilized Continue tele   HTN - BP has been elevated, likely related to pain  Continue Norvasc  Continue Metoprolol IV given patient has been back and forth from NPO status Continue IV hydralazine for SBP above 175 Pain control  Monitor BP closely   Depression  Continue home medications  Chronic back pain  Stable   DVT prophylaxis: SCD's Code Status: FULL  Family Communication: Wife at bedside Disposition Plan: Remain in step down give high risk for rapid decompensation   Consultants:   GI Midwife  Gen Surgery  Procedures:   None   Antimicrobials:  None    Objective: Vitals:   06/10/16 0310 06/10/16 0416 06/10/16 0500 06/10/16 0600  BP:  134/66 (!) 133/46 109/60  Pulse:  (!) 109 (!) 111 100  Resp:  (!) 21 (!) 21 (!) 21  Temp: 97.6 F (36.4 C)     TempSrc: Oral       SpO2:  95% 96% 97%  Weight:      Height:        Intake/Output Summary (Last 24 hours) at 06/10/16 0834 Last data filed at 06/10/16 0534  Gross per 24 hour  Intake              153 ml  Output              450 ml  Net             -297 ml   Filed Weights   06/04/2016 0235 06/08/2016 0536  Weight: 81.6 kg (180 lb) 80.3 kg (177 lb 0.5 oz)    Examination:  General exam: NAD   Respiratory system: Good air entry, mild rales at the R lower lobe - improving  Cardiovascular system: Irr Irr, no mrg  Gastrointestinal system: Abd, soft, distention improving, mild tenderness in LLQ   Central nervous system: AAOx3 Extremities: No LE edema  Skin: LE discoloration chronic changes   Data Reviewed: I have personally reviewed following labs and imaging studies  CBC:  Recent Labs Lab 06/27/2016 0223  06/08/16 0356 06/08/16 1548 06/09/16 0405 06/09/16 1602 06/10/16 0319  WBC 8.3  < > 9.0 8.3 6.8 7.4 9.4  NEUTROABS 6.7  --   --   --   --   --   --   HGB 11.7*  < > 8.5* 9.4* 9.1* 10.5* 11.3*  HCT 34.7*  < > 24.6* 27.9* 27.6* 30.9* 32.1*  MCV 94.6  < > 94.6 95.5 94.8 93.6 94.1  PLT 181  < > 129* 149* 159 195 216  < > = values in this interval not displayed. Basic Metabolic Panel:  Recent Labs Lab 05/29/2016 0406 06/15/2016 0338 06/12/2016 0702 06/08/16 0356 06/09/16 0405 06/10/16 0527  NA 141 140  --  138 143 144  K 4.3 3.4*  --  3.2* 3.9 3.1*  CL 107 109  --  104 109 109  CO2 27 24  --  27 26 27   GLUCOSE 110* 145*  --  131* 154* 126*  BUN 26* 24*  --  26* 23* 32*  CREATININE 0.96 0.91  --  0.90 0.71 1.02  CALCIUM 9.4 8.5*  --  8.5* 9.1 9.0  MG  --   --  1.7 1.8  --   --    GFR: Estimated Creatinine Clearance: 56.3 mL/min (by C-G formula based on SCr of 1.02 mg/dL). Liver Function Tests: No results for input(s): AST, ALT, ALKPHOS, BILITOT, PROT, ALBUMIN in the last 168 hours.  Recent Labs Lab 06/26/2016 0406  LIPASE 28   No results for input(s): AMMONIA in the last 168  hours. Coagulation Profile:  Recent Labs Lab 06/10/2016 0406 06/17/2016 1415 06/19/2016 0338 06/08/16 0356 06/09/16 0405  INR 2.68 1.57 1.47 1.36 1.31   Cardiac Enzymes: No results for input(s): CKTOTAL, CKMB, CKMBINDEX, TROPONINI in the last 168 hours. BNP (last 3 results) No results for input(s): PROBNP in the last 8760 hours. HbA1C: No results for input(s): HGBA1C in the last 72 hours. CBG:  Recent Labs Lab 05/31/2016 0750 06/07/16 1921 06/08/16  0730 06/09/16 0913 06/10/16 0738  GLUCAP 123* 115* 124* 117* 110*   Lipid Profile: No results for input(s): CHOL, HDL, LDLCALC, TRIG, CHOLHDL, LDLDIRECT in the last 72 hours. Thyroid Function Tests: No results for input(s): TSH, T4TOTAL, FREET4, T3FREE, THYROIDAB in the last 72 hours. Anemia Panel: No results for input(s): VITAMINB12, FOLATE, FERRITIN, TIBC, IRON, RETICCTPCT in the last 72 hours. Sepsis Labs:  Recent Labs Lab 06/09/16 0405 06/10/16 0527  PROCALCITON 0.53 5.80    Recent Results (from the past 240 hour(s))  MRSA PCR Screening     Status: None   Collection Time: 06/10/2016 12:20 PM  Result Value Ref Range Status   MRSA by PCR NEGATIVE NEGATIVE Final    Comment:        The GeneXpert MRSA Assay (FDA approved for NASAL specimens only), is one component of a comprehensive MRSA colonization surveillance program. It is not intended to diagnose MRSA infection nor to guide or monitor treatment for MRSA infections.      Radiology Studies: Ct Abdomen Pelvis Wo Contrast  Result Date: 06/08/2016 CLINICAL DATA:  83 year old male with increasing left abdominal and pelvic pain and hypoxia. Follow-up hematoma adjacent to the sigmoid colon. EXAM: CT ABDOMEN AND PELVIS WITHOUT CONTRAST TECHNIQUE: Multidetector CT imaging of the abdomen and pelvis was performed following the standard protocol without IV contrast. COMPARISON:  06/24/2016 CT FINDINGS: Please note that parenchymal abnormalities may be missed without  intravenous contrast. Lower chest: New right lower lobe consolidation/atelectasis and left basilar atelectasis noted. Trace bilateral pleural effusions are present. Moderate to marked cardiomegaly identified. Hepatobiliary: No acute acute hepatic or significant gallbladder abnormalities. No evidence of biliary dilatation. Pancreas: Unremarkable Spleen: Unremarkable Adrenals/Urinary Tract: No acute renal, adrenal or bladder abnormalities. Stomach/Bowel: Hematoma within the lower left pelvis has slightly decreased in size, now measuring 6.7 x 9.9 cm in greatest diameter, previously 8.4 x 12 cm at the same level. Apparent small foci of extraluminal gas within the hematoma are noted and connection to the adjacent colon may be present. Gaseous distension of portions of the transverse and sigmoid colon noted. Mild gastric distention is present. No dilated small bowel loops are present. There is no evidence of pneumoperitoneum or new areas of hemorrhage. Vascular/Lymphatic: Aortic atherosclerotic calcifications noted without aneurysm. No enlarged lymph nodes. Reproductive: Prostate is unremarkable. Other: Mild mesenteric, subcutaneous and presacral edema noted. No new focal collections/ abscess identified. Musculoskeletal: No acute abnormality. Lumbar spine surgical changes and degenerative changes again noted. IMPRESSION: Decreasing size of left pelvic hematoma, but now there are apparent foci of extraluminal gas within the hematoma which suggests a connection with the adjacent colon. No evidence of focal abscess or gross pneumoperitoneum at this time. New gaseous distension of portions of the transverse and sigmoid colon and stomach. No dilated small bowel loops identified. New right lower lobe consolidation/atelectasis and mild left basilar atelectasis. New trace bilateral pleural effusions, and mild subcutaneous and mesenteric edema. Abdominal aortic atherosclerosis. Electronically Signed   By: Margarette Canada M.D.   On:  06/08/2016 18:21    Scheduled Meds: . amLODipine  10 mg Oral Daily  . calcium carbonate  1,250 mg Oral Daily  . cholecalciferol  1,000 Units Oral Daily  . ferrous sulfate  325 mg Oral Daily  . FLUoxetine  20 mg Oral Daily  . furosemide  40 mg Oral Daily  . ipratropium  0.5 mg Nebulization TID  . levalbuterol  0.63 mg Nebulization TID  . magnesium oxide  200 mg Oral Daily  .  metoprolol  2.5 mg Intravenous Q8H  . multivitamin with minerals  1 tablet Oral Daily  . omega-3 acid ethyl esters  1 capsule Oral BID  . pantoprazole  40 mg Oral Q0600  . piperacillin-tazobactam (ZOSYN)  IV  3.375 g Intravenous Q8H  . 0.9 % sodium chloride with kcl   Intravenous Once  . sodium chloride flush  3 mL Intravenous Q12H  . vitamin E  400 Units Oral Daily   Continuous Infusions: . sodium chloride       LOS: 4 days    Chipper Oman, MD Pager: Text Page via [email or web address]  [phone]  If 7PM-7AM, please contact night-coverage [email or web address] Password [facility] 06/10/2016, 8:34 AM

## 2016-06-10 NOTE — Progress Notes (Signed)
General Surgery [facility]  Assessment & Plan:  HD#5 - mesenteric hematoma/abscess at site of previous sigmoid colectomy (08/2015)             WBC normal, afebrile             CTA 3/11 reviewed - small extraluminal gas, no abscess             Continue to hold anticoagulation             Clear liquid diet as tolerated  Encourage OOB, ambulation  Continue to monitor - may need to repeat CT abd/pelvis in 2-3 days.                    Earnstine Regal, MD, [facility]       Office: [phone]    Subjective: Patient in bed watching TV.  States pain is less, improving.  Having BM's.  Objective: Vital signs in last 24 hours: Temp:  [97.6 F (36.4 C)-98.9 F (37.2 C)] 97.9 F (36.6 C) (03/13 0800) Pulse Rate:  [25-118] 118 (03/13 1000) Resp:  [18-30] 25 (03/13 1000) BP: (105-176)/(46-105) 130/105 (03/13 1000) SpO2:  [92 %-98 %] 97 % (03/13 1000) Last BM Date: 06/09/16  Intake/Output from previous day: 03/12 0701 - 03/13 0700 In: 153 [I.V.:3; IV Piggyback:150] Out: 450 [Urine:450] Intake/Output this shift: Total I/O In: 34.3 [I.V.:34.3] Out: 200 [Urine:200]  Physical Exam: HEENT - sclerae clear, mucous membranes moist Neck - soft Abdomen - softer, protuberant; BS present; mild to moderate tenderness LLQ, no mass Ext - no edema, non-tender Neuro - alert & oriented, no focal deficits  Lab Results:   Recent Labs  06/09/16 1602 06/10/16 0319  WBC 7.4 9.4  HGB 10.5* 11.3*  HCT 30.9* 32.1*  PLT 195 216   BMET  Recent Labs  06/09/16 0405 06/10/16 0527  NA 143 144  K 3.9 3.1*  CL 109 109  CO2 26 27  GLUCOSE 154* 126*  BUN 23* 32*  CREATININE 0.71 1.02  CALCIUM 9.1 9.0   PT/INR  Recent Labs  06/08/16 0356 06/09/16 0405  LABPROT 16.9* 16.4*  INR 1.36 1.31   Comprehensive Metabolic Panel:    Component Value Date/Time   NA 144 06/10/2016 0527   NA 143 06/09/2016 0405   K 3.1 (L) 06/10/2016 0527    K 3.9 06/09/2016 0405   CL 109 06/10/2016 0527   CL 109 06/09/2016 0405   CO2 27 06/10/2016 0527   CO2 26 06/09/2016 0405   BUN 32 (H) 06/10/2016 0527   BUN 23 (H) 06/09/2016 0405   CREATININE 1.02 06/10/2016 0527   CREATININE 0.71 06/09/2016 0405   CREATININE 0.95 04/29/2016 1633   CREATININE 0.83 04/17/2015 1535   GLUCOSE 126 (H) 06/10/2016 0527   GLUCOSE 154 (H) 06/09/2016 0405   CALCIUM 9.0 06/10/2016 0527   CALCIUM 9.1 06/09/2016 0405   AST 26 09/07/2015 0427   AST 48 (H) 09/03/2015 1415   ALT 21 09/07/2015 0427   ALT 27 09/03/2015 1415   ALKPHOS 74 09/07/2015 0427   ALKPHOS 111 09/03/2015 1415   BILITOT 1.2 09/07/2015 0427   BILITOT 2.2 (H) 09/03/2015 1415   PROT 6.1 (L) 09/07/2015 0427   PROT 8.2 (H) 09/03/2015 1415   ALBUMIN 3.5 09/07/2015 0427   ALBUMIN 4.9 09/03/2015 1415    Studies/Results: Ct Abdomen Pelvis Wo Contrast  Result Date: 06/08/2016 CLINICAL DATA:  83 year old  male with increasing left abdominal and pelvic pain and hypoxia. Follow-up hematoma adjacent to the sigmoid colon. EXAM: CT ABDOMEN AND PELVIS WITHOUT CONTRAST TECHNIQUE: Multidetector CT imaging of the abdomen and pelvis was performed following the standard protocol without IV contrast. COMPARISON:  05/31/2016 CT FINDINGS: Please note that parenchymal abnormalities may be missed without intravenous contrast. Lower chest: New right lower lobe consolidation/atelectasis and left basilar atelectasis noted. Trace bilateral pleural effusions are present. Moderate to marked cardiomegaly identified. Hepatobiliary: No acute acute hepatic or significant gallbladder abnormalities. No evidence of biliary dilatation. Pancreas: Unremarkable Spleen: Unremarkable Adrenals/Urinary Tract: No acute renal, adrenal or bladder abnormalities. Stomach/Bowel: Hematoma within the lower left pelvis has slightly decreased in size, now measuring 6.7 x 9.9 cm in greatest diameter, previously 8.4 x 12 cm at the same level. Apparent  small foci of extraluminal gas within the hematoma are noted and connection to the adjacent colon may be present. Gaseous distension of portions of the transverse and sigmoid colon noted. Mild gastric distention is present. No dilated small bowel loops are present. There is no evidence of pneumoperitoneum or new areas of hemorrhage. Vascular/Lymphatic: Aortic atherosclerotic calcifications noted without aneurysm. No enlarged lymph nodes. Reproductive: Prostate is unremarkable. Other: Mild mesenteric, subcutaneous and presacral edema noted. No new focal collections/ abscess identified. Musculoskeletal: No acute abnormality. Lumbar spine surgical changes and degenerative changes again noted. IMPRESSION: Decreasing size of left pelvic hematoma, but now there are apparent foci of extraluminal gas within the hematoma which suggests a connection with the adjacent colon. No evidence of focal abscess or gross pneumoperitoneum at this time. New gaseous distension of portions of the transverse and sigmoid colon and stomach. No dilated small bowel loops identified. New right lower lobe consolidation/atelectasis and mild left basilar atelectasis. New trace bilateral pleural effusions, and mild subcutaneous and mesenteric edema. Abdominal aortic atherosclerosis. Electronically Signed   By: Margarette Canada M.D.   On: 06/08/2016 18:21      Ceci Taliaferro Jerilynn Mages 06/10/2016

## 2016-06-10 NOTE — Progress Notes (Signed)
    Progress Note   Subjective  Chief Complaint: LLQ pain, heamtochezia  Patient is more alert this morning and tells me that he has a decreased amount of left lower quadrant pain. Apparently he urinated and had some blood in his urine this morning but per him has had no further bowel movements. Patient is eating a clear liquid diet at time of my interview. Overall he seems much better.    Objective   Vital signs in last 24 hours: Temp:  [97.6 F (36.4 C)-98.9 F (37.2 C)] 97.9 F (36.6 C) (03/13 0800) Pulse Rate:  [25-117] 112 (03/13 0800) Resp:  [18-31] 20 (03/13 0900) BP: (105-187)/(46-105) 132/64 (03/13 0900) SpO2:  [92 %-98 %] 98 % (03/13 0900) Last BM Date: 06/09/16 General: Caucasian male in NAD Heart:  Regular rate and rhythm; no murmurs Lungs: Respirations even and unlabored, lungs CTA bilaterally Abdomen:  Soft, mild ttp LLQ and nondistended. Normal bowel sounds. Extremities:  Without edema. Neurologic:  Alert and oriented,  grossly normal neurologically. Psych:  Cooperative. Normal mood and affect.  Lab Results:  Recent Labs  06/09/16 0405 06/09/16 1602 06/10/16 0319  WBC 6.8 7.4 9.4  HGB 9.1* 10.5* 11.3*  HCT 27.6* 30.9* 32.1*  PLT 159 195 216   BMET  Recent Labs  06/08/16 0356 06/09/16 0405 06/10/16 0527  NA 138 143 144  K 3.2* 3.9 3.1*  CL 104 109 109  CO2 27 26 27   GLUCOSE 131* 154* 126*  BUN 26* 23* 32*  CREATININE 0.90 0.71 1.02  CALCIUM 8.5* 9.1 9.0       Assessment / Plan:   Assessment: 1. Hematochezia: Noted further for 2 days, again per Dr. Carlean Purl questioned hematomas decompressing into colon and that is where the blood was coming from the hemoglobin has improved today via point 2. Left lower quadrant pain: Improved today per the patient 3. Chronic Coumadin for A. fib 4. ABL anemia status post PRBCs X1: Stable now 5. Thrombocytopenia  Plan: 1. No change to recommendations currently as patient is improving 2. Change CBC to  every 24 hours 3. Please await any further recommendations from Dr. Carlean Purl    LOS: 4 days   Lavone Nian [facility]  06/10/2016, 9:39 AM  Pager # [phone]  Overall better.  I am going to sign off but we will be available to return to see him and if diagnostic or therapeutic sigmoidoscopy needed (does not seem to be at present) can do that  Gatha Mayer, MD, [facility] [phone] (pager) [phone] after 5 PM, weekends and holidays  06/10/2016 12:42 PM

## 2016-06-11 ENCOUNTER — Inpatient Hospital Stay (HOSPITAL_COMMUNITY): Payer: Medicare Other | Admitting: Certified Registered Nurse Anesthetist

## 2016-06-11 ENCOUNTER — Other Ambulatory Visit (HOSPITAL_COMMUNITY): Payer: Medicare Other

## 2016-06-11 ENCOUNTER — Inpatient Hospital Stay (HOSPITAL_COMMUNITY): Payer: Medicare Other

## 2016-06-11 DIAGNOSIS — I469 Cardiac arrest, cause unspecified: Secondary | ICD-10-CM

## 2016-06-11 LAB — URINE CULTURE: Culture: NO GROWTH

## 2016-06-11 LAB — GLUCOSE, CAPILLARY: GLUCOSE-CAPILLARY: 180 mg/dL — AB (ref 65–99)

## 2016-06-11 MED ORDER — PHENYLEPHRINE HCL 10 MG/ML IJ SOLN
INTRAMUSCULAR | Status: DC | PRN
  Administered 2016-06-11 (×4): 120 ug via INTRAVENOUS

## 2016-06-11 MED ORDER — NALOXONE HCL 0.4 MG/ML IJ SOLN: 0.4000 mg | INTRAMUSCULAR | Status: DC | PRN

## 2016-06-11 MED ORDER — NALOXONE HCL 0.4 MG/ML IJ SOLN
INTRAMUSCULAR | Status: AC
  Filled 2016-06-11: qty 1

## 2016-06-11 MED ORDER — ETOMIDATE 2 MG/ML IV SOLN
INTRAVENOUS | Status: DC | PRN
  Administered 2016-06-11: 10 mg via INTRAVENOUS

## 2016-06-11 MED ORDER — NOREPINEPHRINE BITARTRATE 1 MG/ML IV SOLN: 0.0000 ug/min | INTRAVENOUS | Status: DC

## 2016-06-11 MED ORDER — SUCCINYLCHOLINE CHLORIDE 20 MG/ML IJ SOLN
INTRAMUSCULAR | Status: DC | PRN
  Administered 2016-06-11: 100 mg via INTRAVENOUS

## 2016-06-11 MED ORDER — NOREPINEPHRINE BITARTRATE 1 MG/ML IV SOLN
0.0000 ug/min | INTRAVENOUS | Status: DC
  Administered 2016-06-11: 5 ug/min via INTRAVENOUS
  Administered 2016-06-11: 10 ug/min via INTRAVENOUS
  Filled 2016-06-11: qty 4

## 2016-06-11 MED FILL — Medication: Qty: 1 | Status: AC

## 2016-06-29 NOTE — Progress Notes (Signed)
Pt found unresponsive and not responding to pain. Patient found with slow, shallow breathing. Rapid RN as well as code team called to room.  00:31  IVF ran wide open at 999,  BP 73/48,  O2 92% bag assisted, Pulse 61 00:35  In Afib on defibrillator with pulse of 94, patient still breathing on his own bag assisted. CBG 180 00:36  0.4 mg of Narcan given IV,  BP 68/44, Pulse 117, O2 92% bag assisted 00:40  BP 74/54, Pulse 145, O2 92% bag assisted  00:41  Abd ultrasound performed by Code MD Dr. Ellender Hose 00:43  BP 120/69, pulse 90, O2 95% bag assisted  00:47  Intubated by CRNA and respiratory team,  BP 63/40, pulse 109, O2 100% bag assisted  00:52  BP 68/52, O2 98% bag assisted, pulse 113 00:55  Levophed IV started at 31mcg Patient transferred to ICU by RN, Rapid RN, and Respiratory therapist

## 2016-06-29 NOTE — Progress Notes (Signed)
RT assisted with patient breathing via Ambu Bag ,while preparing patient for intubation. End tidal was 43. ET tube was inserted without any issues.

## 2016-06-29 NOTE — Significant Event (Signed)
Rapid Response Event Note  Overview: Patient was not apnic or pulseless upon arrival. He was unresponsive, mottled. Bag assisted ventilations provided, fluid bolus      Initial Focused Assessment:Patient became suddenly unresponsive. He had been complaining of severe pain and restless. He had received pain medication. Upon becoming unresponsive he became bradycardic and shallow respirations. BP dropped. Patients abdomen was increasingly distended and hard.    Interventions:NS fluid bolus started, bag assisted respirations until intubated. ED physician arrived, narcan was given. Intubated to protect airway. CRNA gave IV Neo and Levo gtt was hung. He was transferred to 1238 and seen by PCCM and Dr. Excell Seltzer. His son was updated by phone and was enroute.  Plan of Care (if not transferred):  Event Summary:   at      at          Pricilla Riffle

## 2016-06-29 NOTE — Progress Notes (Signed)
Pt had complaints of severe abdominal pain. Upon assessment pt's abdomin was severely distended, and rigid. Pt was given Fentanyl 79mcg for pain control. About 45 mins later pt complained that pain was unrelieved by Fentanyl.   While trying to page for more pain medication, pt's guest in the room paged out stating that pt had become unresponsive. Upon entering the room, pt's pupils were fixed, he didn't respond to his name or painful stimuli. At this point rapid response team was called. Rapid response made decision to call a code due to pt's respiratory status, although pt had never lost a pulse. Code team arrived to assess pt. Pt was ultimately intubated at bedside and transferred to ICU.  Conception Oms

## 2016-06-29 NOTE — Anesthesia Procedure Notes (Signed)
Procedure Name: Intubation Date/Time: 18-Jun-2016 12:46 AM Performed by: Montel Clock Pre-anesthesia Checklist: Patient identified, Emergency Drugs available, Suction available, Patient being monitored and Timeout performed Patient Re-evaluated:Patient Re-evaluated prior to inductionOxygen Delivery Method: Circle system utilized Preoxygenation: Pre-oxygenation with 100% oxygen Intubation Type: IV induction and Rapid sequence Ventilation: Mask ventilation without difficulty Laryngoscope Size: Mac and 3 Grade View: Grade I Tube type: Subglottic suction tube Tube size: 7.5 mm Number of attempts: 1 Airway Equipment and Method: Stylet Placement Confirmation: ETT inserted through vocal cords under direct vision,  positive ETCO2 and breath sounds checked- equal and bilateral Secured at: 23 cm Tube secured with: Tape Dental Injury: Teeth and Oropharynx as per pre-operative assessment

## 2016-06-29 NOTE — Progress Notes (Signed)
[place] Progress Note Patient Name: Ian Sullivan. DOB: [birth year] MRN: [id number]   Date of Service  01-Jul-2016  HPI/Events of Note  Pt intubated, currently on levophed @ 50 mcs, receiving fluid bolus with persistent hypotension; SBP=50's. Sinus brady with HR=40's. Pt unresponsive, FiO2=100%.  Abdomen distended.  D/w NP.   eICU Interventions  Family has opted to make pt DNR, continue current measures without escalation. Expect that patient will pass away soon. Will wait for family arrival and consider one way extubation at that time.         Laverle Hobby July 01, 2016, 2:07 AM

## 2016-06-29 NOTE — Progress Notes (Signed)
Pt expired at 0244 per RN and was removed from vent.  Pt remains intubated due to possible ME case.

## 2016-06-29 NOTE — Progress Notes (Signed)
RT bagged patient during transport to ICU from 1442 . Patient tolerated well.

## 2016-06-29 NOTE — Consult Note (Signed)
Name: Jihaad Bruschi. MRN: [id number] DOB: 1927/08/21    ADMISSION DATE:  06/14/2016 CONSULTATION DATE:  June 14, 2016  REFERRING MD :  Elon Jester  CHIEF COMPLAINT:  VDRF   HISTORY OF PRESENT ILLNESS:  Avram Danielson. is a 83 y.o. male with a PMH as outlined below.  He was admitted 06/09/2016 with GI bleed due to active hemorrhage in proximal sigmoid mesocolon with moderately large volume of blood around the colon, complicated by the fact that he is on coumadin for A.fib. He was evaluated by surgery and GI and it was felt that best plan would be to reverse his INR and manage conservatively and consider flex sig after a few days.  During early AM hours 14-Jun-2016, pt suddenly developed severe abdominal pain along with increased abdominal distention.  He then became bradycardic and hypotensive followed by AMS.  Code blue was called due to agonal respirations and he was subsequently intubated.  He was then transferred to the ICU where he had profound shock and worsening in abdominal distention along with new mottling of lower extremities.  Dr. Excell Seltzer of surgery spoke with pt's son and decision was made to not pursue any surgical intervention even if pt were stabilized given the fact that he would require colostomy and open abdomen.  Pt's BP then remained in 62'B systolic despite 63SLH Levophed.   I spoke with pt's son again and explained current circumstances, organ failures, and extremely poor prognosis given his underlying issue of massive GI bleeding (likely ruptured hematoma).  Son stated that pt would never want to undergo heroic measures especially in this situation where things like CPR / defibrillation would not change pt's outcome.  Code status was changed to DNR with continued supportive care in hopes that pt will survive until son arrives at hospital.   PAST MEDICAL HISTORY :   has a past medical history of Anemia; Anxiety state, unspecified; Atrial fibrillation ([place]); Barrett's esophagus;  Depressive disorder, not elsewhere classified; Diverticulosis of colon (without mention of hemorrhage); Duodenitis without mention of hemorrhage; Dyskinesia of esophagus; GERD (gastroesophageal reflux disease); Hereditary and idiopathic peripheral neuropathy (10/23/2014); Hiatal hernia; Irritable bowel syndrome; Long term (current) use of anticoagulants; Other and unspecified hyperlipidemia; Skin cancer (melanoma) ([place]); Status post dilation of esophageal narrowing; Unspecified essential hypertension; Unspecified gastritis and gastroduodenitis without mention of hemorrhage; Unspecified hemorrhoids without mention of complication; and Unspecified sleep apnea.  has a past surgical history that includes Nissen fundoplication (7342); TURP vaporization (1980s); Carpal tunnel release (Bilateral, 2003); Appendectomy (1945); Tonsillectomy; Vasectomy; Lumbar laminectomy; small bowel obsturction; Cardiac catheterization (1991); central decompressive laminectomy; Esophagogastroduodenoscopy; Colonoscopy; Spinal cord stimulator implant (06/2011); EUS (11/13/2011); Flexible sigmoidoscopy (N/A, 09/04/2015); and Partial colectomy (N/A, 09/07/2015). Prior to Admission medications   Medication Sig Start Date End Date Taking? Authorizing Provider  acetaminophen (TYLENOL) 500 MG tablet Take 1,000 mg by mouth every 6 (six) hours as needed for mild pain.   Yes Historical Provider, MD  amLODipine (NORVASC) 10 MG tablet TAKE 1 TABLET BY MOUTH EVERY DAY 02/08/16  Yes Lelon Perla, MD  Calcium 600 MG tablet Take 1,200 mg by mouth daily.    Yes Historical Provider, MD  cholecalciferol (VITAMIN D) 1000 UNITS tablet Take 1,000 Units by mouth daily.     Yes Historical Provider, MD  Ferrous Sulfate (IRON) 325 (65 FE) MG TABS Take 1 tablet by mouth daily.     Yes Historical Provider, MD  fish oil-omega-3 fatty acids 1000 MG capsule Take 2 capsules by mouth 2 (two)  times daily.    Yes Historical Provider, MD  FLUoxetine (PROZAC) 20 MG tablet  Take 20 mg by mouth daily.   Yes Historical Provider, MD  furosemide (LASIX) 40 MG tablet Take 1 tablet (40 mg total) by mouth daily. 04/21/16  Yes Lelon Perla, MD  HYDROcodone-acetaminophen (NORCO/VICODIN) 5-325 MG tablet Take 1 tablet by mouth every 6 (six) hours as needed for moderate pain. 05/15/16  Yes Jessy Oto, MD  KLOR-CON M10 10 MEQ tablet TAKE 1 TABLET BY MOUTH EVERY DAY 04/29/16  Yes Lelon Perla, MD  losartan (COZAAR) 100 MG tablet TAKE 1 TABLET (100 MG TOTAL) BY MOUTH DAILY. 11/14/15  Yes Lelon Perla, MD  Magnesium 100 MG CAPS Take 100 mg by mouth daily.    Yes Historical Provider, MD  Multiple Vitamin (MULTIVITAMIN) tablet Take 1 tablet by mouth daily.   Yes Historical Provider, MD  vitamin E 400 UNIT capsule Take 400 Units by mouth daily.     Yes Historical Provider, MD  warfarin (COUMADIN) 5 MG tablet Take 1-1.5 tablets by mouth daily as directed by coumadin clinic Patient taking differently: Take 1 tablet (5 MG) on Tuesday, Thursday, Saturday and Sunday. Take 1 1/2 tablet (7.5 MG) on Monday, Wednesday and friday 05/19/16  Yes Lelon Perla, MD  acetaminophen (TYLENOL) 325 MG tablet Take 2 tablets (650 mg total) by mouth every 6 (six) hours as needed for mild pain or moderate pain. Patient not taking: Reported on 05/15/2016 09/13/15   Oswald Hillock, MD   Allergies  Allergen Reactions  . Horse-Derived Products     FAMILY HISTORY:  family history includes Coronary artery disease in his father; Heart disease in his other; Stroke in his mother. SOCIAL HISTORY:  reports that he quit smoking about 44 years ago. He has never used smokeless tobacco. He reports that he drinks about 0.6 oz of alcohol per week . He reports that he does not use drugs.  REVIEW OF SYSTEMS:   Unable to obtain as pt is encephalopathic.   SUBJECTIVE:   On vent, unresponsive.  VITAL SIGNS: Temp:  [97.6 F (36.4 C)-98.8 F (37.1 C)] 98.7 F (37.1 C) (03/13 2048) Pulse Rate:  [59-118] 90  (03/14 0104) Resp:  [18-26] 26 (03/14 0104) BP: (105-161)/(45-105) 132/78 (03/13 2048) SpO2:  [93 %-100 %] 100 % (03/14 0104) FiO2 (%):  [100 %] 100 % (03/14 0104)  PHYSICAL EXAMINATION: General: Adult male, critically ill. Neuro: On vent, unresponsive. HEENT: [place]/AT. PERRL, sclerae anicteric. Cardiovascular: RRR, no M/R/G.  Lungs: Respirations agonal (with vent supported breaths), coarse bilaterally. Abdomen: BS absent.  Abd extremely distended and rigid with hyperresonance to percussion and subcutaneous emphysema noted in umbilical region.  Musculoskeletal: No gross deformities, no edema.  Skin: Mottled lower extremities.  Skin cool.    Recent Labs Lab 06/08/16 0356 06/09/16 0405 06/10/16 0527  NA 138 143 144  K 3.2* 3.9 3.1*  CL 104 109 109  CO2 27 26 27   BUN 26* 23* 32*  CREATININE 0.90 0.71 1.02  GLUCOSE 131* 154* 126*    Recent Labs Lab 06/09/16 0405 06/09/16 1602 06/10/16 0319  HGB 9.1* 10.5* 11.3*  HCT 27.6* 30.9* 32.1*  WBC 6.8 7.4 9.4  PLT 159 195 216   No results found.  DISCUSSION: 83 y.o. M admitted 06/16/2016 with GI bleed due to active hemorrhage in proximal sigmoid mesocolon with moderately large volume of blood around the colon, complicated by the fact that he is on coumadin for A.fib.  Was being followed by surgery and GI and treated conservatively after INR reversed. During early AM hours 3/14, had acute decompensation with severe abdominal pain, abdominal distention, AMS, hypotension, agonal respirations.  He was subsequently intubated and transferred to ICU. He was then evaluated by Dr. Excell Seltzer who spoke with pt's son, [name] and informed him that pt was too unstable for surgery and even if he was stabilized, his chances of survival through surgery were slim.  He also explained that pt would need colostomy and open abdomen.  Pt's son stated that pt would never wish for such things; therefore, decision was made to not pursue surgical  intervention. Given his persistent instability and hemorrhagic shock, I then had discussion with [name] and explained Mr. Stubblefield current circumstances, organ failures, and poor prognosis.  [name] informed me that pt would never want to undergo things like CPR / defibrillation especially in light of his underlying condition and the fact that such heroic measures would not change the outcome.  It was agreed that code status would be listed as DNR and that we would continue supportive care in hopes that [name] could make it to the hospital in time before pt expires. Emotional support offered.  We will continue full vent support along with vasopressor support at current max dose of 19mcg/min of Levophed.  I have asked nursing staff to not escalate care or add further vasopressors. Once son arrives, will likely withdraw care and move to full comfort care.   CC time: 45 min.   Montey Hora, [facility] Pager: [phone]  or [phone] July 03, 2016, 1:53 AM

## 2016-06-29 NOTE — Progress Notes (Signed)
Patient arrived to ICU on Levophed with a 1053mL bolus of NS running. Levo titrated up as needed. Per MD on arrival said to run Levo @ 50 mcg/min. Bolus finished and another bolus of 1013mL of NS was hung. On assessment SQ air in abdomen was noted along with increasing distention of the belly. Pt was very pale and mottled, unresponsive, and having bright red blood per rectum. Right pupil was larger than left, dilated and unreactive. Providers spoke with family and patient was made DNR. No escalation of care per MD but Levo was left running while the family came to bedside from [facility]. 34mcg Fentanyl was given for comfort. Patients BP remained low. SPO2 unable to obtain. HR started in afib and progressed to bradycardia and junctional. Agonal breathing pattern observed. At 0244 asystole was seen on the monitor. Malachy Mood, RN and I listened and no heartbeat auscultated. Time of death was called. Friend at bedside and son on his way at the time. Son notified on arrival.

## 2016-06-29 NOTE — ED Provider Notes (Addendum)
Code Blue CONSULT NOTE  Chief Complaint: Cardiac arrest/unresponsive   Level V Caveat: Unresponsive  History of present illness: I was contacted by the hospital for a CODE BLUE cardiac arrest upstairs and presented to the patient's bedside.   On my arrival to the room, patient with palpable but thready pulses. I received a brief report from the nurse. Patient is here with reported lower GI bleed with possible extraluminal hematoma on CT scan. He is on blood thinners but this has been reversed. On my assessment, patient breathing irregularly. Breath sounds are diminished. He has marked abdominal distention. He does not follow commands.  ROS: Unable to obtain, Level V caveat  Scheduled Meds: . amLODipine  10 mg Oral Daily  . calcium carbonate  1,250 mg Oral Daily  . cholecalciferol  1,000 Units Oral Daily  . ferrous sulfate  325 mg Oral Daily  . FLUoxetine  20 mg Oral Daily  . furosemide  40 mg Oral Daily  . ipratropium  0.5 mg Nebulization TID  . levalbuterol  0.63 mg Nebulization TID  . magnesium oxide  200 mg Oral Daily  . metoprolol  5 mg Intravenous Q8H  . multivitamin with minerals  1 tablet Oral Daily  . naloxone      . omega-3 acid ethyl esters  1 capsule Oral BID  . pantoprazole  40 mg Oral Q0600  . piperacillin-tazobactam (ZOSYN)  IV  3.375 g Intravenous Q8H  . sodium chloride flush  3 mL Intravenous Q12H  . vitamin E  400 Units Oral Daily   Continuous Infusions: PRN Meds:.acetaminophen, fentaNYL (SUBLIMAZE) injection, hydrALAZINE, HYDROcodone-acetaminophen, HYDROcodone-homatropine, naLOXone (NARCAN)  injection, ondansetron, zolpidem Past Medical History:  Diagnosis Date  . Anemia   . Anxiety state, unspecified    past at death of spouse  . Atrial fibrillation ([place])   . Barrett's esophagus   . Depressive disorder, not elsewhere classified    past at death of spouse  . Diverticulosis of colon (without mention of hemorrhage)   . Duodenitis without mention of  hemorrhage   . Dyskinesia of esophagus   . GERD (gastroesophageal reflux disease)   . Hereditary and idiopathic peripheral neuropathy 10/23/2014  . Hiatal hernia   . Irritable bowel syndrome   . Long term (current) use of anticoagulants   . Other and unspecified hyperlipidemia   . Skin cancer (melanoma) ([place])   . Status post dilation of esophageal narrowing   . Unspecified essential hypertension   . Unspecified gastritis and gastroduodenitis without mention of hemorrhage   . Unspecified hemorrhoids without mention of complication   . Unspecified sleep apnea    Past Surgical History:  Procedure Laterality Date  . APPENDECTOMY  1945  . CARDIAC CATHETERIZATION  1991  . CARPAL TUNNEL RELEASE Bilateral 2003  . central decompressive laminectomy     L2-L5  . COLONOSCOPY    . ESOPHAGOGASTRODUODENOSCOPY    . EUS  11/13/2011   Procedure: LOWER ENDOSCOPIC ULTRASOUND (EUS);  Surgeon: Milus Banister, MD;  Location: [facility];  Service: Endoscopy;  Laterality: N/A;  . FLEXIBLE SIGMOIDOSCOPY N/A 09/04/2015   Procedure: FLEXIBLE SIGMOIDOSCOPY;  Surgeon: Manus Gunning, MD;  Location: [facility];  Service: Gastroenterology;  Laterality: N/A;  . LUMBAR LAMINECTOMY    . NISSEN FUNDOPLICATION  1517  . PARTIAL COLECTOMY N/A 09/07/2015   Procedure: OPEN SIGMOID COLECTOMY WITH ANASTAMOSIS;  Surgeon: Johnathan Hausen, MD;  Location: [facility];  Service: General;  Laterality: N/A;  . small bowel obsturction     x4  .  SPINAL CORD STIMULATOR IMPLANT  06/2011   Dr. Anselm Lis  . TONSILLECTOMY    . TURP VAPORIZATION  1980s  . VASECTOMY     Social History   Social History  . Marital status: Widowed    Spouse name: N/A  . Number of children: 1  . Years of education: N/A   Occupational History  . retired Retired   Social History Main Topics  . Smoking status: Former Smoker    Quit date: 03/31/1972  . Smokeless tobacco: Never Used  . Alcohol use 0.6 oz/week    1 Shots of liquor per week      Comment: ocass  . Drug use: No  . Sexual activity: Not on file   Other Topics Concern  . Not on file   Social History Narrative   Alcohol use- yes occasional   Allergies  Allergen Reactions  . Horse-Derived Products     Last set of Vital Signs (not current) Vitals:   06/10/16 2018 06/10/16 2048  BP:  132/78  Pulse: 100 90  Resp: 18 18  Temp:  98.7 F (37.1 C)      Physical Exam Gen: unresponsive Cardiovascular: weak but thready femoral pulses b/l Resp: Agonal spontaneous respirations. Breath sounds equal bilaterally with bagging  Abd: Markedly distended diffusely, tight, and tympanitic  Neuro: GCS 3, unresponsive to pain  HEENT: No blood in posterior pharynx Neck: No crepitus  Musculoskeletal: No deformity  Skin: cool, mottled  Procedures  INTUBATION I supervised intubation performed by CRNA. See separate note. Patient intubated successfully with good BS, +EtCO2.  CRITICAL CARE Performed by: Evonnie Pat Total critical care time: 35 Critical care time was exclusive of separately billable procedures and treating other patients. Critical care was necessary to treat or prevent imminent or life-threatening deterioration. Critical care was time spent personally by me on the following activities: development of treatment plan with patient and/or surrogate as well as nursing, discussions with consultants, evaluation of patient's response to treatment, examination of patient, obtaining history from patient or surrogate, ordering and performing treatments and interventions, ordering and review of laboratory studies, ordering and review of radiographic studies, pulse oximetry and re-evaluation of patient's condition.  Medical Decision making  83 year old male with reported history of extraluminal hematoma from GI bleed now with bradycardia and weak pulses. Given his abdominal distention, I am concerned for likely intra-abdominal hemorrhage. I'm concerned he is developing  abdominal compartment syndrome. No rectal bleeding at this time.  Assessment and Plan  Primary: A: Secured, intubated - will need f/u CXR B: Symmetric, BVM assisted C: Patient given 2L NS, neostick x 2 with intubation, and I have started the patient on levophed gtt D: GCS <9, not requiring sedation  Plan: -Abdominal distension: Repeat labs, CBC sent. Concern for abdominal compartment syndrome. Will have RN place foley to decompress bladder, continue fluids, and pressors PRN. I discussed with Dr. Excell Seltzer of [facility] who will see pt in ICU. Hospitalist to discuss goals of care with son. I have asked for additional pRBCs as well. -Bradycardia/hypotension: 2/2 abdominal compartment syndrome as above. Decompress bladder. PPV. Surgical consult. IVF, Levophed running. -Likely GIB: Coags, reverse PRN -Unfortunately, I suspect this is not survivable given pt's age, severe comorbidity, and critical illness with signs of shock. According to Hospitalist, pt is full code at this time but will need to re-addressed.     Duffy Bruce, MD 07-06-2016 0111    Duffy Bruce, MD 2016-07-06 [phone]

## 2016-06-29 NOTE — Death Summary Note (Signed)
DEATH SUMMARY   Patient Details  Name: Ian Sullivan. MRN: [id number] DOB: 21-Mar-1928  Admission/Discharge Information   Admit Date:  2016/06/30  Date of Death: Date of Death: July 05, 2016  Time of Death: Time of Death: 0144  Length of Stay: 5  Referring Physician: [name], MD   Reason(s) for Hospitalization  Rectal bleeding and abdominal pain   Diagnoses  Preliminary cause of death: Hemorrhagic shock and encephalopathy syndrome, likely due to ruptured colon hematoma. Secondary Diagnoses (including complications and co-morbidities):  Principal Problem:   GIB (gastrointestinal bleeding) Active Problems:   Depression   Essential hypertension   ATRIAL FIBRILLATION   Chronic diastolic CHF (congestive heart failure) ([place])   Encounter for therapeutic drug monitoring   GERD (gastroesophageal reflux disease)   Peritoneal bleeding   Pneumonia of right middle lobe due to infectious organism [facility])   Rectal bleeding   SOB (shortness of breath)   Hematochezia   Hematoma of colon   Brief Hospital Course (including significant findings, care, treatment, and services provided and events leading to death)  Ian Sullivan. is a 83 y.o. year old male with medical history significant for A. fib on Coumadin, hypertension, GERD, depression and anxiety. Status post sigmoid colectomy with anastomosis in June 2017 due to sigmoid volvulus, presented to the emergency department with rectal bleeding and abdominal pain. CT of the abdomen was performed showing intraluminal and extraluminal bleeding with hematoma around the sigmoid colon. Patient was admitted for surgical and gastrointestinal evaluation and to monitor hemoglobin. Warfarin was reversed with vitamin K and fresh frozen plasma. Patient was transfused 1 unit of PRBCs and hemoglobin remained stable during the hospital stay. Gi, IR and surgical team recommended conservative management, as patient was stable. On 06/08/16 patient had increase  in abdominal pain, a CT abdomen was repeated which showed decrease in left pelvic hematoma, but new extra luminal gas within the hematoma with the adjacent colon, with gas distention within the transverse and sigmoid colon and stomach. Also was found to have a new RLL pneumonia. Patient was started on IV zosyn. Case was dicussed with surgery which recommended continue to monitor due to possible need for colostomy which patient wanted to avoid. GI was planning for flex sig, but there was a risk for perforation so, it was decided to continue with conservative measures. On 06/10/16 patient was clinically improving, Hgb was going up and patient was tolerating diet well, so patient was transferred to telemetry floor. On the early morning of July 05, 2016 patient complained of severe abdominal pain and increase in abdominal distention which did not respond to pain medication, patient was sitting up in bed when all the sudden became altered and then unresponsive, rapid response team evaluated patient which was found with weak pulses, abdomen severely distended and agonal respiration. Code blue was called due agonal respirations patient was intubated. Patient was started on pressors due to severe hypotension, with minimal response and transferred to the ICU. Surgical team recommended not to pursue surgical intervention, due to extremely poor prognosis, family was informed of events, they expressed that patient wouldn't choose any heroic measures. Decision was to made to make patient DNR with no escalation of treatment.    Pertinent Labs and Studies  Significant Diagnostic Studies Ct Abdomen Pelvis Wo Contrast  Result Date: 06/08/2016 CLINICAL DATA:  83 year old male with increasing left abdominal and pelvic pain and hypoxia. Follow-up hematoma adjacent to the sigmoid colon. EXAM: CT ABDOMEN AND PELVIS WITHOUT CONTRAST TECHNIQUE: Multidetector CT imaging of  the abdomen and pelvis was performed following the standard protocol  without IV contrast. COMPARISON:  05/30/2016 CT FINDINGS: Please note that parenchymal abnormalities may be missed without intravenous contrast. Lower chest: New right lower lobe consolidation/atelectasis and left basilar atelectasis noted. Trace bilateral pleural effusions are present. Moderate to marked cardiomegaly identified. Hepatobiliary: No acute acute hepatic or significant gallbladder abnormalities. No evidence of biliary dilatation. Pancreas: Unremarkable Spleen: Unremarkable Adrenals/Urinary Tract: No acute renal, adrenal or bladder abnormalities. Stomach/Bowel: Hematoma within the lower left pelvis has slightly decreased in size, now measuring 6.7 x 9.9 cm in greatest diameter, previously 8.4 x 12 cm at the same level. Apparent small foci of extraluminal gas within the hematoma are noted and connection to the adjacent colon may be present. Gaseous distension of portions of the transverse and sigmoid colon noted. Mild gastric distention is present. No dilated small bowel loops are present. There is no evidence of pneumoperitoneum or new areas of hemorrhage. Vascular/Lymphatic: Aortic atherosclerotic calcifications noted without aneurysm. No enlarged lymph nodes. Reproductive: Prostate is unremarkable. Other: Mild mesenteric, subcutaneous and presacral edema noted. No new focal collections/ abscess identified. Musculoskeletal: No acute abnormality. Lumbar spine surgical changes and degenerative changes again noted. IMPRESSION: Decreasing size of left pelvic hematoma, but now there are apparent foci of extraluminal gas within the hematoma which suggests a connection with the adjacent colon. No evidence of focal abscess or gross pneumoperitoneum at this time. New gaseous distension of portions of the transverse and sigmoid colon and stomach. No dilated small bowel loops identified. New right lower lobe consolidation/atelectasis and mild left basilar atelectasis. New trace bilateral pleural effusions, and  mild subcutaneous and mesenteric edema. Abdominal aortic atherosclerosis. Electronically Signed   By: Margarette Canada M.D.   On: 06/08/2016 18:21   Ct Abdomen Pelvis W Contrast  Result Date: 06/15/2016 CLINICAL DATA:  Abdominal pain and weakness. Blood in stool. Anticoagulated. EXAM: CT ABDOMEN AND PELVIS WITH CONTRAST TECHNIQUE: Multidetector CT imaging of the abdomen and pelvis was performed using the standard protocol following bolus administration of intravenous contrast. CONTRAST:  100 mL Isovue 300 intravenous COMPARISON:  09/03/2015 FINDINGS: Lower chest: No acute abnormality. Hepatobiliary: Mild surface irregularities of the liver, raising the question of cirrhosis. No focal liver lesions. Gallbladder and bile ducts are unremarkable. Pancreas: Unremarkable. No pancreatic ductal dilatation or surrounding inflammatory changes. Spleen: Normal in size without focal abnormality. Adrenals/Urinary Tract: Adrenal glands are unremarkable. Kidneys are normal, without renal calculi, focal lesion, or hydronephrosis. Bladder is unremarkable. Stomach/Bowel: Fundoplication. Stomach is otherwise unremarkable. Small bowel is unremarkable. There is active hemorrhage in the proximal sigmoid mesocolon, proximal to the sigmoid anastomosis. There is blood surrounding the colon within the mesentery and continuing dependently into the peritoneum. Active hemorrhage is present at the time of this scan. Vascular/Lymphatic: The abdominal aorta is normal in caliber with moderate atherosclerotic calcification. No adenopathy is evident in the abdomen or pelvis. Reproductive: Unremarkable Other: No bowel obstruction. No extraluminal gas. Sigmoid anastomosis appears unremarkable. Musculoskeletal: No significant skeletal lesion. Posterior decompression with instrumented fusion at L4-5. IMPRESSION: 1. Active hemorrhage in the proximal sigmoid mesocolon with moderately large volume of blood around the colon and collecting dependently in the  peritoneum. 2. These results were called by telephone at the time of interpretation on 06/27/2016 at 5:23 am to Dr. Ivor Costa , who verbally acknowledged these results. 3. Mildly irregular liver, raising the question of cirrhosis. No focal liver lesion. Electronically Signed   By: Andreas Newport M.D.   On: 06/05/2016 05:23  Dg Chest Port 1 View  Result Date: 06/26/2016 CLINICAL DATA:  SOB today EXAM: PORTABLE CHEST - 1 VIEW COMPARISON:  09/03/2015 FINDINGS: Low lung volumes with Coarse interstitial markings in the lung bases right greater than left, slightly more prominent than on previous exam. Coarse perihilar bronchovascular markings. Stable cardiomegaly.  Atheromatous aorta. No effusion.  No pneumothorax. Visualized bones unremarkable. Surgical clips, left upper abdomen. Dorsal stimulator catheters project over the lower thoracic spine as before. IMPRESSION: 1. Low volumes with some increase in bibasilar atelectasis. 2. Stable cardiomegaly and postop changes. Electronically Signed   By: Lucrezia Europe M.D.   On: 06/04/2016 09:43   Dg Abd Portable 1v  Result Date: 06/16/2016 CLINICAL DATA:  Abdominal distension EXAM: PORTABLE ABDOMEN - 1 VIEW COMPARISON:  06/05/2016 FINDINGS: Scattered large and small bowel gas is noted. The stomach is distended with air is well. Fecal material is noted throughout the colon similar to that seen on recent CT examination. Postsurgical changes in the lower lumbar spine are noted. IMPRESSION: Gaseous distension of the stomach. Air in the colon similar to recent CT. Electronically Signed   By: Inez Catalina M.D.   On: 06/18/2016 11:40    Microbiology Recent Results (from the past 240 hour(s))  MRSA PCR Screening     Status: None   Collection Time: 06/26/2016 12:20 PM  Result Value Ref Range Status   MRSA by PCR NEGATIVE NEGATIVE Final    Comment:        The GeneXpert MRSA Assay (FDA approved for NASAL specimens only), is one component of a comprehensive MRSA  colonization surveillance program. It is not intended to diagnose MRSA infection nor to guide or monitor treatment for MRSA infections.   Culture, Urine     Status: None   Collection Time: 06/10/16  8:32 AM  Result Value Ref Range Status   Specimen Description URINE, RANDOM  Final   Special Requests NONE  Final   Culture   Final    NO GROWTH Performed at [facility], [address] [place], [place] [address]    Report Status 2016-06-20 FINAL  Final    Lab Basic Metabolic Panel:  Recent Labs Lab 06/22/2016 0406 06/12/2016 0338 06/13/2016 0702 06/08/16 0356 06/09/16 0405 06/10/16 0527  NA 141 140  --  138 143 144  K 4.3 3.4*  --  3.2* 3.9 3.1*  CL 107 109  --  104 109 109  CO2 27 24  --  27 26 27   GLUCOSE 110* 145*  --  131* 154* 126*  BUN 26* 24*  --  26* 23* 32*  CREATININE 0.96 0.91  --  0.90 0.71 1.02  CALCIUM 9.4 8.5*  --  8.5* 9.1 9.0  MG  --   --  1.7 1.8  --   --    Liver Function Tests: No results for input(s): AST, ALT, ALKPHOS, BILITOT, PROT, ALBUMIN in the last 168 hours.  Recent Labs Lab 06/26/2016 0406  LIPASE 28   No results for input(s): AMMONIA in the last 168 hours. CBC:  Recent Labs Lab 06/10/2016 0223  06/08/16 0356 06/08/16 1548 06/09/16 0405 06/09/16 1602 06/10/16 0319  WBC 8.3  < > 9.0 8.3 6.8 7.4 9.4  NEUTROABS 6.7  --   --   --   --   --   --   HGB 11.7*  < > 8.5* 9.4* 9.1* 10.5* 11.3*  HCT 34.7*  < > 24.6* 27.9* 27.6* 30.9* 32.1*  MCV 94.6  < >  94.6 95.5 94.8 93.6 94.1  PLT 181  < > 129* 149* 159 195 216  < > = values in this interval not displayed. Cardiac Enzymes:  Recent Labs Lab 06/10/16 0527  CKTOTAL 66   Sepsis Labs:  Recent Labs Lab 06/08/16 1548 06/09/16 0405 06/09/16 1602 06/10/16 0319 06/10/16 0527  PROCALCITON  --  0.53  --   --  5.80  WBC 8.3 6.8 7.4 9.4  --     Procedures/Operations  None performed    Chipper Oman, MD 06-26-2016, 12:06 PM

## 2016-06-29 NOTE — Progress Notes (Signed)
Called son, expressed condolences Explained did not feel at this juncture Autopsy was absolutely necessary-Son agreeable to not performing Autopsy and wanting to know next steps Mentioned I would do needful in terms of paperwork .Verneita Griffes, MD [facility] [phone]

## 2016-06-29 NOTE — Progress Notes (Signed)
Subjective: I was called to see the patient after he was found unresponsive in his room on the fourth floor and  CODE BLUE was called and he was intubated and transferred to the ICU. Patient has been hospitalized with a mesenteric hematoma in GI bleed near the site of previous sigmoid colectomy last summer. He had apparently been stable up through this afternoon. I have a report that very soon before he was found unresponsive he had been sitting up on the side of the bed complaining of increasing severe abdominal pain.  Objective: Vital signs in last 24 hours: Currently blood pressure is 50 systolic on pressors and heart rate 89 Last BM Date: 06/10/16  Intake/Output from previous day: 03/13 0701 - 03/14 0700 In: 78938 [P.O.:10142; I.V.:145; IV Piggyback:100] Out: 525 [Urine:525] Intake/Output this shift: Total I/O In: 50 [IV Piggyback:50] Out: 125 [Urine:125]  General appearance: Elderly male, responsive to pain, intubated in the ICU. Skin: Cool and mottled. Large area of ecchymosis in perineum and pelvis and proximal thighs GI: Abdomen is hugely and tensely distended. There is a small amount of bright red blood from the rectum. Extremities: Cool mottled and pulseless  Lab Results:   Recent Labs  06/09/16 1602 06/10/16 0319  WBC 7.4 9.4  HGB 10.5* 11.3*  HCT 30.9* 32.1*  PLT 195 216   BMET  Recent Labs  06/09/16 0405 06/10/16 0527  NA 143 144  K 3.9 3.1*  CL 109 109  CO2 26 27  GLUCOSE 154* 126*  BUN 23* 32*  CREATININE 0.71 1.02  CALCIUM 9.1 9.0   Repeat CBC and chemistries pending  Studies/Results: No results found.  Anti-infectives: Anti-infectives    Start     Dose/Rate Route Frequency Ordered Stop   06/09/16 0600  piperacillin-tazobactam (ZOSYN) IVPB 3.375 g     3.375 g 12.5 mL/hr over 240 Minutes Intravenous Every 8 hours 06/08/16 1941     06/08/16 2000  piperacillin-tazobactam (ZOSYN) IVPB 3.375 g     3.375 g 100 mL/hr over 30 Minutes  Intravenous  Once 06/08/16 1941 06/08/16 2125      Assessment/Plan: 83 year old male with chronic A. fib on anticoagulation which has now been reversed was admitted several days ago with a large hematoma adjacent to the colon and possibly a microperforation into the hematoma. He had been stable on nonoperative management. He has had an acute severe decline tonight with respiratory arrest. His abdomen is massively distended and rigid. He currently is in severe shock with a blood pressure in the 10F systolic on pressors. I discussed the situation with his significant other who is in attendance and also with his son who is the power of attorney over the phone. His son lives in [place] and his on his way. I discussed that he is critically ill and likely has acute bleeding or rupture of his hematoma in his abdomen. He is currently receiving maximum support. I told him that it appears that he will die without surgery but that his chance of surviving an operation is close to 0. He currently is not even stable enough to transfer to the operating room. His significant other and his son said he has expressed that he would rather die than have a colostomy. Neither of them feel that he would want to be put through surgery in this condition. He would need a colostomy and I don't believe his abdomen but could be closed and his prognosis would be extremely poor. I would not recommend surgical intervention and  they say that he would not want it and we agreed not to proceed with emergency surgery. Hospitalists are discussing CODE STATUS and the patient's son is on the way to the hospital.    LOS: 5 days    Geneieve Duell T 04/12/18Patient ID: Ian Bonus., male   DOB: May 22, 1927, 83 y.o.   MRN: [id number]

## 2016-06-29 DEATH — deceased

## 2016-07-29 ENCOUNTER — Ambulatory Visit: Payer: Medicare Other | Admitting: Rheumatology

## 2016-11-13 ENCOUNTER — Ambulatory Visit (INDEPENDENT_AMBULATORY_CARE_PROVIDER_SITE_OTHER): Payer: PRIVATE HEALTH INSURANCE | Admitting: Specialist

## 2017-01-10 IMAGING — CT CT ABD-PELV W/ CM
2 of 5 series · 16 of 46 positions shown, 18 images · IV contrast (ISOVUE)
Comparison: Plain film from earlier in the same day

CLINICAL DATA: Generalized abdominal pain

EXAM:
CT ABDOMEN AND PELVIS WITH CONTRAST
TECHNIQUE: Multidetector CT imaging of the abdomen and pelvis was performed
using the standard protocol following bolus administration of
intravenous contrast.
CONTRAST:  100 mL [id number]

[Series 2: abd/pel with · axial · 0.90mm/px · z∈[-438,-3]mm · 13 of 103 slices shown, 15 images]
[im 8/103  soft-tissue]
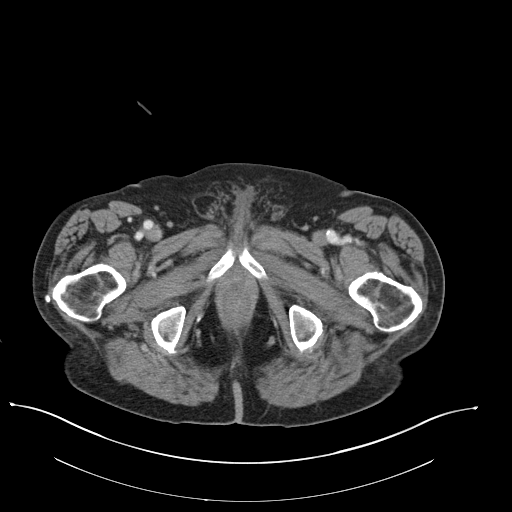
[im 8/103  bone]
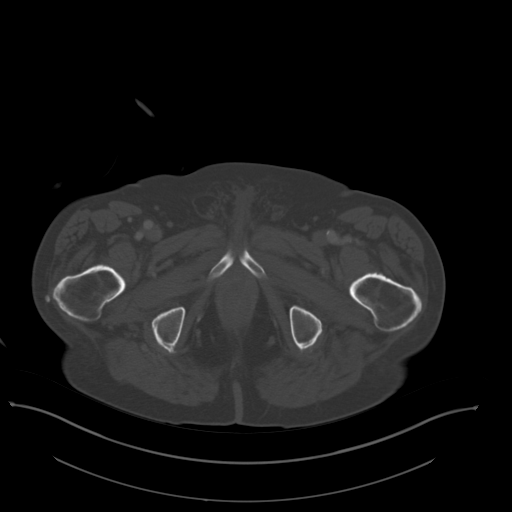
[im 15/103  soft-tissue]
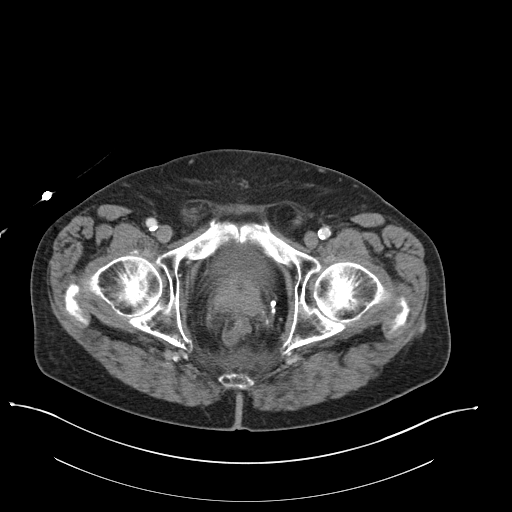
[im 22/103  soft-tissue]
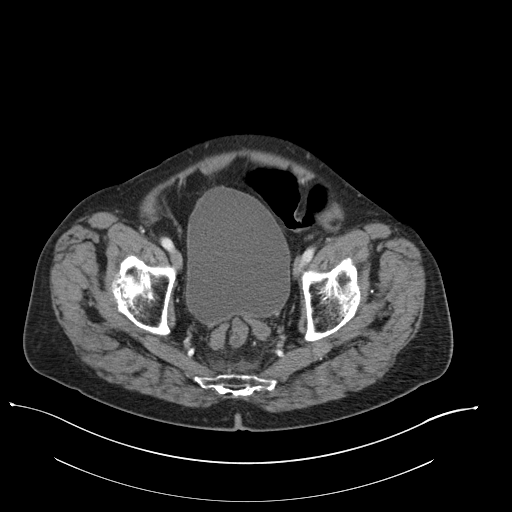
[im 30/103  soft-tissue]
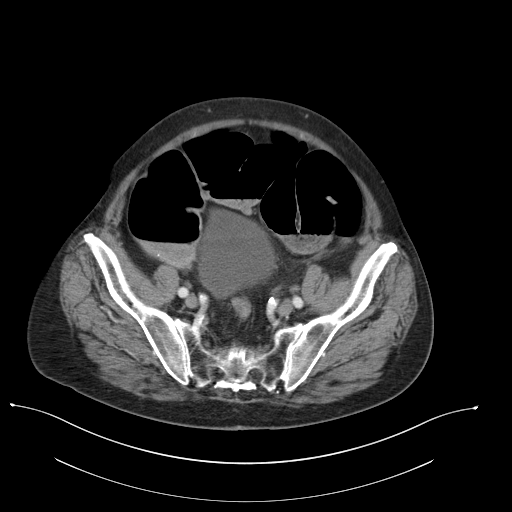
[im 37/103  soft-tissue]
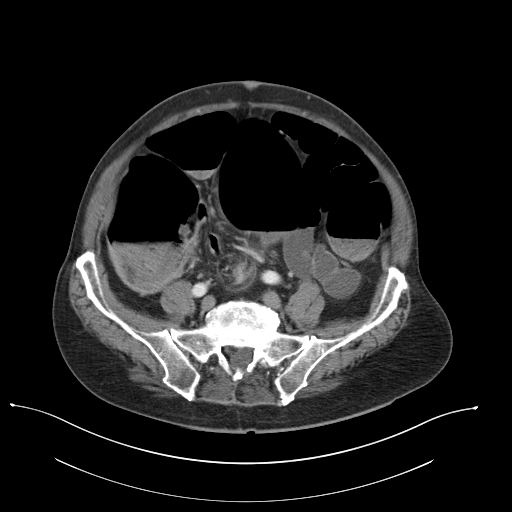
[im 44/103  soft-tissue]
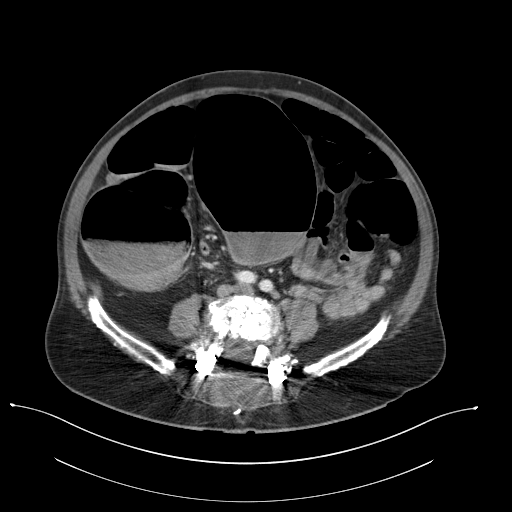
[im 52/103  soft-tissue]
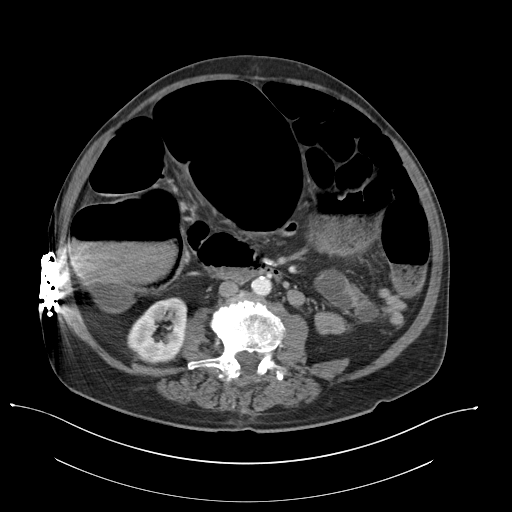
[im 59/103  soft-tissue]
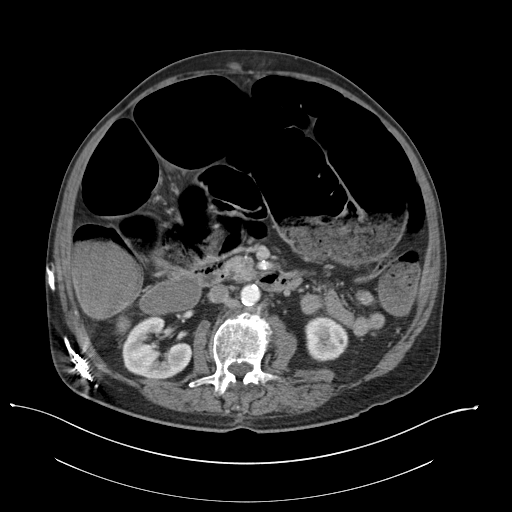
[im 66/103  soft-tissue]
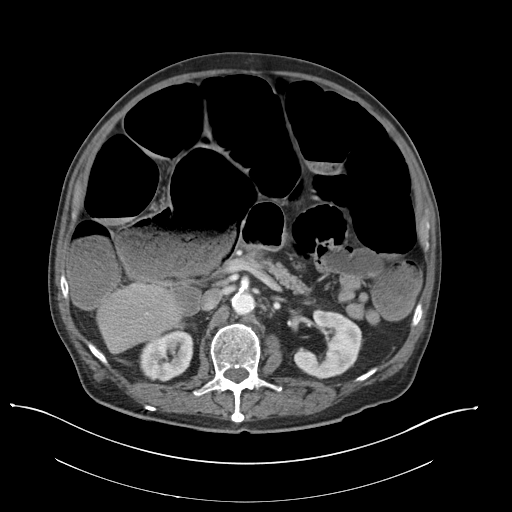
[im 66/103  bone]
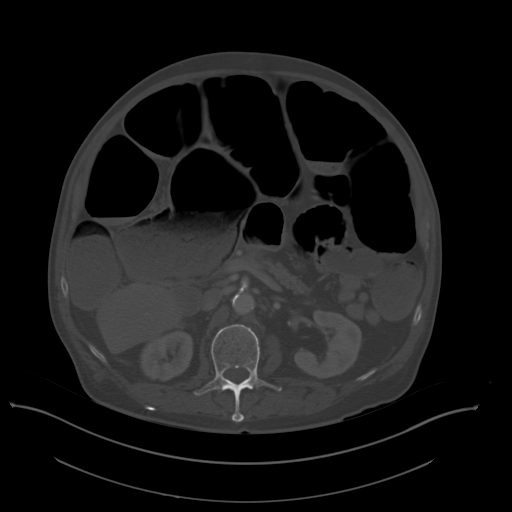
[im 73/103  soft-tissue]
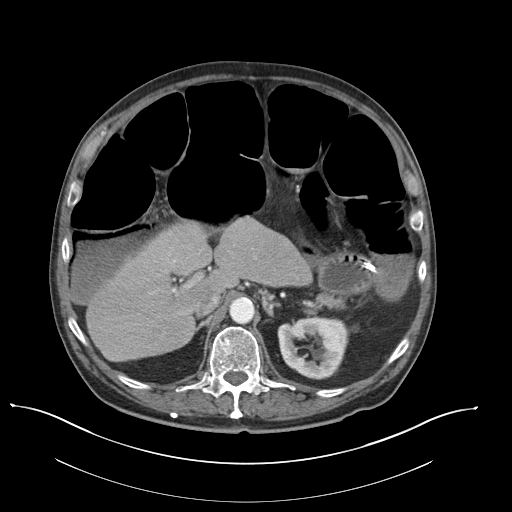
[im 81/103  soft-tissue]
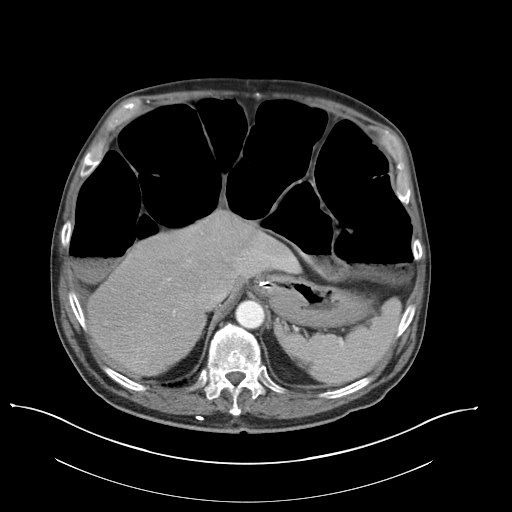
[im 88/103  soft-tissue]
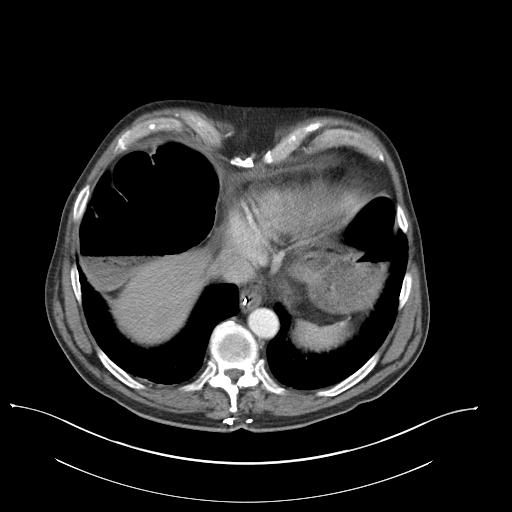
[im 95/103  soft-tissue]
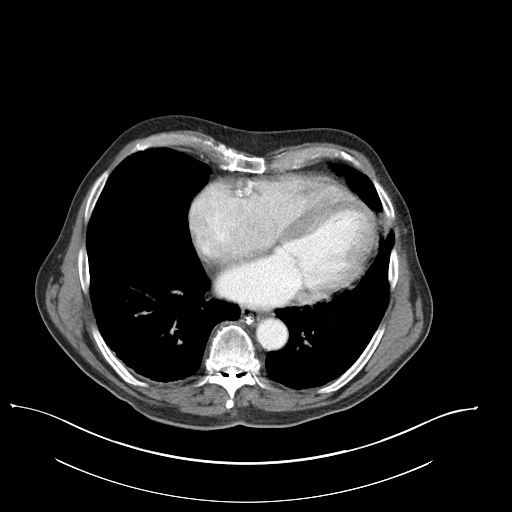

[Series 3: coronal a/|p · coronal · 0.79mm/px · 3 of 176 slices shown]
[im 59/176  soft-tissue]
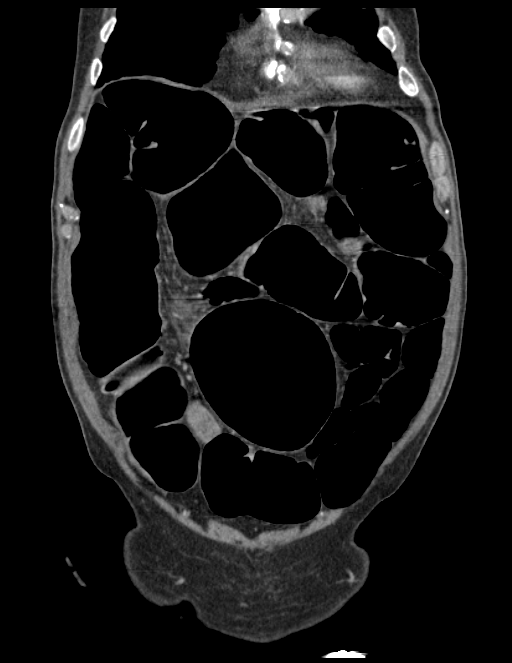
[im 78/176  soft-tissue]
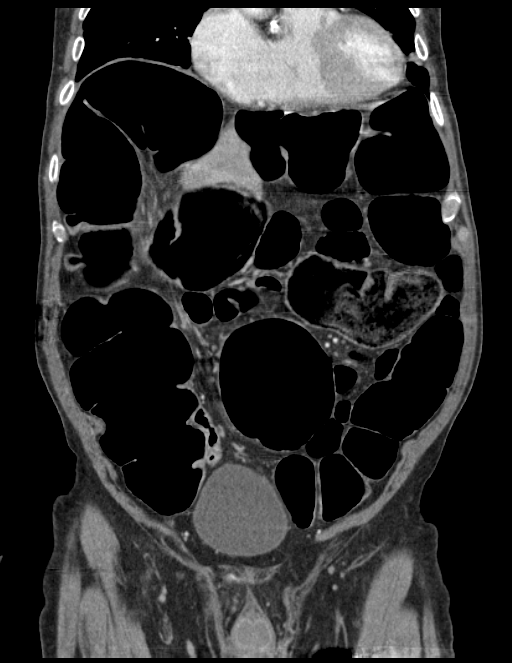
[im 98/176  soft-tissue]
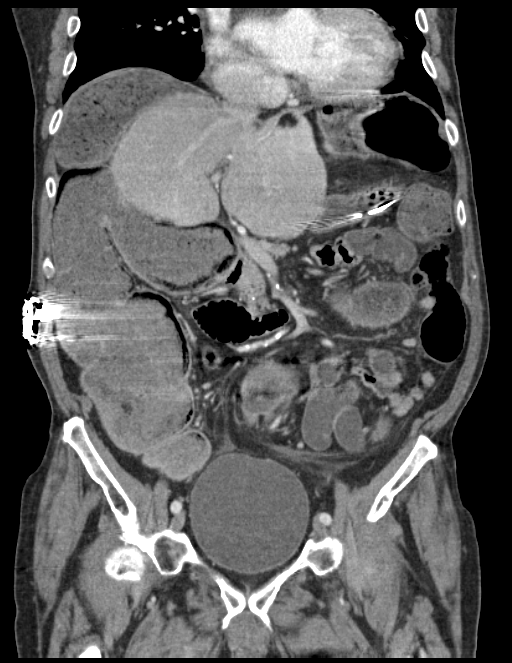

[16 of 46 positions shown; findings below may reference images not displayed]

FINDINGS: Lung bases demonstrate some minimal right basilar atelectatic
changes. No focal infiltrate or sizable effusion is noted.

The liver, gallbladder, spleen, adrenal glands and pancreas are
within normal limits. Kidneys are well visualized bilaterally with a
normal enhancement pattern. Delayed images demonstrate normal
excretion of contrast and small para pelvic cysts bilaterally.

A nasogastric catheter is noted coiled within the stomach. There is
significant distension of the colon identified with an abrupt
caliber change in the rectosigmoid with swirling vasculature
surrounding the area of narrowing consistent with sigmoid volvulus.
Significant redundancy of the sigmoid is noted. Small bowel as
visualized is within normal limits. No pneumatosis or free air is
identified at this time. The appendix is not visualize consistent
with a prior surgical history.

The bladder is well distended. No pelvic mass lesion or sidewall
abnormality is noted. No acute bony abnormality is noted.
Postsurgical changes in the lumbar spine are seen
IMPRESSION: Changes consistent with sigmoid volvulus with significant colonic
distention. No free air or pneumatosis is noted at this time.

Mild bibasilar atelectasis.

No other acute abnormality is noted.

Critical Value/emergent results were called by telephone at the time
of interpretation on 09/03/2015 at [DATE] to Dr. Nuris , who
verbally acknowledged these results.

## 2017-01-11 IMAGING — DX DG ABD PORTABLE 1V
2 series · 2 of 2 positions shown · non-contrast
Comparison: 09/04/2015 at [DATE] a.m.

CLINICAL DATA: Abdominal distension with sigmoid volvulus

EXAM:
PORTABLE ABDOMEN - 1 VIEW

[abdomen kub (1 of 2)]
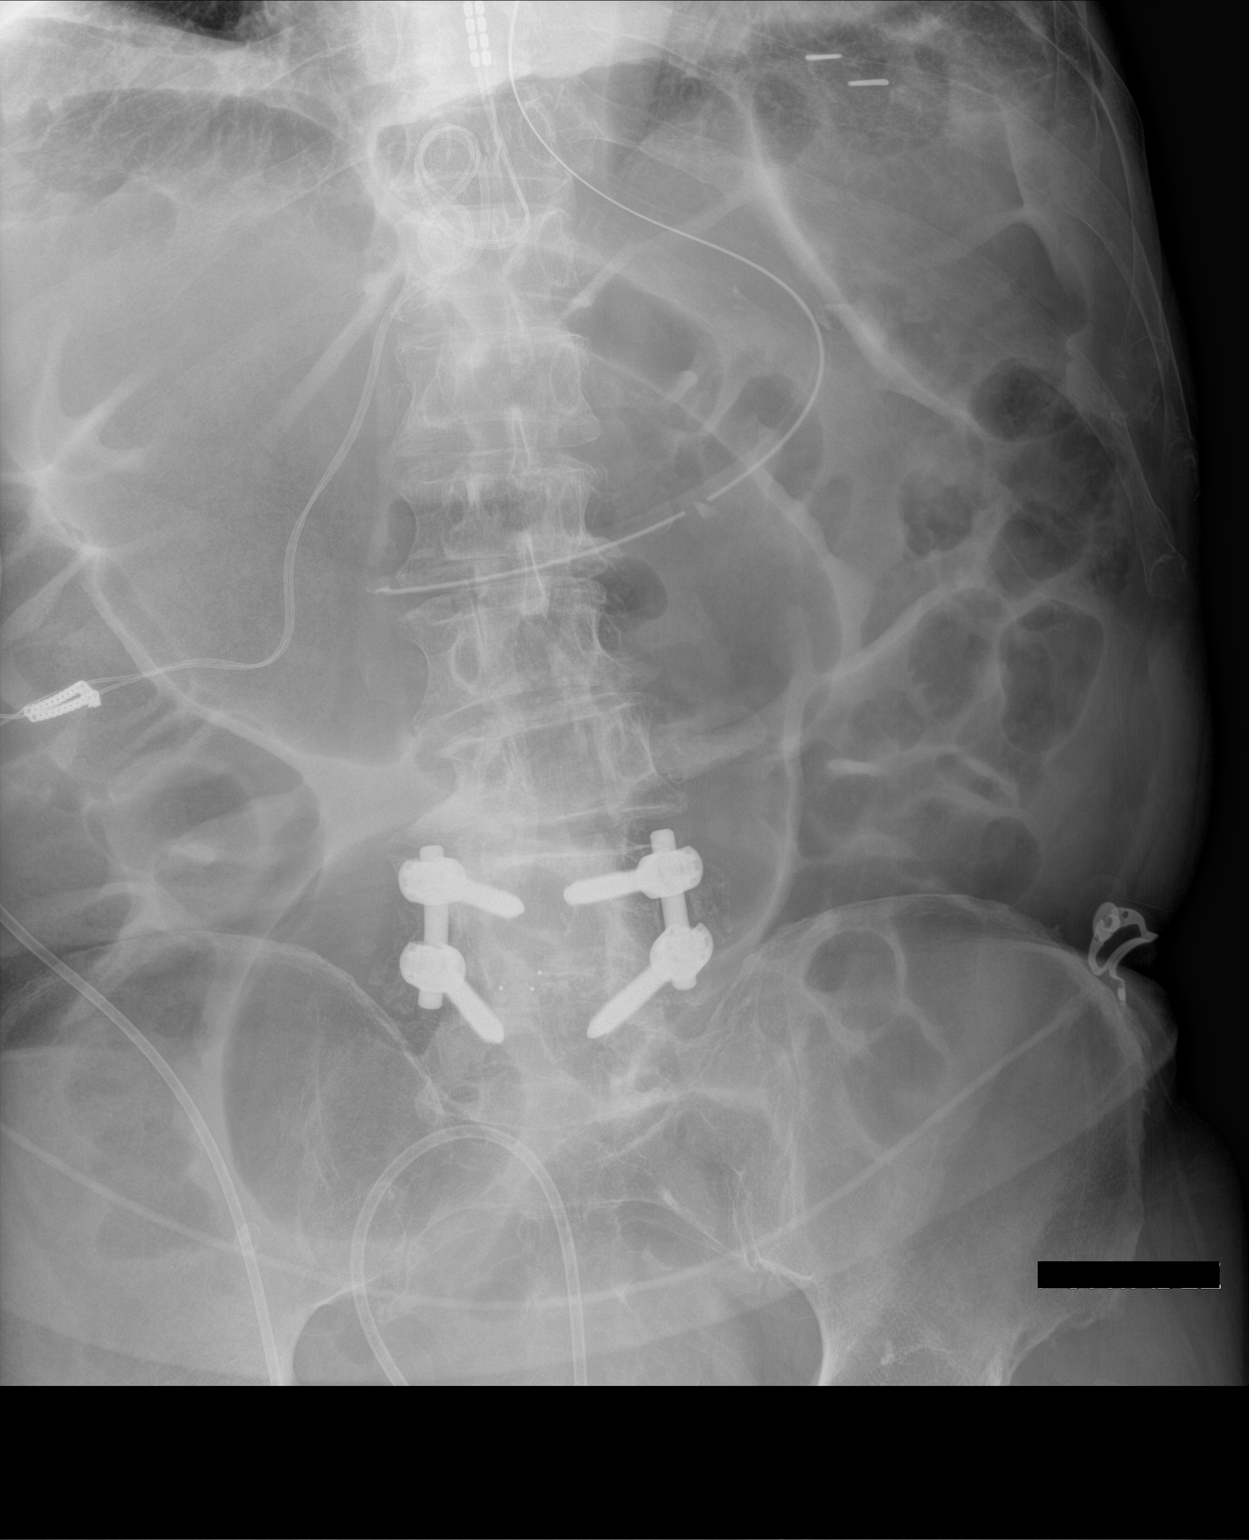

[abdomen kub (2 of 2)]
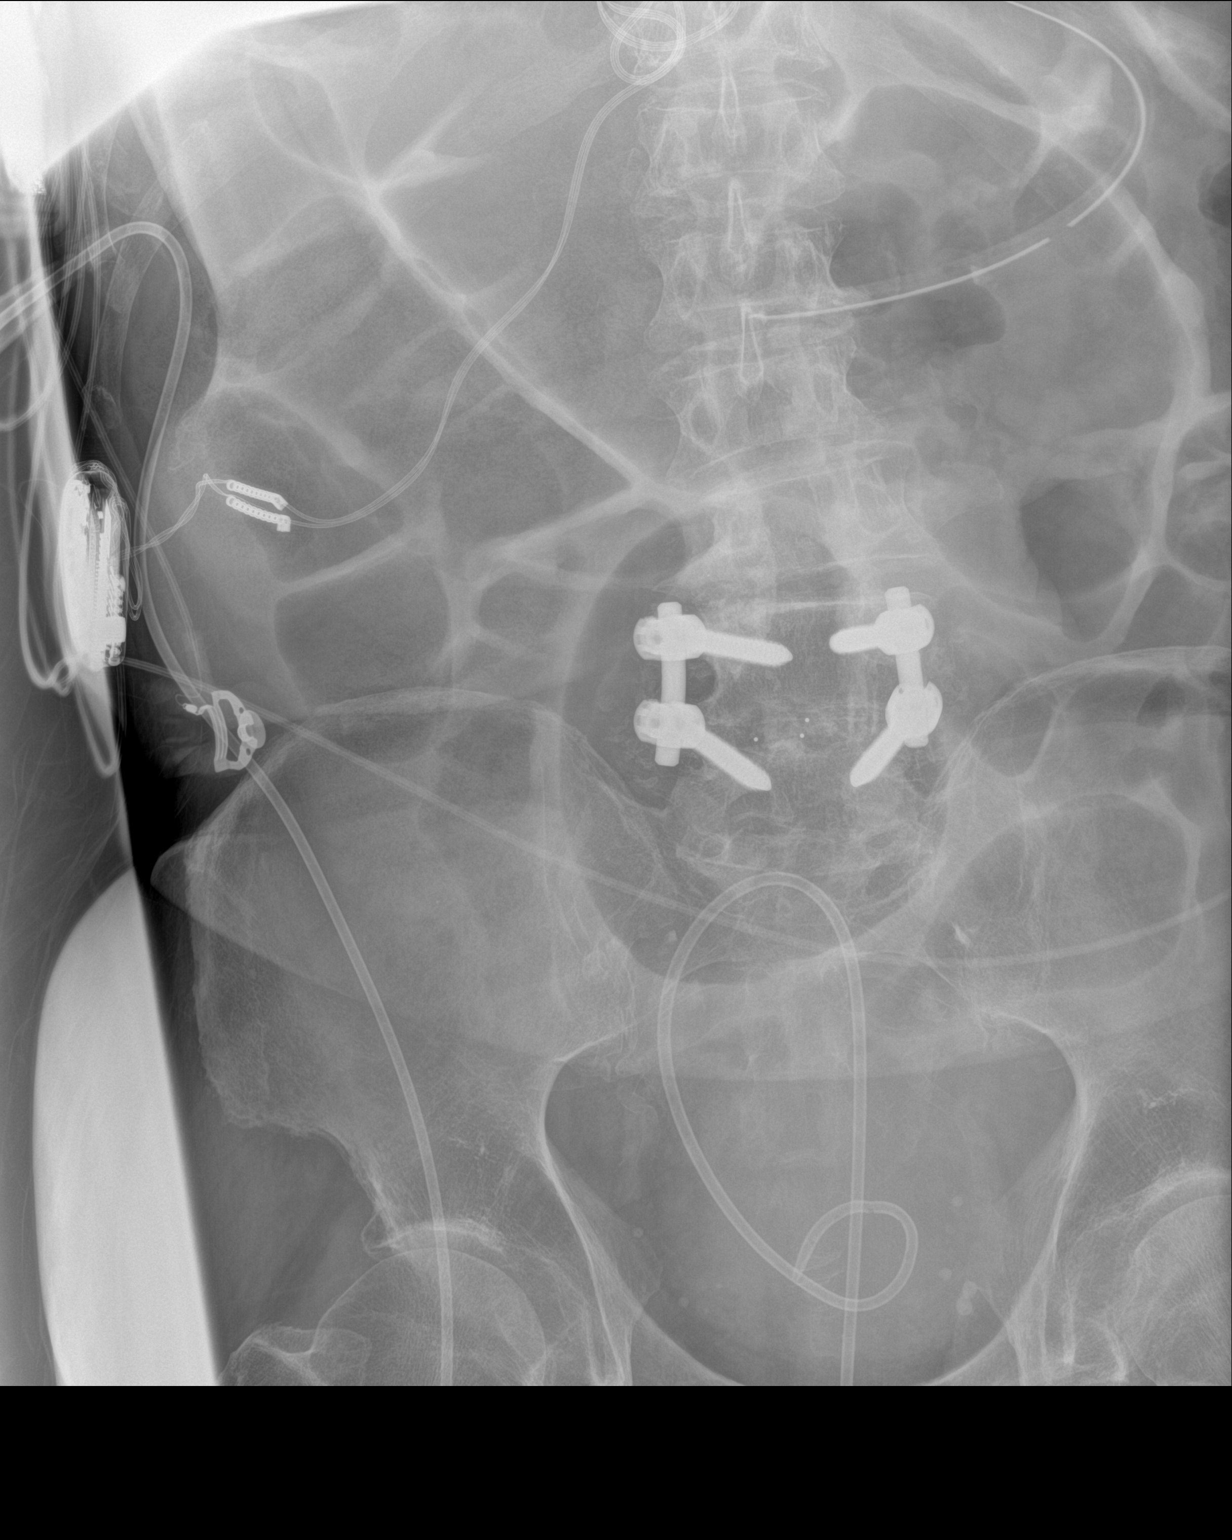

[2 of 2 positions shown; findings below may reference images not displayed]

FINDINGS: NG tube again projects over the stomach. No change in the position
of rectal catheter which projects with tip in the midline of the
inferior pelvis. Diffuse gaseous distention of small and large bowel
again identified including large bowel distention in the midline of
the mid to upper abdomen to a diameter of 13.5 cm.
IMPRESSION: No change in appearance when compared to prior study with
significant persistent gaseous distensionof the large bowel.

## 2017-01-11 IMAGING — CR DG ABDOMEN 1V
2 series · 2 of 2 positions shown · non-contrast
Comparison: 09/03/2015 CT

CLINICAL DATA: Sigmoidoscopy for sigmoid volvulus.

EXAM:
ABDOMEN - 1 VIEW

[t abdomen supine (1 of 2)]
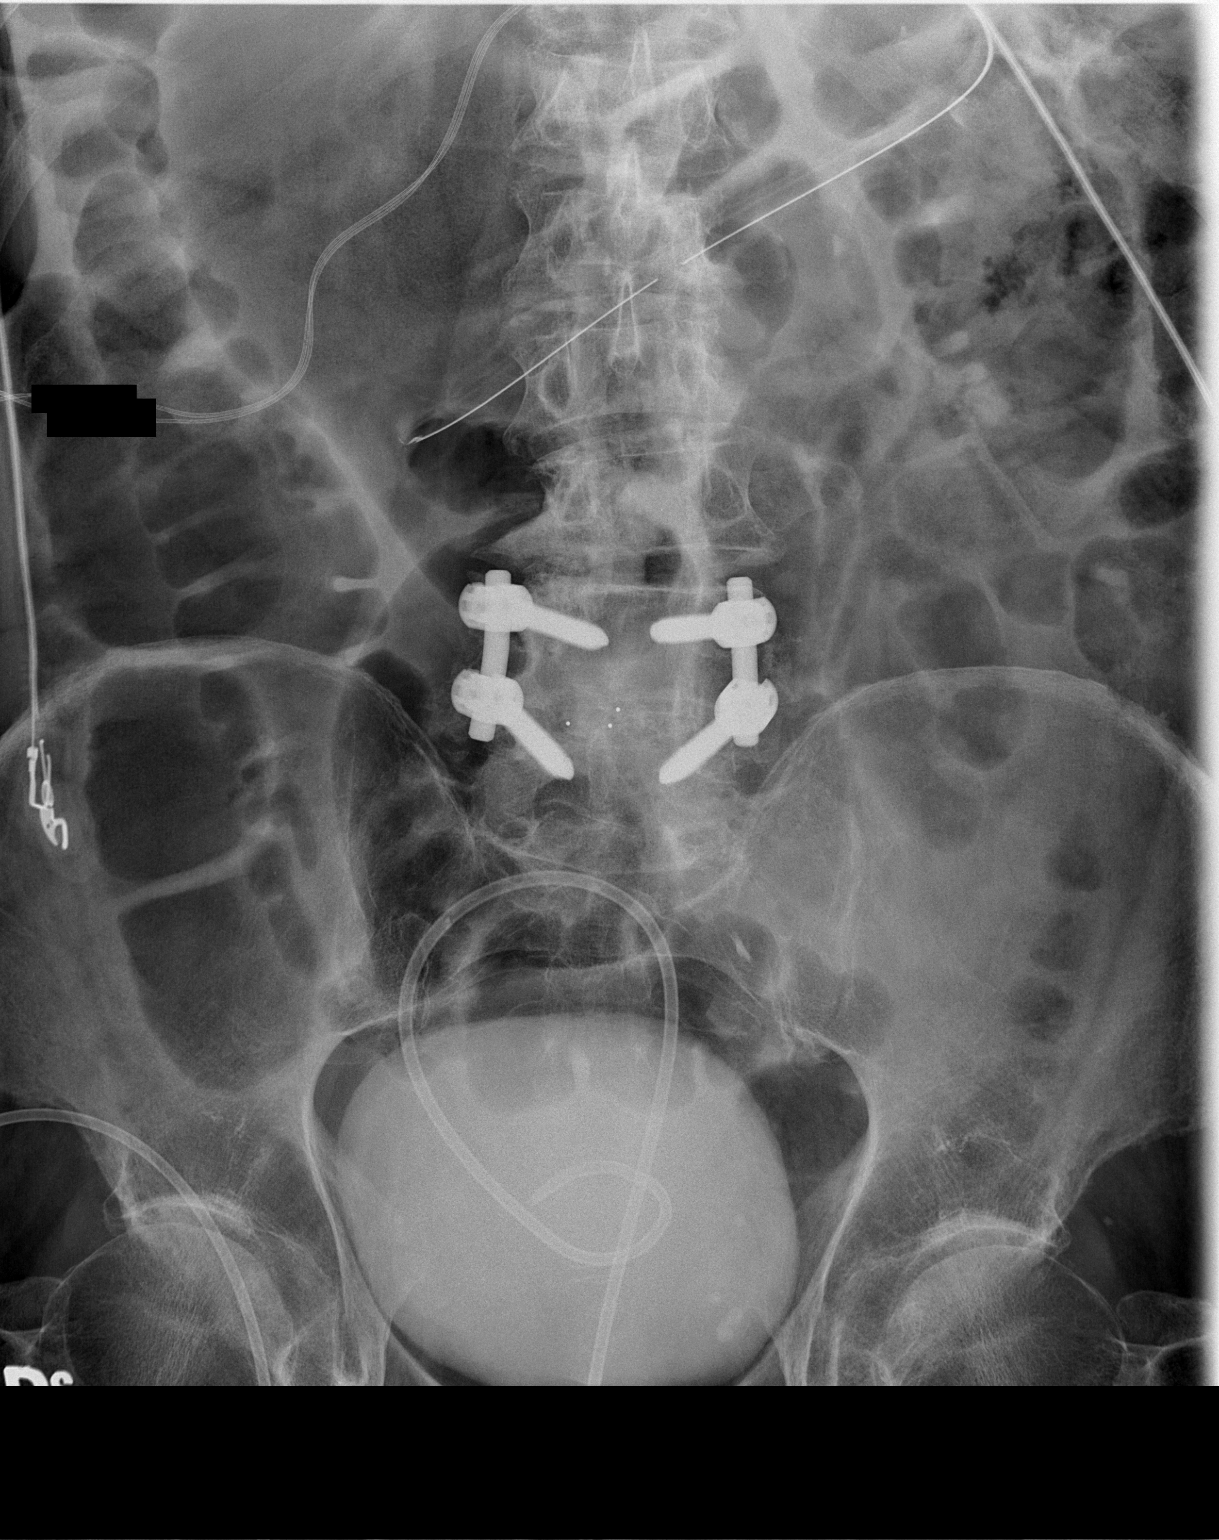

[t abdomen supine (2 of 2)]
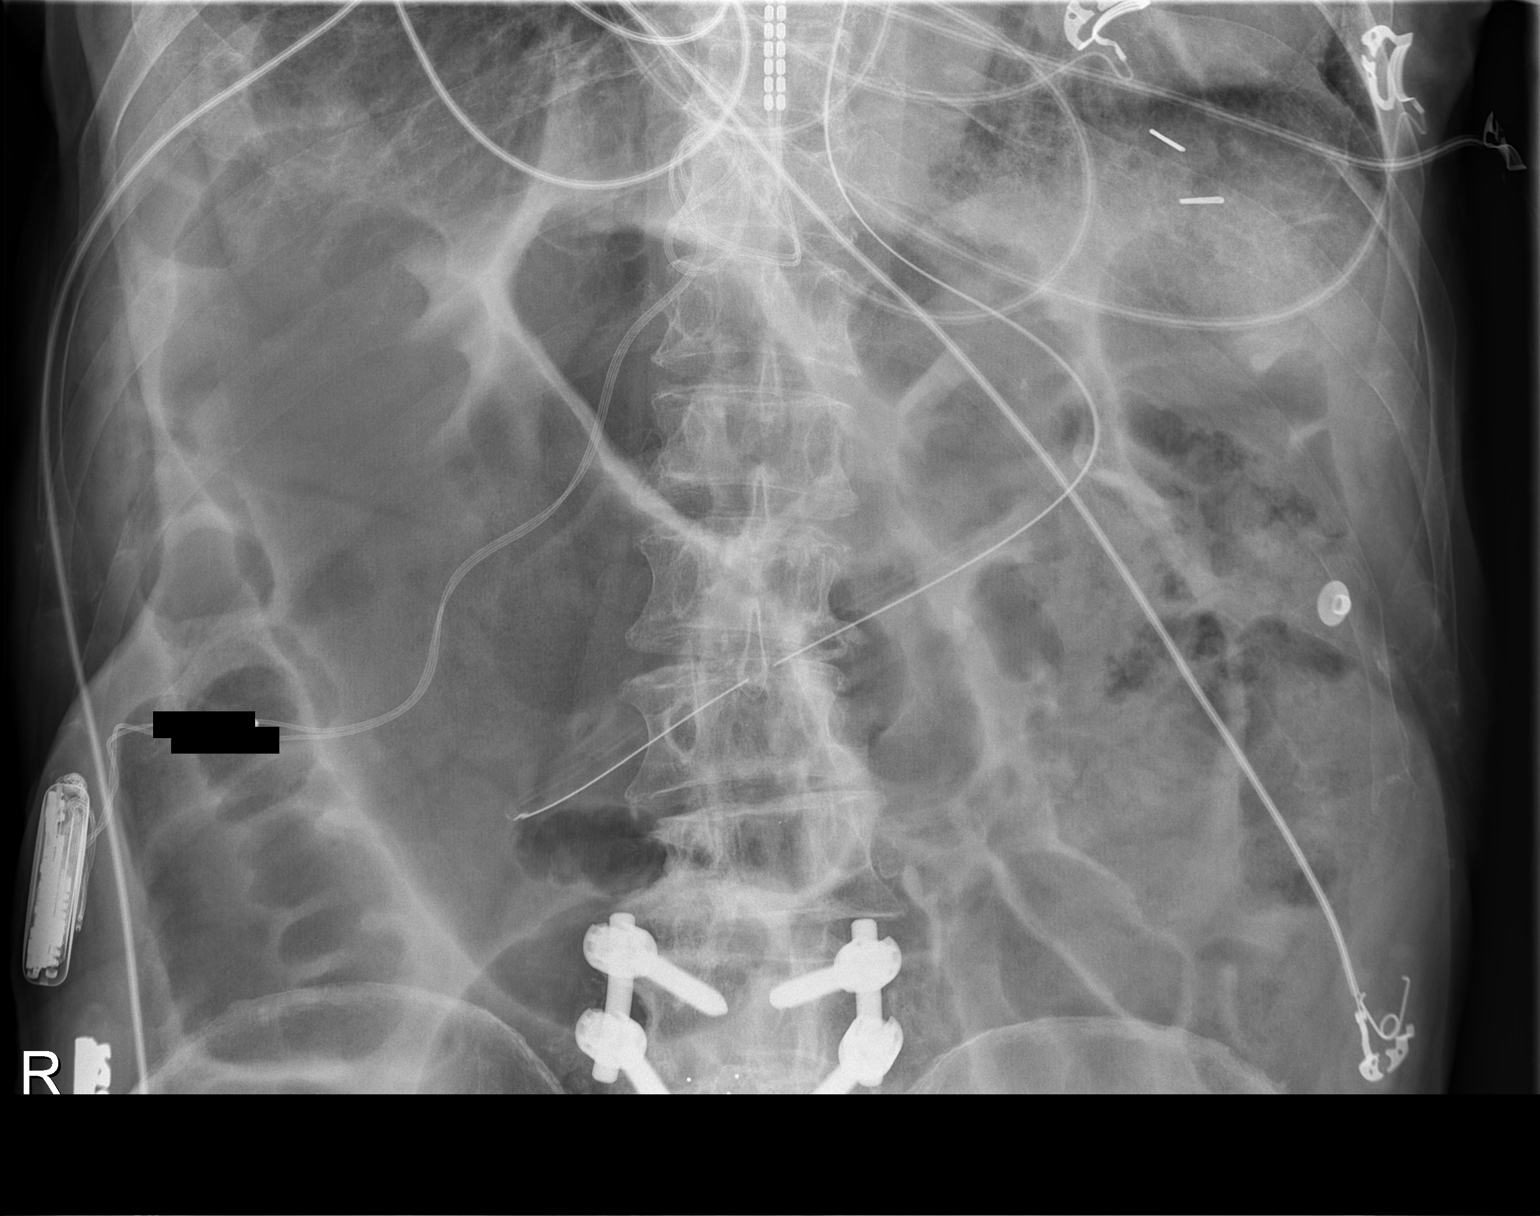

[2 of 2 positions shown; findings below may reference images not displayed]

FINDINGS: Pigtail catheter has been placed in the rectum. Distal colonic
distention has improved. Proximal colonic distention remains
prominent. No supine evidence of pneumoperitoneum. Nasogastric tube
with tip at the distal stomach.
IMPRESSION: After endoscopic decompression distal colonic distention is
improved. Proximal colonic distention remains prominent.
